# Patient Record
Sex: Female | Born: 1955 | Race: White | Hispanic: No | Smoking: Never smoker
Health system: Southern US, Community
[De-identification: ages and names within clinical notes are randomized; demographics above are authoritative.]

## PROBLEM LIST (undated history)

## (undated) DIAGNOSIS — E785 Hyperlipidemia, unspecified: Secondary | ICD-10-CM

## (undated) DIAGNOSIS — M545 Low back pain, unspecified: Secondary | ICD-10-CM

## (undated) DIAGNOSIS — E119 Type 2 diabetes mellitus without complications: Secondary | ICD-10-CM

## (undated) DIAGNOSIS — M79604 Pain in right leg: Secondary | ICD-10-CM

## (undated) DIAGNOSIS — M1990 Unspecified osteoarthritis, unspecified site: Secondary | ICD-10-CM

## (undated) DIAGNOSIS — R002 Palpitations: Secondary | ICD-10-CM

## (undated) DIAGNOSIS — I10 Essential (primary) hypertension: Secondary | ICD-10-CM

## (undated) DIAGNOSIS — K449 Diaphragmatic hernia without obstruction or gangrene: Secondary | ICD-10-CM

## (undated) DIAGNOSIS — Z9889 Other specified postprocedural states: Secondary | ICD-10-CM

## (undated) DIAGNOSIS — M549 Dorsalgia, unspecified: Secondary | ICD-10-CM

## (undated) DIAGNOSIS — T7840XA Allergy, unspecified, initial encounter: Secondary | ICD-10-CM

## (undated) DIAGNOSIS — K219 Gastro-esophageal reflux disease without esophagitis: Secondary | ICD-10-CM

## (undated) DIAGNOSIS — R112 Nausea with vomiting, unspecified: Secondary | ICD-10-CM

## (undated) DIAGNOSIS — M2550 Pain in unspecified joint: Secondary | ICD-10-CM

## (undated) HISTORY — DX: Essential (primary) hypertension: I10

## (undated) HISTORY — DX: Gastro-esophageal reflux disease without esophagitis: K21.9

## (undated) HISTORY — PX: COLONOSCOPY: SHX174

## (undated) HISTORY — DX: Dorsalgia, unspecified: M54.9

## (undated) HISTORY — DX: Low back pain: M54.5

## (undated) HISTORY — DX: Hyperlipidemia, unspecified: E78.5

## (undated) HISTORY — DX: Pain in unspecified joint: M25.50

## (undated) HISTORY — DX: Type 2 diabetes mellitus without complications: E11.9

## (undated) HISTORY — DX: Allergy, unspecified, initial encounter: T78.40XA

## (undated) HISTORY — DX: Palpitations: R00.2

---
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
## 1987-11-21 HISTORY — PX: TUBAL LIGATION: SHX77

## 1990-11-20 HISTORY — PX: BACK SURGERY: SHX140

## 1998-09-20 ENCOUNTER — Ambulatory Visit (HOSPITAL_COMMUNITY): Admission: RE | Admit: 1998-09-20 | Discharge: 1998-09-20 | Payer: Self-pay | Admitting: Family Medicine

## 1998-09-20 ENCOUNTER — Encounter: Payer: Self-pay | Admitting: Family Medicine

## 1998-11-20 HISTORY — PX: KNEE ARTHROSCOPY: SUR90

## 2001-04-23 ENCOUNTER — Other Ambulatory Visit: Admission: RE | Admit: 2001-04-23 | Discharge: 2001-04-23 | Payer: Self-pay | Admitting: *Deleted

## 2001-08-05 ENCOUNTER — Encounter: Admission: RE | Admit: 2001-08-05 | Discharge: 2001-11-03 | Payer: Self-pay | Admitting: Internal Medicine

## 2001-11-20 HISTORY — PX: CHOLECYSTECTOMY: SHX55

## 2002-04-10 ENCOUNTER — Encounter: Payer: Self-pay | Admitting: Family Medicine

## 2002-04-10 ENCOUNTER — Encounter: Admission: RE | Admit: 2002-04-10 | Discharge: 2002-04-10 | Payer: Self-pay | Admitting: Family Medicine

## 2002-07-08 ENCOUNTER — Encounter: Admission: RE | Admit: 2002-07-08 | Discharge: 2002-07-08 | Payer: Self-pay | Admitting: Internal Medicine

## 2002-07-08 ENCOUNTER — Encounter: Payer: Self-pay | Admitting: Internal Medicine

## 2002-08-07 ENCOUNTER — Encounter: Admission: RE | Admit: 2002-08-07 | Discharge: 2002-08-07 | Payer: Self-pay | Admitting: Family Medicine

## 2002-08-07 ENCOUNTER — Encounter: Payer: Self-pay | Admitting: Family Medicine

## 2004-10-05 ENCOUNTER — Encounter: Admission: RE | Admit: 2004-10-05 | Discharge: 2004-10-05 | Payer: Self-pay | Admitting: Allergy and Immunology

## 2004-10-16 ENCOUNTER — Observation Stay (HOSPITAL_COMMUNITY): Admission: EM | Admit: 2004-10-16 | Discharge: 2004-10-17 | Payer: Self-pay | Admitting: Emergency Medicine

## 2004-10-19 ENCOUNTER — Other Ambulatory Visit: Admission: RE | Admit: 2004-10-19 | Discharge: 2004-10-19 | Payer: Self-pay | Admitting: *Deleted

## 2004-12-29 ENCOUNTER — Observation Stay (HOSPITAL_COMMUNITY): Admission: RE | Admit: 2004-12-29 | Discharge: 2004-12-30 | Payer: Self-pay | Admitting: General Surgery

## 2004-12-29 ENCOUNTER — Encounter (INDEPENDENT_AMBULATORY_CARE_PROVIDER_SITE_OTHER): Payer: Self-pay | Admitting: *Deleted

## 2005-11-01 LAB — HM COLONOSCOPY

## 2005-12-04 ENCOUNTER — Other Ambulatory Visit: Admission: RE | Admit: 2005-12-04 | Discharge: 2005-12-04 | Payer: Self-pay | Admitting: *Deleted

## 2006-12-04 ENCOUNTER — Other Ambulatory Visit: Admission: RE | Admit: 2006-12-04 | Discharge: 2006-12-04 | Payer: Self-pay | Admitting: *Deleted

## 2007-12-18 ENCOUNTER — Other Ambulatory Visit: Admission: RE | Admit: 2007-12-18 | Discharge: 2007-12-18 | Payer: Self-pay | Admitting: *Deleted

## 2010-02-07 ENCOUNTER — Other Ambulatory Visit: Admission: RE | Admit: 2010-02-07 | Discharge: 2010-02-07 | Payer: Self-pay | Admitting: Family Medicine

## 2010-08-30 LAB — HM PAP SMEAR: HM Pap smear: NORMAL

## 2010-11-20 DIAGNOSIS — M79605 Pain in left leg: Secondary | ICD-10-CM

## 2010-11-20 DIAGNOSIS — M79604 Pain in right leg: Secondary | ICD-10-CM

## 2010-11-20 DIAGNOSIS — M545 Low back pain, unspecified: Secondary | ICD-10-CM

## 2010-11-20 HISTORY — DX: Pain in right leg: M79.604

## 2010-11-20 HISTORY — DX: Low back pain, unspecified: M54.50

## 2010-11-20 HISTORY — DX: Pain in left leg: M79.605

## 2011-03-16 LAB — HM MAMMOGRAPHY: HM Mammogram: NORMAL

## 2011-04-07 NOTE — Op Note (Signed)
Brianna Lambert, SILLIMAN NO.:  [id number]   MEDICAL RECORD NO.:  [id number]          PATIENT TYPE:  OBV   LOCATION:  0356                         FACILITY:  [facility]   PHYSICIAN:  Ollen Gross. Vernell Morgans, M.D. DATE OF BIRTH:  10/01/1956   DATE OF PROCEDURE:  12/29/2004  DATE OF DISCHARGE:  12/30/2004                                 OPERATIVE REPORT   PREOPERATIVE DIAGNOSIS:  Biliary dyskinesia.   POSTOPERATIVE DIAGNOSIS:  Cholecystitis.   OPERATION/PROCEDURE:  Laparoscopic cholecystectomy with intraoperative  cholangiogram.   SURGEON:  Ollen Gross. Carolynne Edouard, M.D.   ASSISTANT:  Anselm Pancoast. Zachery Dakins, M.D.   ANESTHESIA:  General endotracheal anesthesia.   DESCRIPTION OF PROCEDURE:  After informed consent was obtained, the patient  was brought to the operating room and placed in the supine position on the  operating room table.  After adequate induction of general anesthesia, the  patient's abdomen was prepped with Betadine and draped in the usual sterile  manner.  The area below the umbilicus was infiltrated with 0.25% Marcaine.   A small incision was made with the 15-blade knife.  This incision was  carried down through the subcutaneous tissue bluntly  with the Kelly clamp  and [employer] retractors until the linea alba was identified.  The linea  alba was incised with the 15-blade knife and each side was grasped with  Kocher clamps and elevated anteriorly.  The preperitoneal space was then  probed bluntly with a hemostat until the peritoneum was opened and access  was gained to the abdominal cavity.  A 0 Vicryl pursestring stitch was  placed in the fascia surrounding the opening. The Hasson cannula was placed  through the opening and anchored in place with the previously placed Vicryl  pursestring stitch.  The abdomen was then insufflated with carbon dioxide  without difficulty.  The patient was placed in the head-up position and  rotated slightly with the right side up.  The  epigastric region was then  infiltrated with 0.25% Marcaine.  A small incision was made with the 15-  blade knife and a 10 mm port was placed bluntly through this incision into  the abdominal cavity under direct vision.  Sites were then chosen laterally  on the right side of the abdomen with placement of a 5 mm ports.  Each of  these areas was infiltrated with 0.25% Marcaine.  Small stab incisions were  made with 15-blade knife and 5 mm ports were placed bluntly through these  incisions into the abdominal cavity under direct vision.  A blunt grasper  was placed through the lateral-most 5 mm port and used to grasp the dome of  the gallbladder and elevate it anteriorly and superiorly.  Some omental  adhesions to the body of the gallbladder was taken down bluntly with  dissector.  Another blunt grasper was placed in the other 5 mm port and used  to retract on the body and neck of the gallbladder.  The dissector was  placed through the epigastric port and using the electrocautery, the  peritoneal reflection at the gallbladder neck area was opened  sharply.  Blunt dissection was then carried out in this area until the gallbladder  neck-cystic duct junction was readily identified and a good window was  created.  A single clip was placed proximally on the gallbladder neck.  A  small ductotomy was made just below the clip.  A 14-gauge angiocath was  placed percutaneously through the anterior abdominal wall under direct  visualization.  A Reddick cholangiogram catheter was placed through the  angiocath and flushed.  The Reddick catheter was then placed within the  cystic duct and anchored in place with a clip.  A cholangiogram was obtained  that showed no filling defects, good emptying into the duodenum and adequate  length on the cystic duct.  The anchoring clip and catheters were then  removed from the patient.  Three clips were placed proximally on the cystic  duct and the duct was divided between  the two sets of clips.  Posterior to  this the cystic artery was identified and again dissected in a  circumferential manner bluntly until a good window was created.  Two clips  were placed proximally and one distally on the artery and the artery was  divided between the two.  Next, the laparoscopic hook cautery device was  used to separate the gallbladder from the liver bed.  Prior to completely  detaching the gallbladder from the liver bed, the liver bed was inspected  and several small bleeding points were coagulated with the electrocautery  until the area was completely hemostatic.  The gallbladder was then detached  the rest of the way from the liver bed without difficulty.  The laparoscopic  was then moved to the epigastric port and a gallbladder grasper was placed  through the Hasson cannula and used to grasp the neck of the gallbladder.  The gallbladder was then removed with the Hasson cannula through the  infraumbilical port without difficulty.  The fascial defect was closed with  the previously placed 0 Vicryl pursestring stitch as well as with another  interrupted 0 Vicryl stitch.  The abdomen was then irrigated with copious  amounts of saline until the effluent was clear.  The rest of the ports were  removed under direct vision and gas was allowed to escape.  The skin  incisions were all closed with interrupted 4-0 Monocryl subcuticular  stitches.  Benzoin and Steri-Strips and sterile dressings were applied.  The  patient tolerated the procedure well.  At the end of the case all needle,  sponge and instrument counts were correct.  The patient was then awakened  and taken to the recovery room in stable condition.      PST/MEDQ  D:  01/01/2005  T:  01/02/2005  Job:  [id number]

## 2011-04-07 NOTE — H&P (Signed)
Brianna Lambert, Brianna Lambert NO.:  [id number]   MEDICAL RECORD NO.:  [id number]          PATIENT TYPE:  INP   LOCATION:  1825                         FACILITY:  [facility]   PHYSICIAN:  Candyce Churn, M.D.DATE OF BIRTH:  1956/10/22   DATE OF ADMISSION:  10/16/2004  DATE OF DISCHARGE:                                HISTORY & PHYSICAL   CHIEF COMPLAINT:  Abdominal pain and right flank pain.   HISTORY OF PRESENT ILLNESS:  The patient is a very pleasant 55 year old  female with a history of greater than 24 hours of lower abdominal right  flank pain, which has been quite severe.  She is admitted now for pain  control and further workup.  She has a history of ruptured ovarian cyst and  also has had a renal calculous in the past.  She had an abdominal  ultrasound, which showed a fatty liver but otherwise was relatively  unrevealing.  She initially had pain in her right upper quadrant, but now it  is more centered in the right lower quadrant.  She denies any obvious chills  but thinks that she may have had some mild fever.  She has not had any gross  hematuria.  She just can't get comfortable.  She has been in the emergency  room now for 6 to 8 hours.   PAST MEDICAL HISTORY:  1.  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 Lacretia Nicks.  2.  Asthma.  3.  GERD.  4.  Obesity.  5.  History of ovarian cyst with rupture.  6.  History of renal calculous times one.  7.  History of recent chest tightness syndrome with palpitations, being      worked up by Dr. Dewayne Hatch __________ .  Has a stress Cardiolite scheduled for      10/28/2004.   PAST SURGICAL HISTORY:  1.  C-section times two.  2.  Knee surgery.  3.  L5-S1 disk surgery per Dr. Renae Fickle.  4.  Tonsillectomy and adenoidectomy.  5.  Uterine prolapse surgery.   ALLERGIES:  No known allergies to medications.   MEDICATIONS:  1.  Singulair 10 mg a day.  2.  Zyrtec 10 mg a day.  3.  Nexium 40 mg a day.   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  Has two  children, ages 21 and 24 years old, who are healthy.  She does not smoke or  drink.   FAMILY HISTORY:  Atrial fibrillation in mother.  Father had aortic valve  replacement and diabetes mellitus.   REVIEW OF SYSTEMS:  Her flank pain is pleuritic on the right.  She has had  some nausea and has vomited a couple of times.  No diarrhea.  No chest pain,  shortness of breath, or chills.  The pain is not particularly positional.   PHYSICAL EXAMINATION:  GENERAL:  An obese female complaining of right flank  pain.  VITAL SIGNS:  Temperature 97.  Blood pressure 115/67.  Pulse 65 and regular.  Respiratory rate 16 and unlabored.  HEENT:  Atraumatic and normocephalic.  Oropharynx moist.  Tragal nerves  intact.  NECK:  Supple without JVD.  CHEST:  Clear to auscultation.  CARDIAC:  Regular rhythm.  No murmurs or gallops.  ABDOMEN:  Soft and tender.  Some mild tenderness in all quadrants diffusely  but particularly in the right lower quadrant.  No obvious rebound.  She is  obese.  Bowel sounds are positive.  The patient has had 2 mg of Dilaudid  times two and Toradol 30 mg IV times one at my examination.  EXTREMITIES:  Without cyanosis or edema.  There are no painful cords.  Negative Homan signs bilaterally.  NEUROLOGIC:  Alert and oriented times three.  Nonfocal.  Moves all  extremities well.  She is able to give an excellent history.   LABORATORY DATA:  White count 11,500, hemoglobin 12.7, platelet count  362,000, MCV 80, and 77% neutrophils on white cell differential.  D-dimer  within normal limits at 0.28.  LFTs normal.  Sodium 137, potassium 4.1,  chloride 107, bicarb 23, BUN 10, creatinine 0.8, glucose 109.  Urinalysis  with 40 ketones, otherwise negative.  Lipase normal at 25.  Abdominal  ultrasound reveals fatty liver; otherwise within normal limits.   ASSESSMENT:  A 55 year old female with abdominal pain that is more localized  now to  the right lower quadrant.  She also has some mild right flank pain  with inspiration.  This seems to be better with Toradol.  Differential  diagnosis includes appendicitis, ovarian cyst rupture, or renal calculous.  Doubt she has diverticulitis.   PLAN:  Treat with IV fluids.  Check abdominal and pelvic CT.  Will use  Toradol 60 mg IV q. 8h. p.r.n. and Dilaudid p.r.n.  Will continue clinical  follow up and consider surgical consult.      Robe   RNG/MEDQ  D:  10/16/2004  T:  10/16/2004  Job:  [id number]   cc:   Gretta Arab. Valentina Lucks, M.D.  [address] [place]  [place] [address]  Fax: [phone]

## 2011-09-13 ENCOUNTER — Ambulatory Visit (INDEPENDENT_AMBULATORY_CARE_PROVIDER_SITE_OTHER): Payer: 59 | Admitting: Internal Medicine

## 2011-09-13 ENCOUNTER — Encounter: Payer: Self-pay | Admitting: Internal Medicine

## 2011-09-13 VITALS — BP 118/74 | HR 75 | Temp 98.2°F | Resp 16 | Wt 278.0 lb

## 2011-09-13 DIAGNOSIS — IMO0002 Reserved for concepts with insufficient information to code with codable children: Secondary | ICD-10-CM

## 2011-09-13 DIAGNOSIS — Z01818 Encounter for other preprocedural examination: Secondary | ICD-10-CM

## 2011-09-13 DIAGNOSIS — M5416 Radiculopathy, lumbar region: Secondary | ICD-10-CM

## 2011-09-13 DIAGNOSIS — Z136 Encounter for screening for cardiovascular disorders: Secondary | ICD-10-CM

## 2011-09-13 DIAGNOSIS — Z23 Encounter for immunization: Secondary | ICD-10-CM

## 2011-09-13 NOTE — Patient Instructions (Signed)

## 2011-09-17 ENCOUNTER — Encounter: Payer: Self-pay | Admitting: Internal Medicine

## 2011-09-17 NOTE — Assessment & Plan Note (Signed)
Her EKG is normal and her exam is normal, I will check routine labs on her and let her surgeon know what I think her surgical risk is

## 2011-09-17 NOTE — Progress Notes (Signed)
  Subjective:    Patient ID: Brianna Lambert, female    DOB: 04-11-1956, 55 y.o.   MRN: [id number]  HPI New to me she needs to have a physical and a pre-op exam prior to undergoing L3L4 discectomy with Dr. Guinevere Scarlet. Other than low back pain she has no complaints today.    Review of Systems  Constitutional: Negative for fever, chills, diaphoresis, activity change, appetite change, fatigue and unexpected weight change.  HENT: Negative.   Eyes: Negative.   Respiratory: Negative for apnea, cough, choking, chest tightness, shortness of breath, wheezing and stridor.   Cardiovascular: Negative for chest pain, palpitations and leg swelling.  Gastrointestinal: Negative for nausea, vomiting, abdominal pain, diarrhea, constipation, blood in stool and anal bleeding.  Genitourinary: Negative.   Musculoskeletal: Positive for back pain. Negative for myalgias, joint swelling, arthralgias and gait problem.  Skin: Negative for color change, pallor, rash and wound.  Neurological: Negative.   Hematological: Negative for adenopathy. Does not bruise/bleed easily.  Psychiatric/Behavioral: Negative.        Objective:   Physical Exam  Vitals reviewed. Constitutional: She is oriented to person, place, and time. She appears well-developed and well-nourished. No distress.  HENT:  Head: Normocephalic and atraumatic.  Mouth/Throat: Oropharynx is clear and moist. No oropharyngeal exudate.  Eyes: Conjunctivae are normal. Right eye exhibits no discharge. Left eye exhibits no discharge. No scleral icterus.  Neck: Normal range of motion. Neck supple. No JVD present. No tracheal deviation present. No thyromegaly present.  Cardiovascular: Normal rate, regular rhythm, normal heart sounds and intact distal pulses.  Exam reveals no gallop and no friction rub.   No murmur heard. Pulmonary/Chest: Effort normal and breath sounds normal. No stridor. No respiratory distress. She has no wheezes. She has no rales. She exhibits  no tenderness.  Abdominal: Soft. Bowel sounds are normal. She exhibits no distension. There is no tenderness. There is no rebound and no guarding.  Musculoskeletal: Normal range of motion. She exhibits no edema and no tenderness.  Lymphadenopathy:    She has no cervical adenopathy.  Neurological: She is oriented to person, place, and time. She displays normal reflexes. She exhibits normal muscle tone. Coordination normal.  Skin: Skin is warm and dry. No rash noted. She is not diaphoretic. No erythema. No pallor.  Psychiatric: She has a normal mood and affect. Her behavior is normal. Judgment and thought content normal.          Assessment & Plan:

## 2011-09-18 ENCOUNTER — Other Ambulatory Visit (INDEPENDENT_AMBULATORY_CARE_PROVIDER_SITE_OTHER): Payer: 59

## 2011-09-18 ENCOUNTER — Encounter: Payer: Self-pay | Admitting: Internal Medicine

## 2011-09-18 DIAGNOSIS — M5416 Radiculopathy, lumbar region: Secondary | ICD-10-CM

## 2011-09-18 DIAGNOSIS — Z01818 Encounter for other preprocedural examination: Secondary | ICD-10-CM

## 2011-09-18 DIAGNOSIS — IMO0002 Reserved for concepts with insufficient information to code with codable children: Secondary | ICD-10-CM

## 2011-09-18 LAB — CBC WITH DIFFERENTIAL/PLATELET
Basophils Absolute: 0 10*3/uL (ref 0.0–0.1)
Eosinophils Absolute: 0.1 10*3/uL (ref 0.0–0.7)
Lymphocytes Relative: 23.8 % (ref 12.0–46.0)
MCHC: 34.3 g/dL (ref 30.0–36.0)
MCV: 89.2 fl (ref 78.0–100.0)
Monocytes Absolute: 0.8 10*3/uL (ref 0.1–1.0)
Neutrophils Relative %: 61.3 % (ref 43.0–77.0)
Platelets: 248 10*3/uL (ref 150.0–400.0)
RBC: 4.76 Mil/uL (ref 3.87–5.11)
RDW: 13.6 % (ref 11.5–14.6)

## 2011-09-18 LAB — COMPREHENSIVE METABOLIC PANEL
ALT: 22 U/L (ref 0–35)
AST: 23 U/L (ref 0–37)
Albumin: 3.7 g/dL (ref 3.5–5.2)
Alkaline Phosphatase: 50 U/L (ref 39–117)
BUN: 17 mg/dL (ref 6–23)
Calcium: 8.9 mg/dL (ref 8.4–10.5)
Chloride: 105 mEq/L (ref 96–112)
Potassium: 4.2 mEq/L (ref 3.5–5.1)
Sodium: 139 mEq/L (ref 135–145)

## 2011-09-18 LAB — URINALYSIS, ROUTINE W REFLEX MICROSCOPIC
Bilirubin Urine: NEGATIVE
Nitrite: NEGATIVE
Total Protein, Urine: NEGATIVE
Urine Glucose: NEGATIVE
pH: 6 (ref 5.0–8.0)

## 2011-10-04 ENCOUNTER — Encounter: Payer: Self-pay | Admitting: Internal Medicine

## 2011-10-23 ENCOUNTER — Other Ambulatory Visit: Payer: Self-pay | Admitting: Orthopedic Surgery

## 2011-10-26 ENCOUNTER — Encounter (HOSPITAL_COMMUNITY): Payer: Self-pay

## 2011-10-27 ENCOUNTER — Encounter (HOSPITAL_COMMUNITY)
Admission: RE | Admit: 2011-10-27 | Discharge: 2011-10-27 | Disposition: A | Discharge status: Home / Self Care | Payer: 59 | Source: Ambulatory Visit | Attending: Orthopedic Surgery | Admitting: Orthopedic Surgery

## 2011-10-27 ENCOUNTER — Encounter (HOSPITAL_COMMUNITY): Payer: Self-pay

## 2011-10-27 ENCOUNTER — Other Ambulatory Visit: Payer: Self-pay

## 2011-10-27 HISTORY — DX: Nausea with vomiting, unspecified: R11.2

## 2011-10-27 HISTORY — DX: Low back pain, unspecified: M54.50

## 2011-10-27 HISTORY — DX: Low back pain: M54.5

## 2011-10-27 HISTORY — DX: Pain in right leg: M79.604

## 2011-10-27 HISTORY — DX: Nausea with vomiting, unspecified: Z98.890

## 2011-10-27 HISTORY — DX: Unspecified osteoarthritis, unspecified site: M19.90

## 2011-10-27 LAB — CBC
HCT: 43.2 % (ref 36.0–46.0)
Hemoglobin: 15 g/dL (ref 12.0–15.0)
MCV: 87.6 fL (ref 78.0–100.0)
RBC: 4.93 MIL/uL (ref 3.87–5.11)
RDW: 13.1 % (ref 11.5–15.5)
WBC: 10.5 10*3/uL (ref 4.0–10.5)

## 2011-10-27 LAB — COMPREHENSIVE METABOLIC PANEL
Alkaline Phosphatase: 61 U/L (ref 39–117)
BUN: 16 mg/dL (ref 6–23)
CO2: 28 mEq/L (ref 19–32)
Chloride: 99 mEq/L (ref 96–112)
Creatinine, Ser: 0.71 mg/dL (ref 0.50–1.10)
GFR calc Af Amer: >90 mL/min (ref >90)
GFR calc non Af Amer: >90 mL/min (ref >90)
Glucose, Bld: 91 mg/dL (ref 70–99)
Total Bilirubin: 0.5 mg/dL (ref 0.3–1.2)

## 2011-10-27 LAB — URINE MICROSCOPIC-ADD ON

## 2011-10-27 LAB — DIFFERENTIAL
Eosinophils Absolute: 0.2 10*3/uL (ref 0.0–0.7)
Lymphocytes Relative: 29 % (ref 12–46)
Lymphs Abs: 3 10*3/uL (ref 0.7–4.0)
Monocytes Absolute: 0.5 10*3/uL (ref 0.1–1.0)
Neutrophils Relative %: 63 % (ref 43–77)

## 2011-10-27 LAB — URINALYSIS, ROUTINE W REFLEX MICROSCOPIC
Bilirubin Urine: NEGATIVE
Glucose, UA: NEGATIVE mg/dL
Ketones, ur: NEGATIVE mg/dL
Nitrite: NEGATIVE
Specific Gravity, Urine: 1.012 (ref 1.005–1.030)
pH: 6.5 (ref 5.0–8.0)

## 2011-10-27 LAB — PROTIME-INR
INR: 0.97 (ref 0.00–1.49)
Prothrombin Time: 13.1 seconds (ref 11.6–15.2)

## 2011-10-27 LAB — ABO/RH: ABO/RH(D): A NEG

## 2011-10-27 NOTE — Pre-Procedure Instructions (Signed)
20 Brianna Lambert  10/27/2011   Your procedure is scheduled on:  Thurs, Dec 13  Report to [facility] at 0530 AM.  Call this number if you have problems the morning of surgery: [phone]   Remember:   Do not eat food:After Midnight.  May have clear liquids: up to 4 Hours before arrival.  Clear liquids include soda, tea, black coffee, apple or grape juice, broth.  Take these medicines the morning of surgery with A SIP OF WATER: *Zyrtec,Hydrcodone,Zegerid, inhaler if needed**   Do not wear jewelry, make-up or nail polish.  Do not wear lotions, powders, or perfumes. You may wear deodorant.  Do not shave 48 hours prior to surgery.  Do not bring valuables to the hospital.  Contacts, dentures or bridgework may not be worn into surgery.  Leave suitcase in the car. After surgery it may be brought to your room.  For patients admitted to the hospital, checkout time is 11:00 AM the day of discharge.   Patients discharged the day of surgery will not be allowed to drive home.  Name and phone number of your driver: n/a  Special Instructions: Incentive Spirometry - Practice and bring it with you on the day of surgery.Hibiclens shower the night before surgery and the morning of surgery.   Please read over the following fact sheets that you were given: Pain Booklet, Coughing and Deep Breathing, Blood Transfusion Information, MRSA Information and Surgical Site Infection Prevention

## 2011-11-01 MED ORDER — POVIDONE-IODINE 7.5 % EX SOLN
Freq: Once | CUTANEOUS | Status: DC
  Filled 2011-11-01: qty 118

## 2011-11-02 ENCOUNTER — Encounter (HOSPITAL_COMMUNITY): Payer: Self-pay

## 2011-11-02 ENCOUNTER — Inpatient Hospital Stay (HOSPITAL_COMMUNITY): Payer: 59

## 2011-11-02 ENCOUNTER — Encounter (HOSPITAL_COMMUNITY): Payer: Self-pay | Admitting: Certified Registered Nurse Anesthetist

## 2011-11-02 ENCOUNTER — Encounter (HOSPITAL_COMMUNITY)
Admission: RE | Disposition: A | Discharge status: Home Health | Payer: Self-pay | Source: Ambulatory Visit | Attending: Orthopedic Surgery

## 2011-11-02 ENCOUNTER — Inpatient Hospital Stay (HOSPITAL_COMMUNITY)
Admission: RE | Admit: 2011-11-02 | Discharge: 2011-11-05 | DRG: 460 | Disposition: A | Discharge status: Home Health | Payer: 59 | Source: Ambulatory Visit | Attending: Orthopedic Surgery | Admitting: Orthopedic Surgery

## 2011-11-02 ENCOUNTER — Inpatient Hospital Stay (HOSPITAL_COMMUNITY): Payer: 59 | Admitting: Certified Registered Nurse Anesthetist

## 2011-11-02 DIAGNOSIS — M129 Arthropathy, unspecified: Secondary | ICD-10-CM | POA: Diagnosis present

## 2011-11-02 DIAGNOSIS — J45909 Unspecified asthma, uncomplicated: Secondary | ICD-10-CM | POA: Diagnosis present

## 2011-11-02 DIAGNOSIS — K219 Gastro-esophageal reflux disease without esophagitis: Secondary | ICD-10-CM | POA: Diagnosis present

## 2011-11-02 DIAGNOSIS — Z01818 Encounter for other preprocedural examination: Secondary | ICD-10-CM

## 2011-11-02 DIAGNOSIS — K449 Diaphragmatic hernia without obstruction or gangrene: Secondary | ICD-10-CM | POA: Diagnosis present

## 2011-11-02 DIAGNOSIS — M5126 Other intervertebral disc displacement, lumbar region: Principal | ICD-10-CM | POA: Diagnosis present

## 2011-11-02 DIAGNOSIS — M5416 Radiculopathy, lumbar region: Secondary | ICD-10-CM

## 2011-11-02 DIAGNOSIS — M48061 Spinal stenosis, lumbar region without neurogenic claudication: Secondary | ICD-10-CM | POA: Diagnosis present

## 2011-11-02 HISTORY — DX: Diaphragmatic hernia without obstruction or gangrene: K44.9

## 2011-11-02 HISTORY — PX: POSTERIOR FUSION LUMBAR SPINE: SUR632

## 2011-11-02 SURGERY — POSTERIOR LUMBAR FUSION 3 LEVEL
Anesthesia: General | Site: Spine Lumbar | Laterality: Right | Wound class: Clean

## 2011-11-02 MED ORDER — ALUM & MAG HYDROXIDE-SIMETH 200-200-20 MG/5ML PO SUSP: 30.0000 mL | ORAL | Status: DC | PRN

## 2011-11-02 MED ORDER — MORPHINE SULFATE (PF) 1 MG/ML IV SOLN
INTRAVENOUS | Status: AC
  Filled 2011-11-02: qty 25

## 2011-11-02 MED ORDER — BUPIVACAINE-EPINEPHRINE 0.25% -1:200000 IJ SOLN
INTRAMUSCULAR | Status: DC | PRN
  Administered 2011-11-02: 8 mL

## 2011-11-02 MED ORDER — PROPOFOL 10 MG/ML IV EMUL
INTRAVENOUS | Status: DC | PRN
  Administered 2011-11-02: 160 mg via INTRAVENOUS
  Administered 2011-11-02: 40 mg via INTRAVENOUS
  Administered 2011-11-02: 50 mg via INTRAVENOUS

## 2011-11-02 MED ORDER — SODIUM CHLORIDE 0.9 % IV SOLN
10.0000 mg | INTRAVENOUS | Status: DC | PRN
  Administered 2011-11-02: 40 ug/min via INTRAVENOUS

## 2011-11-02 MED ORDER — PANTOPRAZOLE SODIUM 40 MG PO PACK
40.0000 mg | PACK | Freq: Every day | ORAL | Status: DC
  Filled 2011-11-02 (×2): qty 20

## 2011-11-02 MED ORDER — POTASSIUM CHLORIDE IN NACL 20-0.9 MEQ/L-% IV SOLN
INTRAVENOUS | Status: DC
  Administered 2011-11-02 – 2011-11-04 (×3): via INTRAVENOUS
  Filled 2011-11-02 (×10): qty 1000

## 2011-11-02 MED ORDER — ROCURONIUM BROMIDE 100 MG/10ML IV SOLN
INTRAVENOUS | Status: DC | PRN
  Administered 2011-11-02: 20 mg via INTRAVENOUS
  Administered 2011-11-02: 50 mg via INTRAVENOUS

## 2011-11-02 MED ORDER — CEFAZOLIN SODIUM 1-5 GM-% IV SOLN
INTRAVENOUS | Status: AC
  Filled 2011-11-02: qty 100

## 2011-11-02 MED ORDER — CEFAZOLIN SODIUM 1-5 GM-% IV SOLN
INTRAVENOUS | Status: AC
  Filled 2011-11-02: qty 50

## 2011-11-02 MED ORDER — SODIUM CHLORIDE 0.9 % IJ SOLN
3.0000 mL | Freq: Two times a day (BID) | INTRAMUSCULAR | Status: DC
  Administered 2011-11-02: 3 mL via INTRAVENOUS

## 2011-11-02 MED ORDER — ALUM & MAG HYDROXIDE-SIMETH 400-400-40 MG/5ML PO SUSP: 30.0000 mL | Freq: Four times a day (QID) | ORAL | Status: DC | PRN

## 2011-11-02 MED ORDER — DIPHENHYDRAMINE HCL 50 MG/ML IJ SOLN: 12.5000 mg | Freq: Four times a day (QID) | INTRAMUSCULAR | Status: DC | PRN

## 2011-11-02 MED ORDER — DIAZEPAM 5 MG PO TABS: 5.0000 mg | ORAL_TABLET | Freq: Four times a day (QID) | ORAL | Status: DC | PRN

## 2011-11-02 MED ORDER — LACTATED RINGERS IV SOLN
INTRAVENOUS | Status: DC | PRN
  Administered 2011-11-02 (×4): via INTRAVENOUS

## 2011-11-02 MED ORDER — THROMBIN 20000 UNITS EX KIT
PACK | CUTANEOUS | Status: DC | PRN
  Administered 2011-11-02: 20000 [IU] via TOPICAL

## 2011-11-02 MED ORDER — EPHEDRINE SULFATE 50 MG/ML IJ SOLN
INTRAMUSCULAR | Status: DC | PRN
  Administered 2011-11-02: 10 mg via INTRAVENOUS

## 2011-11-02 MED ORDER — SODIUM CHLORIDE 0.9 % IJ SOLN: 9.0000 mL | INTRAMUSCULAR | Status: DC | PRN

## 2011-11-02 MED ORDER — MORPHINE SULFATE 4 MG/ML IJ SOLN
2.0000 mg | INTRAMUSCULAR | Status: DC | PRN
  Administered 2011-11-03: 2 mg via INTRAVENOUS
  Filled 2011-11-02: qty 1

## 2011-11-02 MED ORDER — HYDROXYZINE HCL 50 MG PO TABS
50.0000 mg | ORAL_TABLET | ORAL | Status: DC | PRN
  Filled 2011-11-02: qty 1

## 2011-11-02 MED ORDER — SODIUM CHLORIDE 0.9 % IJ SOLN: 3.0000 mL | INTRAMUSCULAR | Status: DC | PRN

## 2011-11-02 MED ORDER — LORATADINE 10 MG PO TABS
10.0000 mg | ORAL_TABLET | Freq: Every day | ORAL | Status: DC
  Administered 2011-11-03 – 2011-11-05 (×3): 10 mg via ORAL
  Filled 2011-11-02 (×4): qty 1

## 2011-11-02 MED ORDER — SODIUM CHLORIDE 0.9 % IV SOLN: 250.0000 mL | INTRAVENOUS | Status: DC

## 2011-11-02 MED ORDER — ONDANSETRON HCL 4 MG/2ML IJ SOLN: 4.0000 mg | Freq: Four times a day (QID) | INTRAMUSCULAR | Status: DC | PRN

## 2011-11-02 MED ORDER — SODIUM CHLORIDE 0.9 % IV SOLN
INTRAVENOUS | Status: DC | PRN
  Administered 2011-11-02: 13:00:00 via INTRAVENOUS

## 2011-11-02 MED ORDER — ONDANSETRON HCL 4 MG/2ML IJ SOLN
INTRAMUSCULAR | Status: DC | PRN
  Administered 2011-11-02 (×2): 4 mg via INTRAVENOUS

## 2011-11-02 MED ORDER — MORPHINE SULFATE (PF) 1 MG/ML IV SOLN
INTRAVENOUS | Status: DC
  Administered 2011-11-02: 15:00:00 via INTRAVENOUS
  Administered 2011-11-02: 15.99 mg via INTRAVENOUS
  Administered 2011-11-02: 21:00:00 via INTRAVENOUS
  Administered 2011-11-03: 7 mg via INTRAVENOUS
  Administered 2011-11-03: 4 mg via INTRAVENOUS
  Administered 2011-11-03: 18 mg via INTRAVENOUS
  Filled 2011-11-02: qty 25

## 2011-11-02 MED ORDER — OMEPRAZOLE-SODIUM BICARBONATE 20-1100 MG PO CAPS: 1.0000 | ORAL_CAPSULE | Freq: Every day | ORAL | Status: DC

## 2011-11-02 MED ORDER — OXYCODONE-ACETAMINOPHEN 5-325 MG PO TABS
1.0000 | ORAL_TABLET | ORAL | Status: DC | PRN
  Administered 2011-11-03 – 2011-11-05 (×7): 1 via ORAL
  Administered 2011-11-05: 2 via ORAL
  Administered 2011-11-05: 1 via ORAL
  Filled 2011-11-02: qty 2
  Filled 2011-11-02 (×3): qty 1
  Filled 2011-11-02: qty 2
  Filled 2011-11-02 (×4): qty 1

## 2011-11-02 MED ORDER — NEOSTIGMINE METHYLSULFATE 1 MG/ML IJ SOLN
INTRAMUSCULAR | Status: DC | PRN
  Administered 2011-11-02: 3 mg via INTRAVENOUS

## 2011-11-02 MED ORDER — SODIUM CHLORIDE 0.9 % IR SOLN
Status: DC | PRN
  Administered 2011-11-02: 1000 mL

## 2011-11-02 MED ORDER — MIDAZOLAM HCL 5 MG/5ML IJ SOLN
INTRAMUSCULAR | Status: DC | PRN
  Administered 2011-11-02: 2 mg via INTRAVENOUS

## 2011-11-02 MED ORDER — THROMBIN 20000 UNITS EX KIT
PACK | OROMUCOSAL | Status: DC | PRN
  Administered 2011-11-02: 09:00:00 via TOPICAL

## 2011-11-02 MED ORDER — HYDROXYZINE HCL 50 MG/ML IM SOLN
50.0000 mg | INTRAMUSCULAR | Status: DC | PRN
  Filled 2011-11-02: qty 1

## 2011-11-02 MED ORDER — PHENOL 1.4 % MT LIQD
1.0000 | OROMUCOSAL | Status: DC | PRN
  Filled 2011-11-02: qty 177

## 2011-11-02 MED ORDER — DROPERIDOL 2.5 MG/ML IJ SOLN
INTRAMUSCULAR | Status: DC | PRN
  Administered 2011-11-02: 0.625 mg via INTRAVENOUS

## 2011-11-02 MED ORDER — CEFAZOLIN SODIUM 1-5 GM-% IV SOLN
INTRAVENOUS | Status: DC | PRN
  Administered 2011-11-02: 1 g via INTRAVENOUS
  Administered 2011-11-02: 2 g via INTRAVENOUS

## 2011-11-02 MED ORDER — LEVALBUTEROL TARTRATE 45 MCG/ACT IN AERO
1.0000 | INHALATION_SPRAY | RESPIRATORY_TRACT | Status: DC | PRN
  Filled 2011-11-02: qty 15

## 2011-11-02 MED ORDER — ZOLPIDEM TARTRATE 5 MG PO TABS: 5.0000 mg | ORAL_TABLET | Freq: Every evening | ORAL | Status: DC | PRN

## 2011-11-02 MED ORDER — GLYCOPYRROLATE 0.2 MG/ML IJ SOLN
INTRAMUSCULAR | Status: DC | PRN
  Administered 2011-11-02: .6 mg via INTRAVENOUS

## 2011-11-02 MED ORDER — CEFAZOLIN SODIUM 1-5 GM-% IV SOLN
1.0000 g | Freq: Three times a day (TID) | INTRAVENOUS | Status: AC
  Administered 2011-11-02 – 2011-11-03 (×2): 1 g via INTRAVENOUS
  Filled 2011-11-02 (×2): qty 50

## 2011-11-02 MED ORDER — DIPHENHYDRAMINE HCL 12.5 MG/5ML PO ELIX
12.5000 mg | ORAL_SOLUTION | Freq: Four times a day (QID) | ORAL | Status: DC | PRN
  Filled 2011-11-02: qty 5

## 2011-11-02 MED ORDER — HETASTARCH-ELECTROLYTES 6 % IV SOLN
INTRAVENOUS | Status: DC | PRN
  Administered 2011-11-02: 09:00:00 via INTRAVENOUS

## 2011-11-02 MED ORDER — MENTHOL 3 MG MT LOZG: 1.0000 | LOZENGE | OROMUCOSAL | Status: DC | PRN

## 2011-11-02 MED ORDER — ACETAMINOPHEN 325 MG PO TABS: 650.0000 mg | ORAL_TABLET | ORAL | Status: DC | PRN

## 2011-11-02 MED ORDER — PROPOFOL 10 MG/ML IV EMUL
INTRAVENOUS | Status: DC | PRN
  Administered 2011-11-02: 120 ug/kg/min via INTRAVENOUS

## 2011-11-02 MED ORDER — ACETAMINOPHEN 650 MG RE SUPP: 650.0000 mg | RECTAL | Status: DC | PRN

## 2011-11-02 MED ORDER — HYDROMORPHONE HCL PF 1 MG/ML IJ SOLN
0.2500 mg | INTRAMUSCULAR | Status: DC | PRN
  Administered 2011-11-02 (×2): 0.5 mg via INTRAVENOUS

## 2011-11-02 MED ORDER — FENTANYL CITRATE 0.05 MG/ML IJ SOLN
INTRAMUSCULAR | Status: DC | PRN
  Administered 2011-11-02: 25 ug via INTRAVENOUS
  Administered 2011-11-02: 50 ug via INTRAVENOUS
  Administered 2011-11-02: 25 ug via INTRAVENOUS
  Administered 2011-11-02: 150 ug via INTRAVENOUS
  Administered 2011-11-02 (×2): 25 ug via INTRAVENOUS
  Administered 2011-11-02: 50 ug via INTRAVENOUS

## 2011-11-02 MED ORDER — NALOXONE HCL 0.4 MG/ML IJ SOLN: 0.4000 mg | INTRAMUSCULAR | Status: DC | PRN

## 2011-11-02 SURGICAL SUPPLY — 79 items
APL SKNCLS STERI-STRIP NONHPOA (GAUZE/BANDAGES/DRESSINGS) ×1
BENZOIN TINCTURE PRP APPL 2/3 (GAUZE/BANDAGES/DRESSINGS) ×1 IMPLANT
BLADE SURG ROTATE 9660 (MISCELLANEOUS) ×1 IMPLANT
BUR ROUND PRECISION 4.0 (BURR) ×2 IMPLANT
CAGE CONCORDE BULLET 9X9X27 (Cage) ×1 IMPLANT
CARTRIDGE OIL MAESTRO DRILL (MISCELLANEOUS) ×1 IMPLANT
CLOTH BEACON ORANGE TIMEOUT ST (SAFETY) ×2 IMPLANT
CONT SPEC STER OR (MISCELLANEOUS) ×3 IMPLANT
CORDS BIPOLAR (ELECTRODE) ×2 IMPLANT
COVER SURGICAL LIGHT HANDLE (MISCELLANEOUS) ×2 IMPLANT
DIFFUSER DRILL AIR PNEUMATIC (MISCELLANEOUS) ×2 IMPLANT
DRAIN CHANNEL 15F RND FF W/TCR (WOUND CARE) ×2 IMPLANT
DRAPE C-ARM 42X72 X-RAY (DRAPES) ×2 IMPLANT
DRAPE ORTHO SPLIT 77X108 STRL (DRAPES) ×2
DRAPE POUCH INSTRU U-SHP 10X18 (DRAPES) ×2 IMPLANT
DRAPE SURG 17X23 STRL (DRAPES) ×8 IMPLANT
DRAPE SURG ORHT 6 SPLT 77X108 (DRAPES) ×1 IMPLANT
DRSG MEPILEX BORDER 4X12 (GAUZE/BANDAGES/DRESSINGS) IMPLANT
DRSG MEPILEX BORDER 4X8 (GAUZE/BANDAGES/DRESSINGS) IMPLANT
DURAPREP 26ML APPLICATOR (WOUND CARE) ×2 IMPLANT
ELECT BLADE 4.0 EZ CLEAN MEGAD (MISCELLANEOUS) ×2
ELECT CAUTERY BLADE 6.4 (BLADE) ×3 IMPLANT
ELECT REM PT RETURN 9FT ADLT (ELECTROSURGICAL) ×2
ELECTRODE BLDE 4.0 EZ CLN MEGD (MISCELLANEOUS) ×1 IMPLANT
ELECTRODE REM PT RTRN 9FT ADLT (ELECTROSURGICAL) ×1 IMPLANT
EVACUATOR SILICONE 100CC (DRAIN) ×2 IMPLANT
GAUZE SPONGE 4X4 16PLY XRAY LF (GAUZE/BANDAGES/DRESSINGS) ×6 IMPLANT
GLOVE BIO SURGEON STRL SZ8 (GLOVE) ×2 IMPLANT
GLOVE BIOGEL PI IND STRL 8 (GLOVE) ×1 IMPLANT
GLOVE BIOGEL PI INDICATOR 8 (GLOVE) ×1
GOWN PREVENTION PLUS XLARGE (GOWN DISPOSABLE) ×4 IMPLANT
GOWN STRL NON-REIN LRG LVL3 (GOWN DISPOSABLE) ×2 IMPLANT
IV CATH 14GX2 1/4 (CATHETERS) ×2 IMPLANT
KIT BASIN OR (CUSTOM PROCEDURE TRAY) ×2 IMPLANT
KIT POSITION SURG JACKSON T1 (MISCELLANEOUS) ×2 IMPLANT
KIT ROOM TURNOVER OR (KITS) ×2 IMPLANT
MARKER SKIN DUAL TIP RULER LAB (MISCELLANEOUS) ×4 IMPLANT
MIX DBX 10CC 35% BONE (Bone Implant) ×1 IMPLANT
NDL HYPO 25GX1X1/2 BEV (NEEDLE) ×1 IMPLANT
NDL SPNL 18GX3.5 QUINCKE PK (NEEDLE) ×2 IMPLANT
NEEDLE BONE MARROW 8GX6 FENEST (NEEDLE) IMPLANT
NEEDLE HYPO 25GX1X1/2 BEV (NEEDLE) ×2 IMPLANT
NEEDLE SPNL 18GX3.5 QUINCKE PK (NEEDLE) ×4 IMPLANT
NS IRRIG 1000ML POUR BTL (IV SOLUTION) ×4 IMPLANT
OIL CARTRIDGE MAESTRO DRILL (MISCELLANEOUS) ×2
PACK LAMINECTOMY ORTHO (CUSTOM PROCEDURE TRAY) ×2 IMPLANT
PACK UNIVERSAL I (CUSTOM PROCEDURE TRAY) ×2 IMPLANT
PAD ARMBOARD 7.5X6 YLW CONV (MISCELLANEOUS) ×4 IMPLANT
PATTIES SURGICAL .5 X1 (DISPOSABLE) ×2 IMPLANT
PATTIES SURGICAL .5 X3 (DISPOSABLE) ×1 IMPLANT
PATTIES SURGICAL .5X1.5 (GAUZE/BANDAGES/DRESSINGS) ×2 IMPLANT
PATTIES SURGICAL .75X.75 (GAUZE/BANDAGES/DRESSINGS) ×1 IMPLANT
ROD EXEDIUM PREBENT 5.5 40MM (Rod) ×2 IMPLANT
ROD EXEDIUM PREBENT 5.5X40 (Rod) IMPLANT
SCREW POLYAXIAL 7X45MM (Screw) ×4 IMPLANT
SCREW SET SINGLE INNER (Screw) ×4 IMPLANT
SPONGE GAUZE 4X4 12PLY (GAUZE/BANDAGES/DRESSINGS) ×1 IMPLANT
SPONGE INTESTINAL PEANUT (DISPOSABLE) ×2 IMPLANT
SPONGE SURGIFOAM ABS GEL 100 (HEMOSTASIS) ×1 IMPLANT
STRIP CLOSURE SKIN 1/2X4 (GAUZE/BANDAGES/DRESSINGS) IMPLANT
SURGIFLO TRUKIT (HEMOSTASIS) IMPLANT
SUT BONE WAX W31G (SUTURE) ×1 IMPLANT
SUT MNCRL AB 3-0 PS2 18 (SUTURE) ×2 IMPLANT
SUT MNCRL AB 4-0 PS2 18 (SUTURE) ×1 IMPLANT
SUT PROLENE 6 0 C 1 24 (SUTURE) IMPLANT
SUT VIC AB 0 CT1 18XCR BRD 8 (SUTURE) ×2 IMPLANT
SUT VIC AB 0 CT1 8-18 (SUTURE)
SUT VIC AB 1 CT1 18XCR BRD 8 (SUTURE) ×2 IMPLANT
SUT VIC AB 1 CT1 8-18 (SUTURE) ×2
SUT VIC AB 2-0 CT2 18 VCP726D (SUTURE) ×3 IMPLANT
SYR 20CC LL (SYRINGE) ×2 IMPLANT
SYR BULB IRRIGATION 50ML (SYRINGE) ×2 IMPLANT
SYR CONTROL 10ML LL (SYRINGE) ×2 IMPLANT
TAPE CLOTH SURG 4X10 WHT LF (GAUZE/BANDAGES/DRESSINGS) ×1 IMPLANT
TOWEL OR 17X24 6PK STRL BLUE (TOWEL DISPOSABLE) ×2 IMPLANT
TOWEL OR 17X26 10 PK STRL BLUE (TOWEL DISPOSABLE) ×2 IMPLANT
TRAY FOLEY CATH 14FR (SET/KITS/TRAYS/PACK) ×2 IMPLANT
WATER STERILE IRR 1000ML POUR (IV SOLUTION) ×2 IMPLANT
YANKAUER SUCT BULB TIP NO VENT (SUCTIONS) ×3 IMPLANT

## 2011-11-02 NOTE — Progress Notes (Signed)
CARE OF PT ASSUMED BY MASHAVER  RN 

## 2011-11-02 NOTE — Preoperative (Signed)
Beta Blockers   Reason not to administer Beta Blockers:Not Applicable 

## 2011-11-02 NOTE — Anesthesia Procedure Notes (Signed)
Procedure Name: Intubation Date/Time: 11/02/2011 7:31 AM Performed by: Shirlyn Goltz, TOM Pre-anesthesia Checklist: Patient identified, Emergency Drugs available, Suction available, Patient being monitored and Timeout performed Patient Re-evaluated:Patient Re-evaluated prior to inductionOxygen Delivery Method: Circle System Utilized Preoxygenation: Pre-oxygenation with 100% oxygen Intubation Type: IV induction Laryngoscope Size: Mac and 4 Grade View: Grade II Tube type: Oral Tube size: 7.5 mm Number of attempts: 1 Airway Equipment and Method: stylet Placement Confirmation: ETT inserted through vocal cords under direct vision,  positive ETCO2 and breath sounds checked- equal and bilateral Secured at: 21 cm Tube secured with: Tape Dental Injury: Teeth and Oropharynx as per pre-operative assessment

## 2011-11-02 NOTE — Anesthesia Postprocedure Evaluation (Signed)
Anesthesia Post Note  Patient: Brianna Lambert  Procedure(s) Performed:  POSTERIOR LUMBAR FUSION 3 LEVEL - RIGHT SIDE TRANSFORAMINAL INTERBODY FUSION L2-L5 WITH INSTRUMENTATION, VITOSS, AUTO GRAFT  Anesthesia type: General  Patient location: PACU  Post pain: Pain level controlled and Adequate analgesia  Post assessment: Post-op Vital signs reviewed, Patient's Cardiovascular Status Stable, Respiratory Function Stable, Patent Airway and Pain level controlled  Last Vitals:  Filed Vitals:   11/02/11 1540  BP:   Pulse: 62  Temp:   Resp: 16    Post vital signs: Reviewed and stable  Level of consciousness: awake, alert  and oriented  Complications: No apparent anesthesia complications

## 2011-11-02 NOTE — Anesthesia Preprocedure Evaluation (Addendum)
Anesthesia Evaluation  Patient identified by MRN, date of birth, ID band Patient awake    Reviewed: Allergy & Precautions, H&P , NPO status , Patient's Chart, lab work & pertinent test results  History of Anesthesia Complications (+) PONV  Airway Mallampati: II TM Distance: >3 FB Neck ROM: full    Dental  (+) Dental Advisory Given   Pulmonary asthma ,          Cardiovascular     Neuro/Psych    GI/Hepatic GERD-  Controlled,  Endo/Other    Renal/GU      Musculoskeletal   Abdominal   Peds  Hematology   Anesthesia Other Findings   Reproductive/Obstetrics                         Anesthesia Physical Anesthesia Plan  ASA: II  Anesthesia Plan: General   Post-op Pain Management:    Induction: Intravenous  Airway Management Planned: Oral ETT  Additional Equipment:   Intra-op Plan:   Post-operative Plan: Extubation in OR  Informed Consent: I have reviewed the patients History and Physical, chart, labs and discussed the procedure including the risks, benefits and alternatives for the proposed anesthesia with the patient or authorized representative who has indicated his/her understanding and acceptance.     Plan Discussed with: CRNA, Surgeon and Anesthesiologist  Anesthesia Plan Comments:        Anesthesia Quick Evaluation

## 2011-11-02 NOTE — Transfer of Care (Signed)
Immediate Anesthesia Transfer of Care Note  Patient: Brianna Lambert  Procedure(s) Performed:  POSTERIOR LUMBAR FUSION 3 LEVEL - RIGHT SIDE TRANSFORAMINAL INTERBODY FUSION L2-L5 WITH INSTRUMENTATION, VITOSS, AUTO GRAFT  Patient Location: PACU  Anesthesia Type: General  Level of Consciousness: awake, alert  and patient cooperative  Airway & Oxygen Therapy: Patient Spontanous Breathing and Patient connected to nasal cannula oxygen  Post-op Assessment: Report given to PACU RN  Post vital signs: Reviewed and stable  Complications: No apparent anesthesia complications

## 2011-11-02 NOTE — Progress Notes (Signed)
Report given to maryann shaver rn as caregiver 

## 2011-11-02 NOTE — H&P (Signed)
PREOPERATIVE H&P  Chief Complaint: right leg pain  HPI: Brianna Lambert is a 55 y.o. female who presents with right leg pain  Past Medical History  Diagnosis Date  . Allergy   . Low back pain radiating to both legs 2012    Dr. Electa Sniff  . GERD (gastroesophageal reflux disease)   . PONV (postoperative nausea and vomiting)   . Asthma     environmental allergies  . Arthritis     knees and spine  . Lumbar pain with radiation down right leg    Past Surgical History  Procedure Date  . Tonsillectomy   . Cesarean section   . Cholecystectomy 2003  . Back surgery 1992    lumbar lam  . Knee arthroscopy 2000    rt knee repair   History   Social History  . Marital Status: Widowed    Spouse Name: N/A    Number of Children: N/A  . Years of Education: N/A   Social History Main Topics  . Smoking status: Never Smoker   . Smokeless tobacco: Never Used  . Alcohol Use: No  . Drug Use: No  . Sexually Active: Not Currently   Other Topics Concern  . None   Social History Narrative  . None   Family History  Problem Relation Age of Onset  . Heart disease Father   . Diabetes Father   . Cancer Neg Hx   . Anesthesia problems Neg Hx   . Hypotension Neg Hx   . Malignant hyperthermia Neg Hx   . Pseudochol deficiency Neg Hx    No Known Allergies Prior to Admission medications   Medication Sig Start Date End Date Taking? Authorizing Provider  cetirizine (ZYRTEC) 10 MG tablet Take 10 mg by mouth daily.     Yes Historical Provider, MD  cyclobenzaprine (FLEXERIL) 5 MG tablet Take 5 mg by mouth 3 (three) times daily as needed. For muscle spasm.    Yes Historical Provider, MD  HYDROcodone-acetaminophen (NORCO) 5-325 MG per tablet Take 1 tablet by mouth every 6 (six) hours as needed.     Yes Historical Provider, MD  levalbuterol [facility] HFA) 45 MCG/ACT inhaler Inhale 1-2 puffs into the lungs every 4 (four) hours as needed. For shortness of breath    Yes Historical Provider, MD    Omeprazole-Sodium Bicarbonate (ZEGERID OTC PO) Take 1 tablet by mouth daily.     Yes Historical Provider, MD     All other systems have been reviewed and were otherwise negative with the exception of those mentioned in the HPI and as above.  Physical Exam: Filed Vitals:   11/02/11 0637  BP: 121/69  Pulse: 73  Temp: 98.1 F (36.7 C)  Resp: 20    General: Alert, no acute distress Cardiovascular: No pedal edema Respiratory: No cyanosis, no use of accessory musculature GI: No organomegaly, abdomen is soft and non-tender Skin: No lesions in the area of chief complaint Neurologic: Sensation intact distally Psychiatric: Patient is competent for consent with normal mood and affect Lymphatic: No axillary or cervical lymphadenopathy  MUSCULOSKELETAL: + TTP low back  Assessment/Plan: LUMBAR STENOSIS Plan for Procedure(s): POSTERIOR LUMBAR decompression L2-5, TLIF, L3-4 on right   Cheick Suhr LEONARD 11/02/2011 7:12 AM

## 2011-11-03 MED ORDER — OXYCODONE HCL 10 MG PO TB12
10.0000 mg | ORAL_TABLET | Freq: Two times a day (BID) | ORAL | Status: DC
  Administered 2011-11-03 – 2011-11-05 (×4): 10 mg via ORAL
  Filled 2011-11-03 (×4): qty 1

## 2011-11-03 MED ORDER — PANTOPRAZOLE SODIUM 40 MG PO TBEC
40.0000 mg | DELAYED_RELEASE_TABLET | Freq: Every day | ORAL | Status: DC
  Administered 2011-11-03 – 2011-11-05 (×3): 40 mg via ORAL
  Filled 2011-11-03 (×3): qty 1

## 2011-11-03 MED ORDER — ONDANSETRON HCL 4 MG/2ML IJ SOLN
4.0000 mg | Freq: Four times a day (QID) | INTRAMUSCULAR | Status: DC | PRN
  Administered 2011-11-03: 4 mg via INTRAVENOUS

## 2011-11-03 MED ORDER — ONDANSETRON HCL 4 MG/2ML IJ SOLN
INTRAMUSCULAR | Status: AC
  Filled 2011-11-03: qty 2

## 2011-11-03 NOTE — Progress Notes (Signed)
Physical Therapy Evaluation Patient Details Name: Brianna Lambert MRN: [id number] DOB: 1956/06/13 Today's Date: 11/03/2011  Problem List:  Patient Active Problem List  Diagnoses  . Pre-op examination  . Lumbar radiculitis    Past Medical History:  Past Medical History  Diagnosis Date  . Allergy   . Low back pain radiating to both legs 2012    Dr. Electa Sniff  . GERD (gastroesophageal reflux disease)   . PONV (postoperative nausea and vomiting)   . Asthma     environmental allergies  . Arthritis     knees and spine  . Lumbar pain with radiation down right leg   . Hiatal hernia    Past Surgical History:  Past Surgical History  Procedure Date  . Cesarean section 1986; 1989  . Cholecystectomy 2003  . Back surgery 1992    lumbar lam  . Knee arthroscopy 2000    rt knee repair  . Posterior fusion lumbar spine 11/02/11    right PLIF; L2-L5  . Tonsillectomy 1970's  . Tubal ligation 1989    PT Assessment/Plan/Recommendation PT Assessment Clinical Impression Statement: Pt presents with a medical diagnosis of L2-5 fusion along with the following impairments/deficits and therapy diagnosis listed below. Pt will benefit from skilled PT in the acute care setting in order to maximize functional mobility for a safe d/c home. PT Recommendation/Assessment: Patient will need skilled PT in the acute care venue PT Problem List: Decreased activity tolerance;Decreased mobility;Decreased safety awareness;Pain;Decreased knowledge of precautions PT Therapy Diagnosis : Acute pain PT Plan PT Frequency: Min 5X/week PT Treatment/Interventions: DME instruction;Gait training;Stair training;Functional mobility training;Therapeutic activities;Therapeutic exercise;Patient/family education PT Recommendation Follow Up Recommendations: Home health PT;24 hour supervision/assistance Equipment Recommended: 3 in 1 bedside comode;Rolling walker with 5" wheels;Other (comment) PT Goals  Acute Rehab PT  Goals PT Goal Formulation: With patient Time For Goal Achievement: 7 days Pt will go Supine/Side to Sit: with min assist PT Goal: Supine/Side to Sit - Progress: Progressing toward goal Pt will go Sit to Supine/Side: with min assist PT Goal: Sit to Supine/Side - Progress: Progressing toward goal Pt will go Sit to Stand: with modified independence PT Goal: Sit to Stand - Progress: Progressing toward goal Pt will go Stand to Sit: with modified independence PT Goal: Stand to Sit - Progress: Progressing toward goal Pt will Transfer Bed to Chair/Chair to Bed: with modified independence PT Transfer Goal: Bed to Chair/Chair to Bed - Progress: Progressing toward goal Pt will Ambulate: >150 feet;with modified independence;with least restrictive assistive device PT Goal: Ambulate - Progress: Progressing toward goal Pt will Go Up / Down Stairs: 1-2 stairs;with rolling walker;with min assist PT Goal: Up/Down Stairs - Progress: Other (comment) (unassessed today)  PT Evaluation Precautions/Restrictions  Precautions Precautions: Back Restrictions Weight Bearing Restrictions: No Prior Functioning  Home Living Lives With: Sheran Spine Help From: Family;Friend(s) Type of Home: House Home Layout: One level Home Access: Stairs to enter Entrance Stairs-Rails: None Entrance Stairs-Number of Steps: 2 Bathroom Shower/Tub: Psychologist, counselling;Door [employer] Toilet: Standard Bathroom Accessibility: Yes How Accessible: Accessible via walker Home Adaptive Equipment: Shower chair with back Prior Function Level of Independence: Independent with basic ADLs;Independent with homemaking with ambulation;Independent with gait;Independent with transfers Able to Take Stairs?: Yes Driving: Yes Vocation: Full time employment Cognition Cognition Arousal/Alertness: Awake/alert Overall Cognitive Status: Appears within functional limits for tasks assessed Orientation Level: Oriented  X4 Sensation/Coordination Sensation Light Touch: Appears Intact Coordination Gross Motor Movements are Fluid and Coordinated: Yes Fine Motor Movements are Fluid and Coordinated: Yes Extremity  Assessment RUE Assessment RUE Assessment: Within Functional Limits LUE Assessment LUE Assessment: Within Functional Limits RLE Assessment RLE Assessment: Within Functional Limits LLE Assessment LLE Assessment: Within Functional Limits Mobility (including Balance) Bed Mobility Bed Mobility: Yes Rolling Left: 3: Mod assist;With rail Rolling Left Details (indicate cue type and reason): VC for sequencing and hand placement in order to log roll onto side; VCs in order to maintain back precautions Left Sidelying to Sit: 2: Max assist;With rails Left Sidelying to Sit Details (indicate cue type and reason): VCs for sequencing as well as assist with bilateral LEs and trunk control into sitting.  Sitting - Scoot to Edge of Bed: 3: Mod assist;With rail Sitting - Scoot to [place] of Bed Details (indicate cue type and reason): VCs for hand placement. Assist with drawsheet secondary to pt with difficulty scooting forward. Transfers Transfers: Yes Sit to Stand: 3: Mod assist;With upper extremity assist;From bed Sit to Stand Details (indicate cue type and reason): VCs for hand placement and safety to RW Stand to Sit: 4: Min assist;With upper extremity assist;To chair/3-in-1 Stand to Sit Details: VCs for hand placement. Pt with slow controlled descent into sitting. Stand Pivot Transfers: 4: Min assist Stand Pivot Transfer Details (indicate cue type and reason): VCs for transfer from bed to chair with RW. Pt with small steps but was able to complete with min assist. VCs to maintain back precaution with turning. Ambulation/Gait Ambulation/Gait: No (brace not in room yet)    Exercise    End of Session PT - End of Session Equipment Utilized During Treatment: Gait belt Activity Tolerance: Patient tolerated  treatment well Patient left: in chair;with call bell in reach;with family/visitor present Nurse Communication: Mobility status for transfers;Mobility status for ambulation General Behavior During Session: [facility] for tasks performed Cognition: [facility] for tasks performed  Milana Kidney 11/03/2011, 1:24 PM  11/03/2011 Milana Kidney DPT PAGER: [phone] OFFICE: [phone]

## 2011-11-03 NOTE — Progress Notes (Signed)
Occupational Therapy Evaluation Patient Details Name: Brianna Lambert MRN: [id number] DOB: Jan 26, 1956 Today's Date: 11/03/2011  Problem List:  Patient Active Problem List  Diagnoses  . Pre-op examination  . Lumbar radiculitis    Past Medical History:  Past Medical History  Diagnosis Date  . Allergy   . Low back pain radiating to both legs 2012    Dr. Electa Sniff  . GERD (gastroesophageal reflux disease)   . PONV (postoperative nausea and vomiting)   . Asthma     environmental allergies  . Arthritis     knees and spine  . Lumbar pain with radiation down right leg   . Hiatal hernia    Past Surgical History:  Past Surgical History  Procedure Date  . Cesarean section 1986; 1989  . Cholecystectomy 2003  . Back surgery 1992    lumbar lam  . Knee arthroscopy 2000    rt knee repair  . Posterior fusion lumbar spine 11/02/11    right PLIF; L2-L5  . Tonsillectomy 1970's  . Tubal ligation 1989    OT Assessment/Plan/Recommendation OT Assessment Clinical Impression Statement: 55 yo female s/p TLIF L3-4 decr mobility decr independence with ADLs that could benefit from OT to maxmizie independence for d/c hom OT Recommendation/Assessment: Patient will need skilled OT in the acute care venue OT Problem List: Decreased activity tolerance;Impaired balance (sitting and/or standing);Decreased knowledge of use of DME or AE;Decreased knowledge of precautions;Pain OT Therapy Diagnosis : Generalized weakness;Acute pain OT Plan OT Frequency: Min 2X/week OT Treatment/Interventions: Self-care/ADL training;DME and/or AE instruction;Therapeutic activities;Balance training;Patient/family education OT Recommendation Follow Up Recommendations: None Equipment Recommended: 3 in 1 bedside comode;Rolling walker with 5" wheels;Other (comment) (baratric RW due to width of back brace) Individuals Consulted Consulted and Agree with Results and Recommendations: Patient OT Goals Acute Rehab OT Goals OT  Goal Formulation: With patient Time For Goal Achievement: 7 days ADL Goals Pt Will Perform Lower Body Dressing: with modified independence;with adaptive equipment;Supported;Sitting, chair Pt Will Transfer to Toilet: with modified independence;3-in-1 Pt Will Perform Toileting - Clothing Manipulation: with modified independence;Sitting on 3-in-1 or toilet Pt Will Perform Toileting - Hygiene: with modified independence;Sit to stand from 3-in-1/toilet Miscellaneous OT Goals Miscellaneous OT Goal #1: Pt will perform bed mobility without rail and hob <20 degrees S level as precursor to adls  OT Evaluation Precautions/Restrictions  Precautions Precautions: Back Restrictions Weight Bearing Restrictions: No Prior Functioning Home Living Lives With: Sheran Spine Help From: Family;Friend(s) Type of Home: House Home Layout: One level Home Access: Stairs to enter Entrance Stairs-Rails: None Entrance Stairs-Number of Steps: 2 Bathroom Shower/Tub: Psychologist, counselling;Door [employer] Toilet: Standard Bathroom Accessibility: Yes How Accessible: Accessible via walker Home Adaptive Equipment: Shower chair with back Prior Function Level of Independence: Independent with basic ADLs;Independent with homemaking with ambulation;Independent with gait;Independent with transfers Able to Take Stairs?: Yes Driving: Yes Vocation: Full time employment ADL ADL Eating/Feeding: Performed;Modified independent Where Assessed - Eating/Feeding: Chair Grooming: Performed;Teeth care;Brushing hair;Wash/dry face;Supervision/safety Where Assessed - Grooming: Standing at sink;Unsupported Toilet Transfer: Mining engineer Method: Stand pivot;Ambulating Toilet Transfer Equipment: Other (comment) (BIL UE used chair<>stand) Equipment Used: Rolling walker;Other (comment) (pt provided wider RW) ADL Comments: Pt provided wider RW due to inability to position self inside RW with brace. Pt nauseated but  willing to participate. Pt pleasant and verbalized 2 out 3 precautions. pt's friend that will be caregiver Aing with 3rd precaution. Pt educated on ADLs with precautions. Vision/Perception    Cognition Cognition Arousal/Alertness: Awake/alert Overall Cognitive Status: Appears within functional  limits for tasks assessed Orientation Level: Oriented X4 Sensation/Coordination Sensation Light Touch: Appears Intact Coordination Gross Motor Movements are Fluid and Coordinated: Yes Fine Motor Movements are Fluid and Coordinated: Yes Extremity Assessment RUE Assessment RUE Assessment: Within Functional Limits LUE Assessment LUE Assessment: Within Functional Limits Mobility  Bed Mobility Bed Mobility: No Transfers Transfers: Yes Sit to Stand: 4: Min assist;With upper extremity assist;From chair/3-in-1 Stand to Sit: 4: Min assist;With upper extremity assist;To chair/3-in-1 Exercises   End of Session OT - End of Session Equipment Utilized During Treatment: Back brace Activity Tolerance: Patient tolerated treatment well Patient left: in chair;with call bell in reach;with family/visitor present Nurse Communication: Mobility status for ambulation;Mobility status for transfers General Behavior During Session: [facility] for tasks performed Cognition: [facility] for tasks performed  Next session: bed mobility, LB Ae education and toilet transfer with 3N1   Lucile Shutters 11/03/2011, 12:05 PM  Pager: [phone]

## 2011-11-03 NOTE — Progress Notes (Signed)
Pt comfortable  AVSS NVI Dressing CDI JP output: 60  Pt POD #1 after L2-5 decompression, L3-4 TLIF  - d/c pca - up with PT once brace arrives - percocet, valium - d/c drain tomorrow - likely d/c home Saturday vs. sunday

## 2011-11-03 NOTE — Progress Notes (Signed)
Patient referred to SW; met patient. She denies any SW needs at this time. May need home health/DME at d/c. Case management would assist with this. Social worker signing off.  Darylene Price, BSW, 11/03/2011 4:57 PM
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
## 2011-11-04 MED ORDER — SENNOSIDES-DOCUSATE SODIUM 8.6-50 MG PO TABS
1.0000 | ORAL_TABLET | Freq: Every evening | ORAL | Status: DC | PRN
  Administered 2011-11-04: 1 via ORAL
  Filled 2011-11-04: qty 1

## 2011-11-04 MED ORDER — DOCUSATE SODIUM 100 MG PO CAPS
100.0000 mg | ORAL_CAPSULE | Freq: Two times a day (BID) | ORAL | Status: DC | PRN
  Administered 2011-11-04 – 2011-11-05 (×2): 100 mg via ORAL
  Filled 2011-11-04 (×2): qty 1

## 2011-11-04 NOTE — Progress Notes (Signed)
PATIENT ID: SEMAYA VIDA  MRN: [id number]  DOB/AGE:  03-11-1956 / 55 y.o.  2 Days Post-Op Procedure(s) (LRB): POSTERIOR LUMBAR FUSION 3 LEVEL (Right)    PROGRESS NOTE Subjective: Patient is alert, oriented, no Nausea, no Vomiting, yes passing gas, no Bowel Movement. Taking PO well. Denies SOB, Chest or Calf Pain. Using [employer]. Ambulating slowly. Patient reports pain as moderate  .    Objective: Vital signs in last 24 hours: Filed Vitals:   11/03/11 0807 11/03/11 1400 11/03/11 2118 11/04/11 0537  BP:  111/70 104/58 108/53  Pulse:  69 64 83  Temp:  98.9 F (37.2 C) 99.5 F (37.5 C) 99 F (37.2 C)  TempSrc:      Resp: 18 16 18 20   Height:      Weight:      SpO2: 99% 96% 95% 94%      Intake/Output from previous day: I/O last 3 completed shifts: In: 1214 [I.V.:1212; IV Piggyback:2] Out: 2610 [Urine:2400; Drains:210]   Intake/Output this shift:     LABORATORY DATA: No results found for this basename: WBC:2,HGB:2,HCT:2,PLT:2,NA:2,K:2,CL:2,CO2:2,BUN:2,CREATININE:2,GLUCOSE:2,GLUCAP:3,PT:2,INR:2,CALCIUM,2 in the last 72 hours  Examination: Neurologically intact ABD soft Neurovascular intact Sensation intact distally Dorsiflexion/Plantar flexion intact Incision: dressing C/D/I} - drain pulled, output was 50  Assessment:   2 Days Post-Op Procedure(s) (LRB): POSTERIOR LUMBAR FUSION 3 LEVEL (Right) ADDITIONAL DIAGNOSIS:  none  Plan: PT/OT WBAT, CPM 5/hrs day until ROM 0-90 degrees, then D/C CPM DVT Prophylaxis:  Lovenox\Coumadin bridge target INR 1.5-2.0 DISCHARGE PLAN: Home Sunday DISCHARGE NEEDS: HHPT and Epifanio Lesches M. 11/04/2011, 8:34 AM

## 2011-11-04 NOTE — Progress Notes (Signed)
Physical Therapy Treatment Patient Details Name: GENEVEIVE FURNESS MRN: [id number] DOB: [birth year] Today's Date: 11/04/2011  PT Assessment/Plan  PT - Assessment/Plan Comments on Treatment Session: Patient s/p PLIF progressing well today.  Able to incr. distance with ambulation and up and down stairs.  Patient and daughter report that she will have 24 hour care as she will need it to apply brace and for other mobility activities.  Home with HHPT appropriate.  Will need wide RW and wide 3N1.   PT Plan: Discharge plan remains appropriate;Frequency remains appropriate Follow Up Recommendations: Home health PT;24 hour supervision/assistance Equipment Recommended: Rolling walker with 5" wheels;3 in 1 bedside comode (*Needs wide equipment) PT Goals  Acute Rehab PT Goals PT Goal Formulation: With patient PT Goal: Supine/Side to Sit - Progress: Progressing toward goal PT Goal: Sit to Supine/Side - Progress: Progressing toward goal PT Goal: Sit to Stand - Progress: Progressing toward goal PT Goal: Stand to Sit - Progress: Progressing toward goal PT Transfer Goal: Bed to Chair/Chair to Bed - Progress: Progressing toward goal PT Goal: Ambulate - Progress: Progressing toward goal PT Goal: Up/Down Stairs - Progress: Met  PT Treatment Precautions/Restrictions  Precautions Precautions: Back Precaution Booklet Issued: Yes (comment) Required Braces or Orthoses: Yes Spinal Brace: Thoracolumbosacral orthotic;Applied in standing position (with right leg extension) Restrictions Weight Bearing Restrictions: No Mobility (including Balance) Bed Mobility Rolling Left: 4: Min assist;With rail Rolling Left Details (indicate cue type and reason): cues for technique of log roll. Left Sidelying to Sit: 4: Min assist;With rails;HOB elevated (comment degrees) (HOB at 10 degrees) Supine to Sit: Not tested (comment) Sitting - Scoot to Edge of Bed: 5: Supervision Sitting - Scoot to Edge of Bed Details (indicate cue  type and reason): Pt. able to do independently, cues for back precautions and reciprocal scooting.  Total assist donning brace in standing. Transfers Sit to Stand: 1: +2 Total assist;Patient percentage (comment);With upper extremity assist;From bed (Pt = 70%) Sit to Stand Details (indicate cue type and reason): Verbal cues for hand placement, at times needed assist locking and unlocking knee piece on right.  Pt. wanting to pull up on RW as well instead of using proper technique. Stand to Sit: 4: Min assist;With upper extremity assist;With armrests;To chair/3-in-1;To toilet Stand to Sit Details: verbal cues for hand placement.  Able to control descent into sitting without problems.   Stand Pivot Transfers: Not tested (comment) Ambulation/Gait Ambulation/Gait: Yes Ambulation/Gait Assistance: 4: Min assist (guard assist ) Ambulation/Gait Assistance Details (indicate cue type and reason): occasional verbal cues for staying close to RW but overall is not very dependent on RW.  Good safety with RW. Ambulation Distance (Feet): 400 Feet Assistive device: Rolling walker Gait Pattern: Step-through pattern;Decreased stride length;Decreased weight shift to right;Right hip hike (right knee locked in extension with ambulation) Gait velocity: Slow cadence Stairs: Yes Stairs Assistance: 4: Min assist Stairs Assistance Details (indicate cue type and reason): verbal cues for technique and pt. needed assistance to move the RW as well.  Daughter present and observed steps and how to help pt.  Gave handout as well.   Stair Management Technique: No rails;Step to pattern;Backwards;With walker Number of Stairs: 2  Height of Stairs: 4  Wheelchair Mobility Wheelchair Mobility: No  Posture/Postural Control Posture/Postural Control: No significant limitations Balance Balance Assessed: No Exercise    End of Session PT - End of Session Equipment Utilized During Treatment: Gait belt;Back brace Activity Tolerance:  Patient tolerated treatment well Patient left: in chair;with call bell in reach;with  family/visitor present Nurse Communication: Mobility status for transfers;Mobility status for ambulation General Behavior During Session: [facility] for tasks performed Cognition: [facility] for tasks performed  INGOLD,Brunilda Eble 11/04/2011, 3:59 PM [facility] [phone] (pager)

## 2011-11-05 DIAGNOSIS — M48061 Spinal stenosis, lumbar region without neurogenic claudication: Secondary | ICD-10-CM | POA: Diagnosis present

## 2011-11-05 MED ORDER — OXYCODONE HCL 10 MG PO TB12: 10.0000 mg | ORAL_TABLET | Freq: Two times a day (BID) | ORAL | Status: AC

## 2011-11-05 MED ORDER — OXYCODONE-ACETAMINOPHEN 5-325 MG PO TABS: 1.0000 | ORAL_TABLET | ORAL | Status: AC | PRN

## 2011-11-05 MED ORDER — DIAZEPAM 5 MG PO TABS: 5.0000 mg | ORAL_TABLET | Freq: Four times a day (QID) | ORAL | Status: AC | PRN

## 2011-11-05 NOTE — Progress Notes (Signed)
Physical Therapy Treatment Patient Details Name: Brianna Lambert MRN: [id number] DOB: 1956/10/21 Today's Date: 11/05/2011  Patient politely declining therapy today due to she has been discharge and feels comfortable with mobility education. She has no questions at this time. Nursing to page PT if she does develop questions.    Sallyanne Kuster 11/05/2011, 2:17 PM  Sallyanne Kuster, PTA Office- [phone]

## 2011-11-05 NOTE — Progress Notes (Signed)
PATIENT ID: Brianna Lambert  MRN: [id number]  DOB/AGE:  1956-07-09 / 55 y.o.  3 Days Post-Op Procedure(s) (LRB): POSTERIOR LUMBAR FUSION 3 LEVEL (Right)    PROGRESS NOTE Subjective: Patient is alert, oriented,noNausea, no Vomiting, yes passing gas, no Bowel Movement. Taking PO well. Denies SOB, Chest or Calf Pain. Using Incentive Spirometer, PAS in place. Ambulating well with PT. Patient reports pain as mild  .    Objective: Vital signs in last 24 hours: Filed Vitals:   11/04/11 0537 11/04/11 1400 11/04/11 2124 11/05/11 0526  BP: 108/53 110/60 126/54 110/42  Pulse: 83 74 68 61  Temp: 99 F (37.2 C) 98.7 F (37.1 C) 98.1 F (36.7 C) 98.3 F (36.8 C)  TempSrc:  Oral Oral Oral  Resp: 20 20 18 18   Height:      Weight:      SpO2: 94% 95% 94% 96%      Intake/Output from previous day: I/O last 3 completed shifts: In: 1067 [P.O.:275; I.V.:792] Out: -    Intake/Output this shift: Total I/O In: 125 [P.O.:125] Out: -    LABORATORY DATA: No results found for this basename: WBC:2,HGB:2,HCT:2,PLT:2,NA:2,K:2,CL:2,CO2:2,BUN:2,CREATININE:2,GLUCOSE:2,GLUCAP:3,PT:2,INR:2,CALCIUM,2: in the last 72 hours  Examination: Neurologically intact ABD soft Neurovascular intact Sensation intact distally Incision: dressing C/D/I}  Assessment:   3 Days Post-Op Procedure(s) (LRB): POSTERIOR LUMBAR FUSION 3 LEVEL (Right) ADDITIONAL DIAGNOSIS:  none  Plan:  DISCHARGE PLAN: Home today  DISCHARGE NEEDS: Walker and 3-in-1 comode seat

## 2011-11-05 NOTE — Discharge Summary (Signed)
Patient ID: Brianna Lambert MRN: [id number] DOB/AGE: 1956-08-11 55 y.o.  Admit date: 11/02/2011 Discharge date: 11/05/2011  Admission Diagnoses:  Principal Problem:  *Lumbar foraminal stenosis   Discharge Diagnoses:  Same  Past Medical History  Diagnosis Date  . Allergy   . Low back pain radiating to both legs 2012    Dr. Electa Sniff  . GERD (gastroesophageal reflux disease)   . PONV (postoperative nausea and vomiting)   . Asthma     environmental allergies  . Arthritis     knees and spine  . Lumbar pain with radiation down right leg   . Hiatal hernia     Surgeries: Procedure(s): POSTERIOR LUMBAR FUSION 3 LEVEL on 11/02/2011   Consultants:    Discharged Condition: Improved  Hospital Course: Brianna Lambert is an 55 y.o. female who was admitted 11/02/2011 for operative treatment ofLumbar foraminal stenosis. Patient has severe unremitting pain that affects sleep, daily activities, and work/hobbies. After pre-op clearance the patient was taken to the operating room on 11/02/2011 and underwent  Procedure(s): POSTERIOR LUMBAR FUSION 3 LEVEL.    Patient was given perioperative antibiotics: Anti-infectives     Start     Dose/Rate Route Frequency Ordered Stop   11/02/11 2000   ceFAZolin (ANCEF) IVPB 1 g/50 mL premix        1 g 100 mL/hr over 30 Minutes Intravenous 3 times per day 11/02/11 1421 11/03/11 0300           Patient was given sequential compression devices, early ambulation, and chemoprophylaxis to prevent DVT.  Patient benefited maximally from hospital stay and there were no complications.    Recent vital signs:  Patient Vitals for the past 24 hrs:  BP Temp Temp src Pulse Resp SpO2  11/05/11 1400 140/57 mmHg 98.5 F (36.9 C) Oral 74  20  100 %  11/05/11 0526 110/42 mmHg 98.3 F (36.8 C) Oral 61  18  96 %  11/04/11 2124 126/54 mmHg 98.1 F (36.7 C) Oral 68  18  94 %     Recent laboratory studies: No results found for this basename:  WBC:2,HGB:2,HCT:2,PLT:2,NA:2,K:2,CL:2,CO2:2,BUN:2,CREATININE:2,GLUCOSE:2,PT:2,INR:2,CALCIUM,2: in the last 72 hours   Discharge Medications:  Discharge Medication List as of 11/05/2011 12:24 PM    START taking these medications   Details  diazepam (VALIUM) 5 MG tablet Take 1 tablet (5 mg total) by mouth every 6 (six) hours as needed (spasm)., Starting 11/05/2011, Until Wed 11/15/11, Print    oxyCODONE (OXYCONTIN) 10 MG 12 hr tablet Take 1 tablet (10 mg total) by mouth every 12 (twelve) hours., Starting 11/05/2011, Until Sun 11/19/11, Print    oxyCODONE-acetaminophen (PERCOCET) 5-325 MG per tablet Take 1 tablet by mouth every 4 (four) hours as needed for pain., Starting 11/05/2011, Until Wed 11/15/11, Print      CONTINUE these medications which have NOT CHANGED   Details  cetirizine (ZYRTEC) 10 MG tablet Take 10 mg by mouth daily.  , Until Discontinued, Historical Med    levalbuterol (XOPENEX HFA) 45 MCG/ACT inhaler Inhale 1-2 puffs into the lungs every 4 (four) hours as needed. For shortness of breath , Until Discontinued, Historical Med    Omeprazole-Sodium Bicarbonate (ZEGERID OTC PO) Take 1 tablet by mouth daily.  , Until Discontinued, Historical Med      STOP taking these medications     cyclobenzaprine (FLEXERIL) 5 MG tablet      HYDROcodone-acetaminophen (NORCO) 5-325 MG per tablet         Diagnostic Studies: Dg Chest 2  View  10/27/2011  *RADIOLOGY REPORT*  Clinical Data: Preoperative evaluation for lumbar surgery.  History of asthma  CHEST - 2 VIEW  Comparison: 10/05/2004  Findings: Heart and mediastinal contours are within normal limits. The lung fields appear clear with no signs of focal infiltrate or congestive failure.  No pleural fluid or significant peribronchial cuffing is seen.  Bony structures demonstrate mild degenerative osteophytosis of the mid thoracic spine and are otherwise intact.  IMPRESSION: Stable cardiopulmonary appearance with no new worrisome focal or  acute abnormality suggested.  Original Report Authenticated By: Brianna Stakes, M.D.   Dg Lumbar Spine 2-3 Views  11/02/2011  *RADIOLOGY REPORT*  Clinical Data: L3-4 laminectomy and fusion.  LUMBAR SPINE - 2-3 VIEW  Comparison: Intraoperative views of the lumbar spine 11/20/2011.  Findings: We are provided two fluoroscopic intraoperative spot views lumbar spine.  Images demonstrate pedicle screws and stabilization bars with interbody spacer at L3-4.  Vertebral body height appears maintained.  IMPRESSION: L3-4 laminectomy and fusion.  Original Report Authenticated By: Brianna Bell. D'ALESSIO, M.D.   Dg Lumbar Spine 1 View  11/02/2011  *RADIOLOGY REPORT*  Clinical Data: L2 - L3 and L4 - L5 decompression; L3 - L for T L I F  LUMBAR SPINE - 1 VIEW  Comparison: CT abdomen pelvis - 10/16/2004  Findings:  A single intraoperative lateral radiograph of the lumbar spine is provided for review.  Lumbar spine labeling is based on the presumption of five non-rib bearing lumbar type vertebral bodies.  Marking needle tips project posterior to the L2 - L3 intervertebral disc space and posterior to the L4 vertebral body.  IMPRESSION: Intraoperative spinal localization as above.  Original Report Authenticated By: Brianna Lambert, M.D.   Dg C-arm Gt 120 Min  11/02/2011  CLINICAL DATA: perioperative use   C-ARM GT 120 MIN  Fluoroscopy was utilized by the requesting physician.  No radiographic  interpretation.      Disposition: Home or Self Care  Discharge Orders    Future Orders Please Complete By Expires   Diet - low sodium heart healthy      Increase activity slowly      Walker       Call MD for:  temperature >100.4      Call MD for:  severe uncontrolled pain      Call MD for:  redness, tenderness, or signs of infection (pain, swelling, redness, odor or green/yellow discharge around incision site)      Discharge instructions      Comments:   F/U with Dr. Yevette Lambert as scheduled.  Follow discharge instructions  given by office.         SignedNestor Lambert 11/05/2011, 4:05 PM

## 2011-11-05 NOTE — Progress Notes (Signed)
   CARE MANAGEMENT NOTE 11/05/2011  Patient:  Brianna Lambert, Brianna Lambert   Account Number:  [id number]  Date Initiated:  11/05/2011  Documentation initiated by:  Tera Mater  Subjective/Objective Assessment:   DX:  Right leg pain. S/P back surgery     Action/Plan:   discharge planning   Anticipated DC Date:  11/05/2011   Anticipated DC Plan:  HOME/SELF CARE      DC Planning Services  CM consult      Choice offered to / List presented to:     DME arranged  3-N-1  Levan Hurst      DME agency  [facility]        Status of service:  Completed, signed off Medicare Important Message given?  NO (If response is "NO", the following Medicare IM given date fields will be blank) Date Medicare IM given:   Date Additional Medicare IM given:    Discharge Disposition:  HOME/SELF CARE  Per UR Regulation:    Comments:

## 2011-11-06 MED FILL — Sodium Chloride Irrigation Soln 0.9%: Qty: 3000 | Status: AC

## 2011-11-06 MED FILL — Heparin Sodium (Porcine) Inj 1000 Unit/ML: INTRAMUSCULAR | Qty: 30 | Status: AC

## 2011-11-06 MED FILL — Sodium Chloride IV Soln 0.9%: INTRAVENOUS | Qty: 1000 | Status: AC

## 2012-07-11 ENCOUNTER — Other Ambulatory Visit (INDEPENDENT_AMBULATORY_CARE_PROVIDER_SITE_OTHER): Payer: 59

## 2012-07-11 ENCOUNTER — Encounter: Payer: Self-pay | Admitting: Internal Medicine

## 2012-07-11 ENCOUNTER — Telehealth: Payer: Self-pay | Admitting: *Deleted

## 2012-07-11 ENCOUNTER — Ambulatory Visit (INDEPENDENT_AMBULATORY_CARE_PROVIDER_SITE_OTHER): Payer: 59 | Admitting: Internal Medicine

## 2012-07-11 VITALS — BP 144/94 | HR 70 | Temp 98.1°F | Resp 16 | Ht 67.0 in | Wt 289.8 lb

## 2012-07-11 DIAGNOSIS — J45998 Other asthma: Secondary | ICD-10-CM

## 2012-07-11 DIAGNOSIS — J45909 Unspecified asthma, uncomplicated: Secondary | ICD-10-CM | POA: Insufficient documentation

## 2012-07-11 DIAGNOSIS — I10 Essential (primary) hypertension: Secondary | ICD-10-CM

## 2012-07-11 DIAGNOSIS — R079 Chest pain, unspecified: Secondary | ICD-10-CM

## 2012-07-11 DIAGNOSIS — Z23 Encounter for immunization: Secondary | ICD-10-CM

## 2012-07-11 LAB — URINALYSIS, ROUTINE W REFLEX MICROSCOPIC
Specific Gravity, Urine: 1.025 (ref 1.000–1.030)
Total Protein, Urine: NEGATIVE
Urine Glucose: NEGATIVE
pH: 6 (ref 5.0–8.0)

## 2012-07-11 LAB — LIPID PANEL
Cholesterol: 223 mg/dL — ABNORMAL HIGH (ref 0–200)
HDL: 51.2 mg/dL (ref >39.00)
Triglycerides: 104 mg/dL (ref 0.0–149.0)

## 2012-07-11 LAB — COMPREHENSIVE METABOLIC PANEL
AST: 20 U/L (ref 0–37)
Alkaline Phosphatase: 64 U/L (ref 39–117)
BUN: 20 mg/dL (ref 6–23)
Calcium: 8.8 mg/dL (ref 8.4–10.5)
Chloride: 101 mEq/L (ref 96–112)
Creatinine, Ser: 0.8 mg/dL (ref 0.4–1.2)

## 2012-07-11 LAB — CBC WITH DIFFERENTIAL/PLATELET
Eosinophils Absolute: 0.1 10*3/uL (ref 0.0–0.7)
MCHC: 33.1 g/dL (ref 30.0–36.0)
MCV: 86.8 fl (ref 78.0–100.0)
Monocytes Absolute: 0.6 10*3/uL (ref 0.1–1.0)
Neutrophils Relative %: 59.3 % (ref 43.0–77.0)
Platelets: 271 10*3/uL (ref 150.0–400.0)

## 2012-07-11 LAB — CARDIAC PANEL
CK-MB: 1.7 ng/mL (ref 0.3–4.0)
Total CK: 88 U/L (ref 7–177)

## 2012-07-11 LAB — TROPONIN I: Troponin I: <0.01 ng/mL (ref <0.06)

## 2012-07-11 MED ORDER — OLMESARTAN MEDOXOMIL 40 MG PO TABS: 40.0000 mg | ORAL_TABLET | Freq: Every day | ORAL | Status: DC

## 2012-07-11 NOTE — Assessment & Plan Note (Signed)
She will start Benicar, I will check her labs today to look for secondary causes and end organ damage

## 2012-07-11 NOTE — Assessment & Plan Note (Signed)
Her EKG is unchanged from her prior EKG's, I will check labs today to look for ischemia, chf, anemia

## 2012-07-11 NOTE — Assessment & Plan Note (Signed)
Pneumovax today 

## 2012-07-11 NOTE — Telephone Encounter (Signed)
Lab called and stated pt's troponin level is <0.01.

## 2012-07-11 NOTE — Patient Instructions (Signed)
Hypertension As your heart beats, it forces blood through your arteries. This force is your blood pressure. If the pressure is too high, it is called hypertension (HTN) or high blood pressure. HTN is dangerous because you may have it and not know it. High blood pressure may mean that your heart has to work harder to pump blood. Your arteries may be narrow or stiff. The extra work puts you at risk for heart disease, stroke, and other problems.  Blood pressure consists of two numbers, a higher number over a lower, 110/72, for example. It is stated as "110 over 72." The ideal is below 120 for the top number (systolic) and under 80 for the bottom (diastolic). Write down your blood pressure today. You should pay close attention to your blood pressure if you have certain conditions such as:  Heart failure.   Prior heart attack.   Diabetes   Chronic kidney disease.   Prior stroke.   Multiple risk factors for heart disease.  To see if you have HTN, your blood pressure should be measured while you are seated with your arm held at the level of the heart. It should be measured at least twice. A one-time elevated blood pressure reading (especially in the Emergency Department) does not mean that you need treatment. There may be conditions in which the blood pressure is different between your right and left arms. It is important to see your caregiver soon for a recheck. Most people have essential hypertension which means that there is not a specific cause. This type of high blood pressure may be lowered by changing lifestyle factors such as:  Stress.   Smoking.   Lack of exercise.   Excessive weight.   Drug/tobacco/alcohol use.   Eating less salt.  Most people do not have symptoms from high blood pressure until it has caused damage to the body. Effective treatment can often prevent, delay or reduce that damage. TREATMENT  When a cause has been identified, treatment for high blood pressure is  directed at the cause. There are a large number of medications to treat HTN. These fall into several categories, and your caregiver will help you select the medicines that are best for you. Medications may have side effects. You should review side effects with your caregiver. If your blood pressure stays high after you have made lifestyle changes or started on medicines,   Your medication(s) may need to be changed.   Other problems may need to be addressed.   Be certain you understand your prescriptions, and know how and when to take your medicine.   Be sure to follow up with your caregiver within the time frame advised (usually within two weeks) to have your blood pressure rechecked and to review your medications.   If you are taking more than one medicine to lower your blood pressure, make sure you know how and at what times they should be taken. Taking two medicines at the same time can result in blood pressure that is too low.  SEEK IMMEDIATE MEDICAL CARE IF:  You develop a severe headache, blurred or changing vision, or confusion.   You have unusual weakness or numbness, or a faint feeling.   You have severe chest or abdominal pain, vomiting, or breathing problems.  MAKE SURE YOU:   Understand these instructions.   Will watch your condition.   Will get help right away if you are not doing well or get worse.  Document Released: 11/06/2005 Document Revised: 10/26/2011 Document Reviewed:   06/26/2008 [employer] Patient Information 2012 [place], [place].Chest Pain (Nonspecific) It is often hard to give a specific diagnosis for the cause of chest pain. There is always a chance that your pain could be related to something serious, such as a heart attack or a blood clot in the lungs. You need to follow up with your caregiver for further evaluation. CAUSES   Heartburn.   Pneumonia or bronchitis.   Anxiety or stress.   Inflammation around your heart (pericarditis) or lung (pleuritis or  pleurisy).   A blood clot in the lung.   A collapsed lung (pneumothorax). It can develop suddenly on its own (spontaneous pneumothorax) or from injury (trauma) to the chest.   Shingles infection (herpes zoster virus).  The chest wall is composed of bones, muscles, and cartilage. Any of these can be the source of the pain.  The bones can be bruised by injury.   The muscles or cartilage can be strained by coughing or overwork.   The cartilage can be affected by inflammation and become sore (costochondritis).  DIAGNOSIS  Lab tests or other studies, such as X-rays, electrocardiography, stress testing, or cardiac imaging, may be needed to find the cause of your pain.  TREATMENT   Treatment depends on what may be causing your chest pain. Treatment may include:   Acid blockers for heartburn.   Anti-inflammatory medicine.   Pain medicine for inflammatory conditions.   Antibiotics if an infection is present.   You may be advised to change lifestyle habits. This includes stopping smoking and avoiding alcohol, caffeine, and chocolate.   You may be advised to keep your head raised (elevated) when sleeping. This reduces the chance of acid going backward from your stomach into your esophagus.   Most of the time, nonspecific chest pain will improve within 2 to 3 days with rest and mild pain medicine.  HOME CARE INSTRUCTIONS   If antibiotics were prescribed, take your antibiotics as directed. Finish them even if you start to feel better.   For the next few days, avoid physical activities that bring on chest pain. Continue physical activities as directed.   Do not smoke.   Avoid drinking alcohol.   Only take over-the-counter or prescription medicine for pain, discomfort, or fever as directed by your caregiver.   Follow your caregiver's suggestions for further testing if your chest pain does not go away.   Keep any follow-up appointments you made. If you do not go to an appointment, you  could develop lasting (chronic) problems with pain. If there is any problem keeping an appointment, you must call to reschedule.  SEEK MEDICAL CARE IF:   You think you are having problems from the medicine you are taking. Read your medicine instructions carefully.   Your chest pain does not go away, even after treatment.   You develop a rash with blisters on your chest.  SEEK IMMEDIATE MEDICAL CARE IF:   You have increased chest pain or pain that spreads to your arm, neck, jaw, back, or abdomen.   You develop shortness of breath, an increasing cough, or you are coughing up blood.   You have severe back or abdominal pain, feel nauseous, or vomit.   You develop severe weakness, fainting, or chills.   You have a fever.  THIS IS AN EMERGENCY. Do not wait to see if the pain will go away. Get medical help at once. Call your local emergency services (911 in [place] Do not drive yourself to the hospital. MAKE SURE  YOU:   Understand these instructions.   Will watch your condition.   Will get help right away if you are not doing well or get worse.  Document Released: 08/16/2005 Document Revised: 10/26/2011 Document Reviewed: 06/11/2008 [facility] Patient Information 2012 [place], [place].

## 2012-07-11 NOTE — Progress Notes (Signed)
  Subjective:    Patient ID: Brianna Lambert, female    DOB: 1956/07/23, 56 y.o.   MRN: [id number]  Hypertension This is a new problem. The current episode started today. The problem has been gradually worsening since onset. The problem is uncontrolled. Associated symptoms include chest pain (tightness across her upper chest) and malaise/fatigue. Pertinent negatives include no headaches, neck pain, orthopnea, palpitations, peripheral edema, PND, shortness of breath or sweats. Risk factors for coronary artery disease include obesity and post-menopausal state. Past treatments include nothing. Compliance problems include exercise and diet.       Review of Systems  Constitutional: Positive for malaise/fatigue and unexpected weight change (weight gain). Negative for fever, chills, diaphoresis, activity change, appetite change and fatigue.  HENT: Negative.  Negative for neck pain.   Eyes: Negative.   Respiratory: Positive for chest tightness. Negative for apnea, cough, choking, shortness of breath, wheezing and stridor.   Cardiovascular: Positive for chest pain (tightness across her upper chest). Negative for palpitations, orthopnea, leg swelling and PND.  Gastrointestinal: Negative.   Genitourinary: Negative.   Musculoskeletal: Negative.   Skin: Negative.   Neurological: Negative for dizziness, tremors, seizures, syncope, facial asymmetry, speech difficulty, weakness, light-headedness, numbness and headaches.  Hematological: Negative for adenopathy. Does not bruise/bleed easily.  Psychiatric/Behavioral: Positive for disturbed wake/sleep cycle (FA's, EMA's). Negative for suicidal ideas, hallucinations, behavioral problems, confusion, self-injury, dysphoric mood, decreased concentration and agitation. The patient is not nervous/anxious and is not hyperactive.        Objective:   Physical Exam  Vitals reviewed. Constitutional: She is oriented to person, place, and time. She appears well-developed  and well-nourished.  Non-toxic appearance. She does not have a sickly appearance. She does not appear ill. No distress.  HENT:  Head: Normocephalic and atraumatic.  Mouth/Throat: Oropharynx is clear and moist. No oropharyngeal exudate.  Eyes: Conjunctivae are normal. Right eye exhibits no discharge. Left eye exhibits no discharge. No scleral icterus.  Neck: Normal range of motion. Neck supple. No JVD present. No tracheal deviation present. No thyromegaly present.  Cardiovascular: Normal rate, regular rhythm, normal heart sounds and intact distal pulses.  Exam reveals no gallop and no friction rub.   No murmur heard. Pulmonary/Chest: Effort normal and breath sounds normal. No stridor. No respiratory distress. She has no wheezes. She has no rales. She exhibits no tenderness.  Abdominal: Soft. Bowel sounds are normal. She exhibits no distension and no mass. There is no tenderness. There is no rebound and no guarding.  Musculoskeletal: Normal range of motion. She exhibits no edema and no tenderness.  Lymphadenopathy:    She has no cervical adenopathy.  Neurological: She is oriented to person, place, and time.  Skin: Skin is warm and dry. No rash noted. She is not diaphoretic. No erythema. No pallor.  Psychiatric: She has a normal mood and affect. Her behavior is normal. Judgment and thought content normal.      Lab Results  Component Value Date   WBC 10.5 10/27/2011   HGB 15.0 10/27/2011   HCT 43.2 10/27/2011   PLT 360 10/27/2011   GLUCOSE 91 10/27/2011   ALT 17 10/27/2011   AST 18 10/27/2011   NA 138 10/27/2011   K 3.8 10/27/2011   CL 99 10/27/2011   CREATININE 0.71 10/27/2011   BUN 16 10/27/2011   CO2 28 10/27/2011   TSH 0.78 09/18/2011   INR 0.97 10/27/2011     Assessment & Plan:

## 2012-08-01 ENCOUNTER — Encounter: Payer: Self-pay | Admitting: Internal Medicine

## 2012-08-01 ENCOUNTER — Other Ambulatory Visit (INDEPENDENT_AMBULATORY_CARE_PROVIDER_SITE_OTHER): Payer: 59

## 2012-08-01 ENCOUNTER — Ambulatory Visit (INDEPENDENT_AMBULATORY_CARE_PROVIDER_SITE_OTHER): Payer: 59 | Admitting: Internal Medicine

## 2012-08-01 VITALS — BP 132/84 | HR 63 | Temp 97.2°F | Resp 16 | Wt 291.5 lb

## 2012-08-01 DIAGNOSIS — I10 Essential (primary) hypertension: Secondary | ICD-10-CM

## 2012-08-01 DIAGNOSIS — R7309 Other abnormal glucose: Secondary | ICD-10-CM

## 2012-08-01 DIAGNOSIS — E118 Type 2 diabetes mellitus with unspecified complications: Secondary | ICD-10-CM | POA: Insufficient documentation

## 2012-08-01 DIAGNOSIS — E785 Hyperlipidemia, unspecified: Secondary | ICD-10-CM

## 2012-08-01 LAB — BASIC METABOLIC PANEL
BUN: 14 mg/dL (ref 6–23)
Chloride: 103 mEq/L (ref 96–112)
Creatinine, Ser: 0.8 mg/dL (ref 0.4–1.2)
GFR: 75.29 mL/min (ref >60.00)
Glucose, Bld: 135 mg/dL — ABNORMAL HIGH (ref 70–99)
Potassium: 4.4 mEq/L (ref 3.5–5.1)

## 2012-08-01 MED ORDER — ROSUVASTATIN CALCIUM 5 MG PO TABS: 5.0000 mg | ORAL_TABLET | Freq: Every day | ORAL | Status: DC

## 2012-08-01 MED ORDER — OLMESARTAN MEDOXOMIL 40 MG PO TABS: 40.0000 mg | ORAL_TABLET | Freq: Every day | ORAL | Status: DC

## 2012-08-01 NOTE — Assessment & Plan Note (Signed)
Her BP is well controlled, I will recheck her lytes today 

## 2012-08-01 NOTE — Assessment & Plan Note (Signed)
I will check her a1c to see if she has developed DM II 

## 2012-08-01 NOTE — Assessment & Plan Note (Signed)
She has several risk factors for CAD and CVA so I have asked her to start crestor to lower her LDL and reduce her risks

## 2012-08-01 NOTE — Patient Instructions (Signed)

## 2012-08-01 NOTE — Progress Notes (Signed)
  Subjective:    Patient ID: Brianna Lambert, female    DOB: 1956/05/07, 56 y.o.   MRN: [id number]  Hypertension This is a chronic problem. The current episode started more than 1 month ago. The problem has been gradually improving since onset. The problem is controlled. Pertinent negatives include no anxiety, blurred vision, chest pain, headaches, malaise/fatigue, neck pain, orthopnea, palpitations, peripheral edema, PND, shortness of breath or sweats. There are no associated agents to hypertension. Past treatments include angiotensin blockers. The current treatment provides moderate improvement. Compliance problems include exercise and diet.       Review of Systems  Constitutional: Negative for fever, chills, malaise/fatigue, diaphoresis, activity change, appetite change, fatigue and unexpected weight change.  HENT: Negative.  Negative for neck pain.   Eyes: Negative.  Negative for blurred vision.  Respiratory: Negative for cough, chest tightness, shortness of breath, wheezing and stridor.   Cardiovascular: Negative for chest pain, palpitations, orthopnea, leg swelling and PND.  Gastrointestinal: Negative for nausea, vomiting, abdominal pain, diarrhea, constipation and blood in stool.  Genitourinary: Negative.   Musculoskeletal: Negative.   Skin: Negative.   Neurological: Negative for dizziness, tremors, syncope, facial asymmetry, speech difficulty, weakness and headaches.  Hematological: Negative for adenopathy. Does not bruise/bleed easily.  Psychiatric/Behavioral: Negative.        Objective:   Physical Exam  Vitals reviewed. Constitutional: She is oriented to person, place, and time. She appears well-developed and well-nourished. No distress.  HENT:  Head: Normocephalic and atraumatic.  Mouth/Throat: Oropharynx is clear and moist. No oropharyngeal exudate.  Eyes: Conjunctivae normal are normal. Right eye exhibits no discharge. Left eye exhibits no discharge. No scleral icterus.    Neck: Normal range of motion. Neck supple. No JVD present. No tracheal deviation present. No thyromegaly present.  Cardiovascular: Normal rate, regular rhythm, normal heart sounds and intact distal pulses.  Exam reveals no gallop and no friction rub.   No murmur heard. Pulmonary/Chest: Effort normal and breath sounds normal. No stridor. No respiratory distress. She has no wheezes. She has no rales. She exhibits no tenderness.  Abdominal: Soft. Bowel sounds are normal. She exhibits no distension and no mass. There is no tenderness. There is no rebound and no guarding.  Musculoskeletal: Normal range of motion. She exhibits no edema and no tenderness.  Lymphadenopathy:    She has no cervical adenopathy.  Neurological: She is oriented to person, place, and time.  Skin: Skin is warm and dry. No rash noted. She is not diaphoretic. No erythema. No pallor.  Psychiatric: She has a normal mood and affect. Her behavior is normal. Judgment and thought content normal.     Lab Results  Component Value Date   WBC 7.4 07/11/2012   HGB 14.0 07/11/2012   HCT 42.3 07/11/2012   PLT 271.0 07/11/2012   GLUCOSE 147* 07/11/2012   CHOL 223* 07/11/2012   TRIG 104.0 07/11/2012   HDL 51.20 07/11/2012   LDLDIRECT 155.6 07/11/2012   ALT 23 07/11/2012   AST 20 07/11/2012   NA 136 07/11/2012   K 4.2 07/11/2012   CL 101 07/11/2012   CREATININE 0.8 07/11/2012   BUN 20 07/11/2012   CO2 27 07/11/2012   TSH 0.85 07/11/2012   INR 0.97 10/27/2011       Assessment & Plan:

## 2012-08-02 ENCOUNTER — Other Ambulatory Visit: Payer: Self-pay | Admitting: Internal Medicine

## 2012-08-02 ENCOUNTER — Encounter: Payer: Self-pay | Admitting: Internal Medicine

## 2012-08-02 DIAGNOSIS — IMO0001 Reserved for inherently not codable concepts without codable children: Secondary | ICD-10-CM

## 2012-08-02 MED ORDER — METFORMIN HCL ER (MOD) 1000 MG PO TB24: 1000.0000 mg | ORAL_TABLET | Freq: Every day | ORAL | Status: DC

## 2012-09-03 ENCOUNTER — Telehealth: Payer: Self-pay | Admitting: Internal Medicine

## 2012-09-03 MED ORDER — CEFUROXIME AXETIL 500 MG PO TABS: 500.0000 mg | ORAL_TABLET | Freq: Two times a day (BID) | ORAL | Status: DC

## 2012-09-03 NOTE — Telephone Encounter (Signed)
Caller: [name]/Patient; Patient Name: Brianna Lambert; PCP: Sanda Linger (Adults only); Best Callback Phone Number: [phone] Patient is calling about she feels as if she is getting a sinus infection, has headache and stopped up ear on right side.   Afebrile.  Onset 08/30/12 but symptoms are worsening.  All emergent signs/sx r/o with exception to productive cough with color sputum per Upper Respiratory Infection protocol.  Offered patient appointment for 09/04/12 @ 1115 per see provider within 24 hours disposition.   Patient had already spoke with someone earlier that told her there were no appointments available 09/03/12.   Patient was hoping that something for sinus infection would just be called in for her.  Refused appointment.  Reports if nothing is called in she will either call allergist or call office 09/04/12 for appointment.

## 2012-09-03 NOTE — Telephone Encounter (Signed)
Left message that prescription was left at pharmacy.

## 2012-09-03 NOTE — Telephone Encounter (Signed)
done

## 2012-09-11 ENCOUNTER — Encounter: Payer: Self-pay | Admitting: Internal Medicine

## 2012-09-11 ENCOUNTER — Encounter: Payer: Self-pay | Admitting: Endocrinology

## 2012-09-11 ENCOUNTER — Ambulatory Visit (INDEPENDENT_AMBULATORY_CARE_PROVIDER_SITE_OTHER): Payer: 59 | Admitting: Endocrinology

## 2012-09-11 VITALS — BP 122/78 | HR 66 | Temp 98.0°F | Resp 16 | Ht 67.0 in | Wt 283.1 lb

## 2012-09-11 DIAGNOSIS — IMO0001 Reserved for inherently not codable concepts without codable children: Secondary | ICD-10-CM

## 2012-09-11 LAB — HM MAMMOGRAPHY: HM Mammogram: NORMAL

## 2012-09-11 NOTE — Patient Instructions (Addendum)
good diet and exercise habits significanly improve the control of your diabetes.  please let me know if you wish to be referred to a dietician, or for weight-loss surgery.  high blood sugar is very risky to your health.  you should see an eye doctor every year.  You are at higher than average risk for pneumonia and hepatitis-B.  You should be vaccinated against both.   controlling your blood pressure and cholesterol drastically reduces the damage diabetes does to your body.  this also applies to quitting smoking.  please discuss these with your doctor.  you should take an aspirin every day, unless you have been advised by a doctor not to. check your blood sugar once a day.  vary the time of day when you check, between before the 3 meals, and at bedtime.  also check if you have symptoms of your blood sugar being too high or too low.  please keep a record of the readings and bring it to your next appointment here.  please call us sooner if your blood sugar goes below 70, or if you have a lot of readings over 200. Please continue the same metformin. Please come back for a follow-up appointment in 2-3 months.

## 2012-09-11 NOTE — Progress Notes (Signed)
Subjective:    Patient ID: Brianna Lambert, female    DOB: Jun 30, 1956, 56 y.o.   MRN: [id number]  HPI pt was dx'ed with dm a few mos ago.  she is unaware of any chronic complications.  she has never been on insulin.  pt says her and exercise are much better, since her recovery from back surgery.  She was started on metformin 1 month ago.  Pt states 1 year of moderate pain at the lower back, but no assoc numbness.  sxs are improved now. Past Medical History  Diagnosis Date  . Allergy   . Low back pain radiating to both legs 2012    Dr. Electa Sniff  . GERD (gastroesophageal reflux disease)   . PONV (postoperative nausea and vomiting)   . Asthma     environmental allergies  . Arthritis     knees and spine  . Lumbar pain with radiation down right leg   . Hiatal hernia     Past Surgical History  Procedure Date  . Cesarean section 1986; 1989  . Cholecystectomy 2003  . Back surgery 1992    lumbar lam  . Knee arthroscopy 2000    rt knee repair  . Posterior fusion lumbar spine 11/02/11    right PLIF; L2-L5  . Tonsillectomy 1970's  . Tubal ligation 1989    History   Social History  . Marital Status: Widowed    Spouse Name: N/A    Number of Children: N/A  . Years of Education: N/A   Occupational History  . Not on file.   Social History Main Topics  . Smoking status: Never Smoker   . Smokeless tobacco: Never Used  . Alcohol Use: No  . Drug Use: No  . Sexually Active: No   Other Topics Concern  . Not on file   Social History Narrative  . No narrative on file    Current Outpatient Prescriptions on File Prior to Visit  Medication Sig Dispense Refill  . cefUROXime (CEFTIN) 500 MG tablet Take 1 tablet (500 mg total) by mouth 2 (two) times daily.  20 tablet  0  . cetirizine (ZYRTEC) 10 MG tablet Take 10 mg by mouth daily.        Marland Kitchen levalbuterol (XOPENEX HFA) 45 MCG/ACT inhaler Inhale 1-2 puffs into the lungs every 4 (four) hours as needed. For shortness of breath       .  metFORMIN (GLUMETZA) 1000 MG (MOD) 24 hr tablet Take 1 tablet (1,000 mg total) by mouth daily with breakfast.  90 tablet  3  . montelukast (SINGULAIR) 10 MG tablet Take 10 mg by mouth at bedtime.      Marland Kitchen olmesartan (BENICAR) 40 MG tablet Take 1 tablet (40 mg total) by mouth daily.  90 tablet  3  . omeprazole (PRILOSEC) 20 MG capsule       . Omeprazole-Sodium Bicarbonate (ZEGERID OTC PO) Take 1 tablet by mouth daily.        . rosuvastatin (CRESTOR) 5 MG tablet Take 1 tablet (5 mg total) by mouth daily.  90 tablet  3    No Known Allergies  Family History  Problem Relation Age of Onset  . Heart disease Father   . Diabetes Father   . Cancer Neg Hx   . Anesthesia problems Neg Hx   . Hypotension Neg Hx   . Malignant hyperthermia Neg Hx   . Pseudochol deficiency Neg Hx     BP 122/78  Pulse 66  Temp 98  F (36.7 C) (Oral)  Resp 16  Ht 5\' 7"  (1.702 m)  Wt 283 lb 2 oz (128.425 kg)  BMI 44.34 kg/m2  SpO2 97%  Review of Systems denies blurry vision, headache, chest pain, n/v, urinary frequency, cramps, excessive diaphoresis, memory loss, depression, hypoglycemia, rhinorrhea, and easy bruising.  She has lost a few lbs, due to her efforts.  She attributes doe to asthma.      Objective:   Physical Exam VS: see vs page GEN: no distress.  Obese. HEAD: head: no deformity eyes: no periorbital swelling, no proptosis external nose and ears are normal mouth: no lesion seen NECK: supple, thyroid is not enlarged CHEST WALL: no deformity LUNGS:  Clear to auscultation CV: reg rate and rhythm, no murmur ABD: abdomen is soft, nontender.  no hepatosplenomegaly.  not distended.  no hernia MUSCULOSKELETAL: muscle bulk and strength are grossly normal.  no obvious joint swelling.  gait is normal and steady EXTEMITIES: no deformity.  no ulcer on the feet.  feet are of normal color and temp.  no edema PULSES: dorsalis pedis intact bilat.  no carotid bruit NEURO:  cn 2-12 grossly intact.   readily moves  all 4's.  sensation is intact to touch on the feet SKIN:  Normal texture and temperature.  No rash or suspicious lesion is visible.   NODES:  None palpable at the neck PSYCH: alert, oriented x3.  Does not appear anxious nor depressed.  Lab Results  Component Value Date   HGBA1C 7.8* 08/01/2012      Assessment & Plan:  DM; she needs a1c well below 7, if it can be done with a regimen that avoids or minimizes hypoglycemia. Low-back pain.  This limits exercise rx of dm, but sxs are better now Asthma.  This also limits exercise of DM

## 2012-11-04 ENCOUNTER — Other Ambulatory Visit: Payer: Self-pay | Admitting: Endocrinology

## 2012-11-04 ENCOUNTER — Telehealth: Payer: Self-pay | Admitting: *Deleted

## 2012-11-04 DIAGNOSIS — IMO0001 Reserved for inherently not codable concepts without codable children: Secondary | ICD-10-CM

## 2012-11-04 LAB — HEMOGLOBIN A1C
Hgb A1c MFr Bld: 6.6 % — ABNORMAL HIGH (ref <5.7)
Mean Plasma Glucose: 143 mg/dL — ABNORMAL HIGH (ref <117)

## 2012-11-04 NOTE — Telephone Encounter (Signed)
PATIENT CALLED AND IS ON HER WAY TO [facility] NOW THIS MORNING TO GET HER LAB WORK DONE. NO ORDERS IN SYSTEM. PATIENT STATES APPT IS  11/06/2012 WITH DR. Everardo All.

## 2012-11-04 NOTE — Telephone Encounter (Signed)
i ordered

## 2012-11-06 ENCOUNTER — Ambulatory Visit (INDEPENDENT_AMBULATORY_CARE_PROVIDER_SITE_OTHER): Payer: 59 | Admitting: Endocrinology

## 2012-11-06 ENCOUNTER — Encounter: Payer: Self-pay | Admitting: Endocrinology

## 2012-11-06 VITALS — BP 124/76 | HR 80 | Temp 98.8°F | Wt 278.0 lb

## 2012-11-06 DIAGNOSIS — IMO0001 Reserved for inherently not codable concepts without codable children: Secondary | ICD-10-CM

## 2012-11-06 MED ORDER — DOXYCYCLINE HYCLATE 100 MG PO TABS: 100.0000 mg | ORAL_TABLET | Freq: Two times a day (BID) | ORAL | Status: DC

## 2012-11-06 MED ORDER — METFORMIN HCL ER (MOD) 1000 MG PO TB24: 1000.0000 mg | ORAL_TABLET | Freq: Two times a day (BID) | ORAL | Status: DC

## 2012-11-06 NOTE — Progress Notes (Signed)
Subjective:    Patient ID: Brianna Lambert, female    DOB: 23-Feb-1956, 56 y.o.   MRN: [id number]  HPI Pt returns for f/u of type 2 DM (dx'ed 2013).  Pt states few days of moderate pain at the left ear, and assoc nasal congestion. Past Medical History  Diagnosis Date  . Allergy   . Low back pain radiating to both legs 2012    Dr. Electa Sniff  . GERD (gastroesophageal reflux disease)   . PONV (postoperative nausea and vomiting)   . Asthma     environmental allergies  . Arthritis     knees and spine  . Lumbar pain with radiation down right leg   . Hiatal hernia     Past Surgical History  Procedure Date  . Cesarean section 1986; 1989  . Cholecystectomy 2003  . Back surgery 1992    lumbar lam  . Knee arthroscopy 2000    rt knee repair  . Posterior fusion lumbar spine 11/02/11    right PLIF; L2-L5  . Tonsillectomy 1970's  . Tubal ligation 1989    History   Social History  . Marital Status: Widowed    Spouse Name: N/A    Number of Children: N/A  . Years of Education: N/A   Occupational History  . Not on file.   Social History Main Topics  . Smoking status: Never Smoker   . Smokeless tobacco: Never Used  . Alcohol Use: No  . Drug Use: No  . Sexually Active: No   Other Topics Concern  . Not on file   Social History Narrative  . No narrative on file    Current Outpatient Prescriptions on File Prior to Visit  Medication Sig Dispense Refill  . cetirizine (ZYRTEC) 10 MG tablet Take 10 mg by mouth daily.        Marland Kitchen levalbuterol (XOPENEX HFA) 45 MCG/ACT inhaler Inhale 1-2 puffs into the lungs every 4 (four) hours as needed. For shortness of breath       . metFORMIN (GLUMETZA) 1000 MG (MOD) 24 hr tablet Take 1 tablet (1,000 mg total) by mouth 2 (two) times daily with a meal.  180 tablet  3  . montelukast (SINGULAIR) 10 MG tablet Take 10 mg by mouth at bedtime.      Marland Kitchen olmesartan (BENICAR) 40 MG tablet Take 1 tablet (40 mg total) by mouth daily.  90 tablet  3  .  omeprazole (PRILOSEC) 20 MG capsule       . Omeprazole-Sodium Bicarbonate (ZEGERID OTC PO) Take 1 tablet by mouth daily.        . rosuvastatin (CRESTOR) 5 MG tablet Take 1 tablet (5 mg total) by mouth daily.  90 tablet  3    No Known Allergies  Family History  Problem Relation Age of Onset  . Heart disease Father   . Diabetes Father   . Cancer Neg Hx   . Anesthesia problems Neg Hx   . Hypotension Neg Hx   . Malignant hyperthermia Neg Hx   . Pseudochol deficiency Neg Hx     BP 124/76  Pulse 80  Temp 98.8 F (37.1 C) (Oral)  Wt 278 lb (126.1 kg)  SpO2 97%    Review of Systems She has a sore throat, but no fever.    Objective:   Physical Exam VITAL SIGNS:  See vs page GENERAL: no distress head: no deformity eyes: no periorbital swelling, no proptosis external nose and ears are normal mouth: no lesion seen  Left tm is very red.  The right is normal   Lab Results  Component Value Date   HGBA1C 6.6* 11/04/2012      Assessment & Plan:  URI, new. DM, Needs increased rx, if it can be done with a regimen that avoids or minimizes hypoglycemia.

## 2012-11-06 NOTE — Patient Instructions (Addendum)
Please come back for a follow-up appointment in 6 months Please increase the metformin to twice a day. i have sent a prescription to your pharmacy, for an antibiotic pill Loratadine-d (non-prescription) will help your congestion. I hope you feel better soon.  If you don't feel better by next week, please call dr Yetta Barre.

## 2013-03-03 ENCOUNTER — Telehealth: Payer: Self-pay | Admitting: Endocrinology

## 2013-03-03 NOTE — Telephone Encounter (Signed)
recv'd paperwork from [employer] stating pt may benefit from ACE or ARB meds if clinically appropriate. As requested by Lamar Laundry, I have left a message for the pt to call for a follow up w/ Dr. Everardo All, currently awaiting return call from pt / Sherri S.

## 2013-04-21 ENCOUNTER — Telehealth: Payer: Self-pay | Admitting: Endocrinology

## 2013-06-25 ENCOUNTER — Other Ambulatory Visit: Payer: Self-pay

## 2013-06-25 DIAGNOSIS — IMO0001 Reserved for inherently not codable concepts without codable children: Secondary | ICD-10-CM

## 2013-07-04 ENCOUNTER — Other Ambulatory Visit (INDEPENDENT_AMBULATORY_CARE_PROVIDER_SITE_OTHER): Payer: 59

## 2013-07-04 DIAGNOSIS — IMO0001 Reserved for inherently not codable concepts without codable children: Secondary | ICD-10-CM

## 2013-07-04 LAB — HEMOGLOBIN A1C: Hgb A1c MFr Bld: 7 % — ABNORMAL HIGH (ref 4.6–6.5)

## 2013-07-15 ENCOUNTER — Ambulatory Visit (INDEPENDENT_AMBULATORY_CARE_PROVIDER_SITE_OTHER): Payer: 59 | Admitting: Endocrinology

## 2013-07-15 ENCOUNTER — Encounter: Payer: Self-pay | Admitting: Endocrinology

## 2013-07-15 VITALS — BP 132/78 | HR 78 | Ht 65.0 in | Wt 279.0 lb

## 2013-07-15 DIAGNOSIS — IMO0001 Reserved for inherently not codable concepts without codable children: Secondary | ICD-10-CM

## 2013-07-15 MED ORDER — METFORMIN HCL ER (OSM) 500 MG PO TB24: 1000.0000 mg | ORAL_TABLET | Freq: Two times a day (BID) | ORAL | Status: DC

## 2013-07-15 MED ORDER — BROMOCRIPTINE MESYLATE 2.5 MG PO TABS: 2.5000 mg | ORAL_TABLET | Freq: Every day | ORAL | Status: DC

## 2013-07-15 NOTE — Patient Instructions (Addendum)
Let's change the metformin to a different formulation.  i have sent a prescription to your pharmacy.   please add "bromocriptine," to help your blood sugar. It has possible side effects of nausea and dizziness.  These go away with time.  You can avoid these by taking it at bedtime, and by taking just take 1/2 pill for the first week.   Please come back for a follow-up appointment in 3 months.   check your blood sugar once a day.  vary the time of day when you check, between before the 3 meals, and at bedtime.  also check if you have symptoms of your blood sugar being too high or too low.  please keep a record of the readings and bring it to your next appointment here.  please call us sooner if your blood sugar goes below 70, or if you have a lot of readings over 200.

## 2013-07-15 NOTE — Progress Notes (Signed)
Subjective:    Patient ID: Brianna Lambert, female    DOB: 15-Sep-1956, 57 y.o.   MRN: [id number]  HPI Pt returns for f/u of type 2 DM (dx'ed 2013; she has mild if any neuropathy of the lower extremities; no known associated complications).  She has moderate dyspeptic sxs in the epigastric area, in the context of taking metformin. Past Medical History  Diagnosis Date  . Allergy   . Low back pain radiating to both legs 2012    Dr. Electa Sniff  . GERD (gastroesophageal reflux disease)   . PONV (postoperative nausea and vomiting)   . Asthma     environmental allergies  . Arthritis     knees and spine  . Lumbar pain with radiation down right leg   . Hiatal hernia     Past Surgical History  Procedure Laterality Date  . Cesarean section  1986; 1989  . Cholecystectomy  2003  . Back surgery  1992    lumbar lam  . Knee arthroscopy  2000    rt knee repair  . Posterior fusion lumbar spine  11/02/11    right PLIF; L2-L5  . Tonsillectomy  1970's  . Tubal ligation  1989    History   Social History  . Marital Status: Widowed    Spouse Name: N/A    Number of Children: N/A  . Years of Education: N/A   Occupational History  . Not on file.   Social History Main Topics  . Smoking status: Never Smoker   . Smokeless tobacco: Never Used  . Alcohol Use: No  . Drug Use: No  . Sexual Activity: No   Other Topics Concern  . Not on file   Social History Narrative  . No narrative on file    Current Outpatient Prescriptions on File Prior to Visit  Medication Sig Dispense Refill  . cetirizine (ZYRTEC) 10 MG tablet Take 10 mg by mouth daily.        Marland Kitchen doxycycline (VIBRA-TABS) 100 MG tablet Take 1 tablet (100 mg total) by mouth 2 (two) times daily.  14 tablet  0  . levalbuterol (XOPENEX HFA) 45 MCG/ACT inhaler Inhale 1-2 puffs into the lungs every 4 (four) hours as needed. For shortness of breath       . montelukast (SINGULAIR) 10 MG tablet Take 10 mg by mouth at bedtime.      Marland Kitchen  olmesartan (BENICAR) 40 MG tablet Take 1 tablet (40 mg total) by mouth daily.  90 tablet  3  . omeprazole (PRILOSEC) 20 MG capsule       . Omeprazole-Sodium Bicarbonate (ZEGERID OTC PO) Take 1 tablet by mouth daily.        . rosuvastatin (CRESTOR) 5 MG tablet Take 1 tablet (5 mg total) by mouth daily.  90 tablet  3   No current facility-administered medications on file prior to visit.    No Known Allergies  Family History  Problem Relation Age of Onset  . Heart disease Father   . Diabetes Father   . Cancer Neg Hx   . Anesthesia problems Neg Hx   . Hypotension Neg Hx   . Malignant hyperthermia Neg Hx   . Pseudochol deficiency Neg Hx     BP 132/78  Pulse 78  Ht 5\' 5"  (1.651 m)  Wt 279 lb (126.554 kg)  BMI 46.43 kg/m2  SpO2 98%   Review of Systems Denies weight change and numbness.     Objective:   Physical Exam VITAL  SIGNS:  See vs page GENERAL: no distress  Lab Results  Component Value Date   HGBA1C 7.0* 07/04/2013      Assessment & Plan:  DM: Needs increased rx, if it can be done with a regimen that avoids or minimizes hypoglycemia. Dyspeptic sxs, new, due to metformin.

## 2013-09-26 ENCOUNTER — Ambulatory Visit (INDEPENDENT_AMBULATORY_CARE_PROVIDER_SITE_OTHER): Payer: 59 | Admitting: Internal Medicine

## 2013-09-26 ENCOUNTER — Encounter: Payer: Self-pay | Admitting: Internal Medicine

## 2013-09-26 VITALS — BP 122/74 | HR 58 | Temp 98.0°F | Resp 16 | Ht 65.0 in | Wt 282.0 lb

## 2013-09-26 DIAGNOSIS — Z0000 Encounter for general adult medical examination without abnormal findings: Secondary | ICD-10-CM | POA: Insufficient documentation

## 2013-09-26 DIAGNOSIS — IMO0001 Reserved for inherently not codable concepts without codable children: Secondary | ICD-10-CM

## 2013-09-26 DIAGNOSIS — I10 Essential (primary) hypertension: Secondary | ICD-10-CM

## 2013-09-26 MED ORDER — OLMESARTAN MEDOXOMIL 40 MG PO TABS: 40.0000 mg | ORAL_TABLET | Freq: Every day | ORAL | Status: DC

## 2013-09-26 NOTE — Patient Instructions (Signed)
Preventive Care for Adults, Female A healthy lifestyle and preventive care can promote health and wellness. Preventive health guidelines for women include the following key practices.  A routine yearly physical is a good way to check with your caregiver about your health and preventive screening. It is a chance to share any concerns and updates on your health, and to receive a thorough exam.  Visit your dentist for a routine exam and preventive care every 6 months. Brush your teeth twice a day and floss once a day. Good oral hygiene prevents tooth decay and gum disease.  The frequency of eye exams is based on your age, health, family medical history, use of contact lenses, and other factors. Follow your caregiver's recommendations for frequency of eye exams.  Eat a healthy diet. Foods like vegetables, fruits, whole grains, low-fat dairy products, and lean protein foods contain the nutrients you need without too many calories. Decrease your intake of foods high in solid fats, added sugars, and salt. Eat the right amount of calories for you.Get information about a proper diet from your caregiver, if necessary.  Regular physical exercise is one of the most important things you can do for your health. Most adults should get at least 150 minutes of moderate-intensity exercise (any activity that increases your heart rate and causes you to sweat) each week. In addition, most adults need muscle-strengthening exercises on 2 or more days a week.  Maintain a healthy weight. The body mass index (BMI) is a screening tool to identify possible weight problems. It provides an estimate of body fat based on height and weight. Your caregiver can help determine your BMI, and can help you achieve or maintain a healthy weight.For adults 20 years and older:  A BMI below 18.5 is considered underweight.  A BMI of 18.5 to 24.9 is normal.  A BMI of 25 to 29.9 is considered overweight.  A BMI of 30 and above is  considered obese.  Maintain normal blood lipids and cholesterol levels by exercising and minimizing your intake of saturated fat. Eat a balanced diet with plenty of fruit and vegetables. Blood tests for lipids and cholesterol should begin at age 20 and be repeated every 5 years. If your lipid or cholesterol levels are high, you are over 50, or you are at high risk for heart disease, you may need your cholesterol levels checked more frequently.Ongoing high lipid and cholesterol levels should be treated with medicines if diet and exercise are not effective.  If you smoke, find out from your caregiver how to quit. If you do not use tobacco, do not start.  Lung cancer screening is recommended for adults aged 55 80 years who are at high risk for developing lung cancer because of a history of smoking. Yearly low-dose computed tomography (CT) is recommended for people who have at least a 30-pack-year history of smoking and are a current smoker or have quit within the past 15 years. A pack year of smoking is smoking an average of 1 pack of cigarettes a day for 1 year (for example: 1 pack a day for 30 years or 2 packs a day for 15 years). Yearly screening should continue until the smoker has stopped smoking for at least 15 years. Yearly screening should also be stopped for people who develop a health problem that would prevent them from having lung cancer treatment.  If you are pregnant, do not drink alcohol. If you are breastfeeding, be very cautious about drinking alcohol. If you are   not pregnant and choose to drink alcohol, do not exceed 1 drink per day. One drink is considered to be 12 ounces (355 mL) of beer, 5 ounces (148 mL) of wine, or 1.5 ounces (44 mL) of liquor.  Avoid use of street drugs. Do not share needles with anyone. Ask for help if you need support or instructions about stopping the use of drugs.  High blood pressure causes heart disease and increases the risk of stroke. Your blood pressure  should be checked at least every 1 to 2 years. Ongoing high blood pressure should be treated with medicines if weight loss and exercise are not effective.  If you are 55 to 58 years old, ask your caregiver if you should take aspirin to prevent strokes.  Diabetes screening involves taking a blood sample to check your fasting blood sugar level. This should be done once every 3 years, after age 45, if you are within normal weight and without risk factors for diabetes. Testing should be considered at a younger age or be carried out more frequently if you are overweight and have at least 1 risk factor for diabetes.  Breast cancer screening is essential preventive care for women. You should practice "breast self-awareness." This means understanding the normal appearance and feel of your breasts and may include breast self-examination. Any changes detected, no matter how small, should be reported to a caregiver. Women in their 20s and 30s should have a clinical breast exam (CBE) by a caregiver as part of a regular health exam every 1 to 3 years. After age 40, women should have a CBE every year. Starting at age 40, women should consider having a mammography (breast X-ray test) every year. Women who have a family history of breast cancer should talk to their caregiver about genetic screening. Women at a high risk of breast cancer should talk to their caregivers about having magnetic resonance imaging (MRI) and a mammography every year.  Breast cancer gene (BRCA)-related cancer risk assessment is recommended for women who have family members with BRCA-related cancers. BRCA-related cancers include breast, ovarian, tubal, and peritoneal cancers. Having family members with these cancers may be associated with an increased risk for harmful changes (mutations) in the breast cancer genes BRCA1 and BRCA2. Results of the assessment will determine the need for genetic counseling and BRCA1 and BRCA2 testing.  The Pap test is  a screening test for cervical cancer. A Pap test can show cell changes on the cervix that might become cervical cancer if left untreated. A Pap test is a procedure in which cells are obtained and examined from the lower end of the uterus (cervix).  Women should have a Pap test starting at age 21.  Between ages 21 and 29, Pap tests should be repeated every 2 years.  Beginning at age 30, you should have a Pap test every 3 years as long as the past 3 Pap tests have been normal.  Some women have medical problems that increase the chance of getting cervical cancer. Talk to your caregiver about these problems. It is especially important to talk to your caregiver if a new problem develops soon after your last Pap test. In these cases, your caregiver may recommend more frequent screening and Pap tests.  The above recommendations are the same for women who have or have not gotten the vaccine for human papillomavirus (HPV).  If you had a hysterectomy for a problem that was not cancer or a condition that could lead to cancer, then   you no longer need Pap tests. Even if you no longer need a Pap test, a regular exam is a good idea to make sure no other problems are starting.  If you are between ages 65 and 70, and you have had normal Pap tests going back 10 years, you no longer need Pap tests. Even if you no longer need a Pap test, a regular exam is a good idea to make sure no other problems are starting.  If you have had past treatment for cervical cancer or a condition that could lead to cancer, you need Pap tests and screening for cancer for at least 20 years after your treatment.  If Pap tests have been discontinued, risk factors (such as a new sexual partner) need to be reassessed to determine if screening should be resumed.  The HPV test is an additional test that may be used for cervical cancer screening. The HPV test looks for the virus that can cause the cell changes on the cervix. The cells collected  during the Pap test can be tested for HPV. The HPV test could be used to screen women aged 30 years and older, and should be used in women of any age who have unclear Pap test results. After the age of 30, women should have HPV testing at the same frequency as a Pap test.  Colorectal cancer can be detected and often prevented. Most routine colorectal cancer screening begins at the age of 50 and continues through age 75. However, your caregiver may recommend screening at an earlier age if you have risk factors for colon cancer. On a yearly basis, your caregiver may provide home test kits to check for hidden blood in the stool. Use of a small camera at the end of a tube, to directly examine the colon (sigmoidoscopy or colonoscopy), can detect the earliest forms of colorectal cancer. Talk to your caregiver about this at age 50, when routine screening begins. Direct examination of the colon should be repeated every 5 to 10 years through age 75, unless early forms of pre-cancerous polyps or small growths are found.  Hepatitis C blood testing is recommended for all people born from 1945 through 1965 and any individual with known risks for hepatitis C.  Practice safe sex. Use condoms and avoid high-risk sexual practices to reduce the spread of sexually transmitted infections (STIs). STIs include gonorrhea, chlamydia, syphilis, trichomonas, herpes, HPV, and human immunodeficiency virus (HIV). Herpes, HIV, and HPV are viral illnesses that have no cure. They can result in disability, cancer, and death. Sexually active women aged 25 and younger should be checked for chlamydia. Older women with new or multiple partners should also be tested for chlamydia. Testing for other STIs is recommended if you are sexually active and at increased risk.  Osteoporosis is a disease in which the bones lose minerals and strength with aging. This can result in serious bone fractures. The risk of osteoporosis can be identified using a  bone density scan. Women ages 65 and over and women at risk for fractures or osteoporosis should discuss screening with their caregivers. Ask your caregiver whether you should take a calcium supplement or vitamin D to reduce the rate of osteoporosis.  Menopause can be associated with physical symptoms and risks. Hormone replacement therapy is available to decrease symptoms and risks. You should talk to your caregiver about whether hormone replacement therapy is right for you.  Use sunscreen. Apply sunscreen liberally and repeatedly throughout the day. You should seek shade   when your shadow is shorter than you. Protect yourself by wearing long sleeves, pants, a wide-brimmed hat, and sunglasses year round, whenever you are outdoors.  Once a month, do a whole body skin exam, using a mirror to look at the skin on your back. Notify your caregiver of new moles, moles that have irregular borders, moles that are larger than a pencil eraser, or moles that have changed in shape or color.  Stay current with required immunizations.  Influenza vaccine. All adults should be immunized every year.  Tetanus, diphtheria, and acellular pertussis (Td, Tdap) vaccine. Pregnant women should receive 1 dose of Tdap vaccine during each pregnancy. The dose should be obtained regardless of the length of time since the last dose. Immunization is preferred during the 27th to 36th week of gestation. An adult who has not previously received Tdap or who does not know her vaccine status should receive 1 dose of Tdap. This initial dose should be followed by tetanus and diphtheria toxoids (Td) booster doses every 10 years. Adults with an unknown or incomplete history of completing a 3-dose immunization series with Td-containing vaccines should begin or complete a primary immunization series including a Tdap dose. Adults should receive a Td booster every 10 years.  Varicella vaccine. An adult without evidence of immunity to varicella  should receive 2 doses or a second dose if she has previously received 1 dose. Pregnant females who do not have evidence of immunity should receive the first dose after pregnancy. This first dose should be obtained before leaving the health care facility. The second dose should be obtained 4 8 weeks after the first dose.  Human papillomavirus (HPV) vaccine. Females aged 13 26 years who have not received the vaccine previously should obtain the 3-dose series. The vaccine is not recommended for use in pregnant females. However, pregnancy testing is not needed before receiving a dose. If a female is found to be pregnant after receiving a dose, no treatment is needed. In that case, the remaining doses should be delayed until after the pregnancy. Immunization is recommended for any person with an immunocompromised condition through the age of 26 years if she did not get any or all doses earlier. During the 3-dose series, the second dose should be obtained 4 8 weeks after the first dose. The third dose should be obtained 24 weeks after the first dose and 16 weeks after the second dose.  Zoster vaccine. One dose is recommended for adults aged 60 years or older unless certain conditions are present.  Measles, mumps, and rubella (MMR) vaccine. Adults born before 1957 generally are considered immune to measles and mumps. Adults born in 1957 or later should have 1 or more doses of MMR vaccine unless there is a contraindication to the vaccine or there is laboratory evidence of immunity to each of the three diseases. A routine second dose of MMR vaccine should be obtained at least 28 days after the first dose for students attending postsecondary schools, health care workers, or international travelers. People who received inactivated measles vaccine or an unknown type of measles vaccine during 1963 1967 should receive 2 doses of MMR vaccine. People who received inactivated mumps vaccine or an unknown type of mumps vaccine  before 1979 and are at high risk for mumps infection should consider immunization with 2 doses of MMR vaccine. For females of childbearing age, rubella immunity should be determined. If there is no evidence of immunity, females who are not pregnant should be vaccinated. If there   is no evidence of immunity, females who are pregnant should delay immunization until after pregnancy. Unvaccinated health care workers born before 1957 who lack laboratory evidence of measles, mumps, or rubella immunity or laboratory confirmation of disease should consider measles and mumps immunization with 2 doses of MMR vaccine or rubella immunization with 1 dose of MMR vaccine.  Pneumococcal 13-valent conjugate (PCV13) vaccine. When indicated, a person who is uncertain of her immunization history and has no record of immunization should receive the PCV13 vaccine. An adult aged 19 years or older who has certain medical conditions and has not been previously immunized should receive 1 dose of PCV13 vaccine. This PCV13 should be followed with a dose of pneumococcal polysaccharide (PPSV23) vaccine. The PPSV23 vaccine dose should be obtained at least 8 weeks after the dose of PCV13 vaccine. An adult aged 19 years or older who has certain medical conditions and previously received 1 or more doses of PPSV23 vaccine should receive 1 dose of PCV13. The PCV13 vaccine dose should be obtained 1 or more years after the last PPSV23 vaccine dose.  Pneumococcal polysaccharide (PPSV23) vaccine. When PCV13 is also indicated, PCV13 should be obtained first. All adults aged 65 years and older should be immunized. An adult younger than age 65 years who has certain medical conditions should be immunized. Any person who resides in a nursing home or long-term care facility should be immunized. An adult smoker should be immunized. People with an immunocompromised condition and certain other conditions should receive both PCV13 and PPSV23 vaccines. People  with human immunodeficiency virus (HIV) infection should be immunized as soon as possible after diagnosis. Immunization during chemotherapy or radiation therapy should be avoided. Routine use of PPSV23 vaccine is not recommended for American Indians, [place] Natives, or people younger than 65 years unless there are medical conditions that require PPSV23 vaccine. When indicated, people who have unknown immunization and have no record of immunization should receive PPSV23 vaccine. One-time revaccination 5 years after the first dose of PPSV23 is recommended for people aged 19 64 years who have chronic kidney failure, nephrotic syndrome, asplenia, or immunocompromised conditions. People who received 1 2 doses of PPSV23 before age 65 years should receive another dose of PPSV23 vaccine at age 65 years or later if at least 5 years have passed since the previous dose. Doses of PPSV23 are not needed for people immunized with PPSV23 at or after age 65 years.  Meningococcal vaccine. Adults with asplenia or persistent complement component deficiencies should receive 2 doses of quadrivalent meningococcal conjugate (MenACWY-D) vaccine. The doses should be obtained at least 2 months apart. Microbiologists working with certain meningococcal bacteria, military recruits, people at risk during an outbreak, and people who travel to or live in countries with a high rate of meningitis should be immunized. A first-year college student up through age 21 years who is living in a residence hall should receive a dose if she did not receive a dose on or after her 16th birthday. Adults who have certain high-risk conditions should receive one or more doses of vaccine.  Hepatitis A vaccine. Adults who wish to be protected from this disease, have certain high-risk conditions, work with hepatitis A-infected animals, work in hepatitis A research labs, or travel to or work in countries with a high rate of hepatitis A should be immunized. Adults  who were previously unvaccinated and who anticipate close contact with an international adoptee during the first 60 days after arrival in the [place] from a country   with a high rate of hepatitis A should be immunized.  Hepatitis B vaccine. Adults who wish to be protected from this disease, have certain high-risk conditions, may be exposed to blood or other infectious body fluids, are household contacts or sex partners of hepatitis B positive people, are clients or workers in certain care facilities, or travel to or work in countries with a high rate of hepatitis B should be immunized.  Haemophilus influenzae type b (Hib) vaccine. A previously unvaccinated person with asplenia or sickle cell disease or having a scheduled splenectomy should receive 1 dose of Hib vaccine. Regardless of previous immunization, a recipient of a hematopoietic stem cell transplant should receive a 3-dose series 6 12 months after her successful transplant. Hib vaccine is not recommended for adults with HIV infection. Preventive Services / Frequency Ages 19 to 39  Blood pressure check.** / Every 1 to 2 years.  Lipid and cholesterol check.** / Every 5 years beginning at age 20.  Clinical breast exam.** / Every 3 years for women in their 20s and 30s.  BRCA-related cancer risk assessment.** / For women who have family members with a BRCA-related cancer (breast, ovarian, tubal, or peritoneal cancers).  Pap test.** / Every 2 years from ages 21 through 29. Every 3 years starting at age 30 through age 65 or 70 with a history of 3 consecutive normal Pap tests.  HPV screening.** / Every 3 years from ages 30 through ages 65 to 70 with a history of 3 consecutive normal Pap tests.  Hepatitis C blood test.** / For any individual with known risks for hepatitis C.  Skin self-exam. / Monthly.  Influenza vaccine. / Every year.  Tetanus, diphtheria, and acellular pertussis (Tdap, Td) vaccine.** / Consult your caregiver. Pregnant  women should receive 1 dose of Tdap vaccine during each pregnancy. 1 dose of Td every 10 years.  Varicella vaccine.** / Consult your caregiver. Pregnant females who do not have evidence of immunity should receive the first dose after pregnancy.  HPV vaccine. / 3 doses over 6 months, if 26 and younger. The vaccine is not recommended for use in pregnant females. However, pregnancy testing is not needed before receiving a dose.  Measles, mumps, rubella (MMR) vaccine.** / You need at least 1 dose of MMR if you were born in 1957 or later. You may also need a 2nd dose. For females of childbearing age, rubella immunity should be determined. If there is no evidence of immunity, females who are not pregnant should be vaccinated. If there is no evidence of immunity, females who are pregnant should delay immunization until after pregnancy.  Pneumococcal 13-valent conjugate (PCV13) vaccine.** / Consult your caregiver.  Pneumococcal polysaccharide (PPSV23) vaccine.** / 1 to 2 doses if you smoke cigarettes or if you have certain conditions.  Meningococcal vaccine.** / 1 dose if you are age 19 to 21 years and a first-year college student living in a residence hall, or have one of several medical conditions, you need to get vaccinated against meningococcal disease. You may also need additional booster doses.  Hepatitis A vaccine.** / Consult your caregiver.  Hepatitis B vaccine.** / Consult your caregiver.  Haemophilus influenzae type b (Hib) vaccine.** / Consult your caregiver. Ages 40 to 64  Blood pressure check.** / Every 1 to 2 years.  Lipid and cholesterol check.** / Every 5 years beginning at age 20.  Lung cancer screening. / Every year if you are aged 55 80 years and have a 30-pack-year history of smoking and   currently smoke or have quit within the past 15 years. Yearly screening is stopped once you have quit smoking for at least 15 years or develop a health problem that would prevent you from having  lung cancer treatment.  Clinical breast exam.** / Every year after age 40.  BRCA-related cancer risk assessment.** / For women who have family members with a BRCA-related cancer (breast, ovarian, tubal, or peritoneal cancers).  Mammogram.** / Every year beginning at age 40 and continuing for as long as you are in good health. Consult with your caregiver.  Pap test.** / Every 3 years starting at age 30 through age 65 or 70 with a history of 3 consecutive normal Pap tests.  HPV screening.** / Every 3 years from ages 30 through ages 65 to 70 with a history of 3 consecutive normal Pap tests.  Fecal occult blood test (FOBT) of stool. / Every year beginning at age 50 and continuing until age 75. You may not need to do this test if you get a colonoscopy every 10 years.  Flexible sigmoidoscopy or colonoscopy.** / Every 5 years for a flexible sigmoidoscopy or every 10 years for a colonoscopy beginning at age 50 and continuing until age 75.  Hepatitis C blood test.** / For all people born from 1945 through 1965 and any individual with known risks for hepatitis C.  Skin self-exam. / Monthly.  Influenza vaccine. / Every year.  Tetanus, diphtheria, and acellular pertussis (Tdap/Td) vaccine.** / Consult your caregiver. Pregnant women should receive 1 dose of Tdap vaccine during each pregnancy. 1 dose of Td every 10 years.  Varicella vaccine.** / Consult your caregiver. Pregnant females who do not have evidence of immunity should receive the first dose after pregnancy.  Zoster vaccine.** / 1 dose for adults aged 60 years or older.  Measles, mumps, rubella (MMR) vaccine.** / You need at least 1 dose of MMR if you were born in 1957 or later. You may also need a 2nd dose. For females of childbearing age, rubella immunity should be determined. If there is no evidence of immunity, females who are not pregnant should be vaccinated. If there is no evidence of immunity, females who are pregnant should delay  immunization until after pregnancy.  Pneumococcal 13-valent conjugate (PCV13) vaccine.** / Consult your caregiver.  Pneumococcal polysaccharide (PPSV23) vaccine.** / 1 to 2 doses if you smoke cigarettes or if you have certain conditions.  Meningococcal vaccine.** / Consult your caregiver.  Hepatitis A vaccine.** / Consult your caregiver.  Hepatitis B vaccine.** / Consult your caregiver.  Haemophilus influenzae type b (Hib) vaccine.** / Consult your caregiver. Ages 65 and over  Blood pressure check.** / Every 1 to 2 years.  Lipid and cholesterol check.** / Every 5 years beginning at age 20.  Lung cancer screening. / Every year if you are aged 55 80 years and have a 30-pack-year history of smoking and currently smoke or have quit within the past 15 years. Yearly screening is stopped once you have quit smoking for at least 15 years or develop a health problem that would prevent you from having lung cancer treatment.  Clinical breast exam.** / Every year after age 40.  BRCA-related cancer risk assessment.** / For women who have family members with a BRCA-related cancer (breast, ovarian, tubal, or peritoneal cancers).  Mammogram.** / Every year beginning at age 40 and continuing for as long as you are in good health. Consult with your caregiver.  Pap test.** / Every 3 years starting at age   30 through age 65 or 70 with a 3 consecutive normal Pap tests. Testing can be stopped between 65 and 70 with 3 consecutive normal Pap tests and no abnormal Pap or HPV tests in the past 10 years.  HPV screening.** / Every 3 years from ages 30 through ages 65 or 70 with a history of 3 consecutive normal Pap tests. Testing can be stopped between 65 and 70 with 3 consecutive normal Pap tests and no abnormal Pap or HPV tests in the past 10 years.  Fecal occult blood test (FOBT) of stool. / Every year beginning at age 50 and continuing until age 75. You may not need to do this test if you get a colonoscopy  every 10 years.  Flexible sigmoidoscopy or colonoscopy.** / Every 5 years for a flexible sigmoidoscopy or every 10 years for a colonoscopy beginning at age 50 and continuing until age 75.  Hepatitis C blood test.** / For all people born from 1945 through 1965 and any individual with known risks for hepatitis C.  Osteoporosis screening.** / A one-time screening for women ages 65 and over and women at risk for fractures or osteoporosis.  Skin self-exam. / Monthly.  Influenza vaccine. / Every year.  Tetanus, diphtheria, and acellular pertussis (Tdap/Td) vaccine.** / 1 dose of Td every 10 years.  Varicella vaccine.** / Consult your caregiver.  Zoster vaccine.** / 1 dose for adults aged 60 years or older.  Pneumococcal 13-valent conjugate (PCV13) vaccine.** / Consult your caregiver.  Pneumococcal polysaccharide (PPSV23) vaccine.** / 1 dose for all adults aged 65 years and older.  Meningococcal vaccine.** / Consult your caregiver.  Hepatitis A vaccine.** / Consult your caregiver.  Hepatitis B vaccine.** / Consult your caregiver.  Haemophilus influenzae type b (Hib) vaccine.** / Consult your caregiver. ** Family history and personal history of risk and conditions may change your caregiver's recommendations. Document Released: 01/02/2002 Document Revised: 03/03/2013 Document Reviewed: 04/03/2011 Exit[employer] Patient Information 2014 [employer].  

## 2013-09-26 NOTE — Progress Notes (Signed)
  Subjective:    Patient ID: Brianna Lambert, female    DOB: 1956-09-18, 57 y.o.   MRN: [id number]  Hypertension This is a chronic problem. The problem is unchanged. The problem is controlled. Pertinent negatives include no anxiety, blurred vision, chest pain, headaches, malaise/fatigue, neck pain, orthopnea, palpitations, peripheral edema, PND, shortness of breath or sweats. Past treatments include angiotensin blockers. The current treatment provides significant improvement. Compliance problems include exercise and diet.       Review of Systems  Constitutional: Positive for unexpected weight change (weight gain). Negative for fever, chills, malaise/fatigue, diaphoresis, activity change, appetite change and fatigue.  HENT: Negative.   Eyes: Negative.  Negative for blurred vision and discharge.  Respiratory: Negative.  Negative for apnea, cough, choking, chest tightness, shortness of breath, wheezing and stridor.   Cardiovascular: Negative.  Negative for chest pain, palpitations, orthopnea and PND.  Gastrointestinal: Negative.  Negative for nausea, vomiting, abdominal pain, constipation and blood in stool.  Endocrine: Negative.   Genitourinary: Negative.   Musculoskeletal: Negative.  Negative for arthralgias, back pain, gait problem, joint swelling, myalgias, neck pain and neck stiffness.  Skin: Negative.   Allergic/Immunologic: Negative.   Neurological: Negative.  Negative for dizziness, tremors, weakness, light-headedness and headaches.  Hematological: Negative.  Negative for adenopathy. Does not bruise/bleed easily.  Psychiatric/Behavioral: Negative.        Objective:   Physical Exam  Vitals reviewed. Constitutional: She is oriented to person, place, and time. She appears well-developed and well-nourished. No distress.  HENT:  Head: Normocephalic and atraumatic.  Mouth/Throat: Oropharynx is clear and moist. No oropharyngeal exudate.  Eyes: Conjunctivae are normal. Right eye  exhibits no discharge. Left eye exhibits no discharge. No scleral icterus.  Neck: Normal range of motion. Neck supple. No JVD present. No tracheal deviation present. No thyromegaly present.  Cardiovascular: Normal rate, regular rhythm, normal heart sounds and intact distal pulses.  Exam reveals no gallop and no friction rub.   No murmur heard. Pulmonary/Chest: Effort normal and breath sounds normal. No stridor. No respiratory distress. She has no wheezes. She has no rales. She exhibits no tenderness.  Abdominal: Soft. Bowel sounds are normal. She exhibits no distension and no mass. There is no tenderness. There is no rebound and no guarding.  Musculoskeletal: Normal range of motion. She exhibits no edema and no tenderness.  Lymphadenopathy:    She has no cervical adenopathy.  Neurological: She is oriented to person, place, and time.  Skin: Skin is warm and dry. No rash noted. She is not diaphoretic. No erythema. No pallor.  Psychiatric: She has a normal mood and affect. Her behavior is normal. Judgment and thought content normal.     Lab Results  Component Value Date   WBC 7.4 07/11/2012   HGB 14.0 07/11/2012   HCT 42.3 07/11/2012   PLT 271.0 07/11/2012   GLUCOSE 135* 08/01/2012   CHOL 223* 07/11/2012   TRIG 104.0 07/11/2012   HDL 51.20 07/11/2012   LDLDIRECT 155.6 07/11/2012   ALT 23 07/11/2012   AST 20 07/11/2012   NA 138 08/01/2012   K 4.4 08/01/2012   CL 103 08/01/2012   CREATININE 0.8 08/01/2012   BUN 14 08/01/2012   CO2 27 08/01/2012   TSH 0.85 07/11/2012   INR 0.97 10/27/2011   HGBA1C 7.0* 07/04/2013       Assessment & Plan:

## 2013-09-26 NOTE — Progress Notes (Signed)
Pre visit review using our clinic review tool, if applicable. No additional management support is needed unless otherwise documented below in the visit note. 

## 2013-09-28 ENCOUNTER — Encounter: Payer: Self-pay | Admitting: Internal Medicine

## 2013-09-28 NOTE — Assessment & Plan Note (Signed)
Her BP is well controlled 

## 2013-09-28 NOTE — Assessment & Plan Note (Signed)
I will check her A1C and will monitor her renal function 

## 2013-09-28 NOTE — Assessment & Plan Note (Signed)
Exam done Vaccines were reviewed Labs ordered Pt ed material was given 

## 2013-11-25 ENCOUNTER — Encounter: Payer: Self-pay | Admitting: Internal Medicine

## 2013-12-04 ENCOUNTER — Telehealth: Payer: Self-pay | Admitting: *Deleted

## 2013-12-04 DIAGNOSIS — E1165 Type 2 diabetes mellitus with hyperglycemia: Secondary | ICD-10-CM

## 2013-12-04 DIAGNOSIS — IMO0001 Reserved for inherently not codable concepts without codable children: Secondary | ICD-10-CM

## 2013-12-04 DIAGNOSIS — I10 Essential (primary) hypertension: Secondary | ICD-10-CM

## 2013-12-04 MED ORDER — MONTELUKAST SODIUM 10 MG PO TABS: 10.0000 mg | ORAL_TABLET | Freq: Every day | ORAL | Status: DC

## 2013-12-04 MED ORDER — OLMESARTAN MEDOXOMIL 40 MG PO TABS: 40.0000 mg | ORAL_TABLET | Freq: Every day | ORAL | Status: DC

## 2013-12-04 MED ORDER — OMEPRAZOLE-SODIUM BICARBONATE 20-1100 MG PO CAPS: 1.0000 | ORAL_CAPSULE | Freq: Two times a day (BID) | ORAL | Status: DC

## 2013-12-04 NOTE — Telephone Encounter (Signed)
Please disregard info in previous documentation regarding fibromyalgia....entered in error.

## 2013-12-04 NOTE — Telephone Encounter (Signed)
Patient phoned requesting that her fibromyalgia medication be "adjusted" r/t ineffectiveness.  She also is requesting refills for her Benicar, Zegerid, and her Singulair.  Her last OV with PCP 09/26/13.  Meds refilled per protocol. Pt notified & clarified 90 day supply via mail order pharmacy.  Samples for 2 weeks worth of Benicar 40mg  left for her to p/u while awaiting scripts from mail order pharmacy.

## 2013-12-09 ENCOUNTER — Other Ambulatory Visit: Payer: Self-pay

## 2013-12-09 MED ORDER — METFORMIN HCL ER (OSM) 500 MG PO TB24: 1000.0000 mg | ORAL_TABLET | Freq: Two times a day (BID) | ORAL | Status: DC

## 2013-12-12 ENCOUNTER — Telehealth: Payer: Self-pay

## 2013-12-12 MED ORDER — METFORMIN HCL ER (MOD) 500 MG PO TB24: ORAL_TABLET | ORAL | Status: DC

## 2013-12-12 NOTE — Telephone Encounter (Signed)
[facility] requesting a refill for [employer]. Current medication list states that she is currently taking Metformin Fortamet.  Please advise,  Thanks!

## 2013-12-12 NOTE — Telephone Encounter (Signed)
Done

## 2013-12-12 NOTE — Telephone Encounter (Signed)
Either, or generic is ok

## 2014-02-13 ENCOUNTER — Other Ambulatory Visit: Payer: Self-pay | Admitting: Endocrinology

## 2014-05-19 ENCOUNTER — Other Ambulatory Visit: Payer: Self-pay | Admitting: Internal Medicine

## 2014-05-19 ENCOUNTER — Telehealth: Payer: Self-pay | Admitting: *Deleted

## 2014-05-19 DIAGNOSIS — E1165 Type 2 diabetes mellitus with hyperglycemia: Principal | ICD-10-CM

## 2014-05-19 DIAGNOSIS — IMO0001 Reserved for inherently not codable concepts without codable children: Secondary | ICD-10-CM

## 2014-05-19 DIAGNOSIS — E785 Hyperlipidemia, unspecified: Secondary | ICD-10-CM

## 2014-05-19 NOTE — Telephone Encounter (Signed)
A1c also ordered

## 2014-05-19 NOTE — Telephone Encounter (Signed)
Patient is going to her son's wedding but will come in next week for her lipid panel - FYI

## 2014-06-29 LAB — HM DIABETES EYE EXAM

## 2014-07-08 ENCOUNTER — Other Ambulatory Visit: Payer: Self-pay | Admitting: Orthopedic Surgery

## 2014-07-08 DIAGNOSIS — M25532 Pain in left wrist: Secondary | ICD-10-CM

## 2014-07-09 LAB — HM DIABETES EYE EXAM

## 2014-07-10 ENCOUNTER — Ambulatory Visit
Admission: RE | Admit: 2014-07-10 | Discharge: 2014-07-10 | Disposition: A | Discharge status: Home / Self Care | Payer: 59 | Source: Ambulatory Visit | Attending: Orthopedic Surgery | Admitting: Orthopedic Surgery

## 2014-07-10 DIAGNOSIS — M25532 Pain in left wrist: Secondary | ICD-10-CM

## 2014-07-13 ENCOUNTER — Other Ambulatory Visit: Payer: 59

## 2014-07-29 ENCOUNTER — Telehealth: Payer: Self-pay

## 2014-07-29 NOTE — Telephone Encounter (Signed)
Received a refill request for Metformin. Medication is not on current medciaiton list.  Please advise,  Thanks!

## 2014-07-29 NOTE — Telephone Encounter (Signed)
On med list, metformin is called "glumetza." Refill x 1 Ov is due

## 2014-07-30 MED ORDER — METFORMIN HCL ER (MOD) 500 MG PO TB24: ORAL_TABLET | ORAL | Status: DC

## 2014-07-30 NOTE — Telephone Encounter (Signed)
Rx sent to pt's pharmacy

## 2014-08-18 ENCOUNTER — Ambulatory Visit (INDEPENDENT_AMBULATORY_CARE_PROVIDER_SITE_OTHER): Payer: 59 | Admitting: Internal Medicine

## 2014-08-18 ENCOUNTER — Other Ambulatory Visit (INDEPENDENT_AMBULATORY_CARE_PROVIDER_SITE_OTHER): Payer: 59

## 2014-08-18 ENCOUNTER — Ambulatory Visit (INDEPENDENT_AMBULATORY_CARE_PROVIDER_SITE_OTHER)
Admission: RE | Admit: 2014-08-18 | Discharge: 2014-08-18 | Disposition: A | Discharge status: Home / Self Care | Payer: 59 | Source: Ambulatory Visit | Attending: Internal Medicine | Admitting: Internal Medicine

## 2014-08-18 ENCOUNTER — Encounter: Payer: Self-pay | Admitting: Internal Medicine

## 2014-08-18 ENCOUNTER — Other Ambulatory Visit: Payer: Self-pay | Admitting: Internal Medicine

## 2014-08-18 VITALS — BP 120/80 | HR 60 | Temp 98.0°F | Resp 16 | Wt 290.0 lb

## 2014-08-18 DIAGNOSIS — E785 Hyperlipidemia, unspecified: Secondary | ICD-10-CM

## 2014-08-18 DIAGNOSIS — R079 Chest pain, unspecified: Secondary | ICD-10-CM

## 2014-08-18 DIAGNOSIS — E1165 Type 2 diabetes mellitus with hyperglycemia: Secondary | ICD-10-CM

## 2014-08-18 DIAGNOSIS — IMO0001 Reserved for inherently not codable concepts without codable children: Secondary | ICD-10-CM

## 2014-08-18 DIAGNOSIS — I10 Essential (primary) hypertension: Secondary | ICD-10-CM

## 2014-08-18 LAB — COMPREHENSIVE METABOLIC PANEL
ALBUMIN: 3.5 g/dL (ref 3.5–5.2)
ALK PHOS: 51 U/L (ref 39–117)
ALT: 31 U/L (ref 0–35)
AST: 28 U/L (ref 0–37)
BUN: 11 mg/dL (ref 6–23)
CALCIUM: 8.8 mg/dL (ref 8.4–10.5)
CO2: 25 mEq/L (ref 19–32)
Chloride: 105 mEq/L (ref 96–112)
Creatinine, Ser: 0.9 mg/dL (ref 0.4–1.2)
GFR: 72.73 mL/min (ref >60.00)
Glucose, Bld: 138 mg/dL — ABNORMAL HIGH (ref 70–99)
POTASSIUM: 4 meq/L (ref 3.5–5.1)
SODIUM: 135 meq/L (ref 135–145)
TOTAL PROTEIN: 6.8 g/dL (ref 6.0–8.3)
Total Bilirubin: 0.3 mg/dL (ref 0.2–1.2)

## 2014-08-18 LAB — HEMOGLOBIN A1C: HEMOGLOBIN A1C: 7.7 % — AB (ref 4.6–6.5)

## 2014-08-18 LAB — CBC WITH DIFFERENTIAL/PLATELET
Basophils Absolute: 0.1 10*3/uL (ref 0.0–0.1)
Basophils Relative: 0.7 % (ref 0.0–3.0)
EOS ABS: 0.2 10*3/uL (ref 0.0–0.7)
EOS PCT: 1.9 % (ref 0.0–5.0)
HCT: 40.4 % (ref 36.0–46.0)
Hemoglobin: 13.5 g/dL (ref 12.0–15.0)
Lymphocytes Relative: 28.8 % (ref 12.0–46.0)
Lymphs Abs: 2.4 10*3/uL (ref 0.7–4.0)
MCHC: 33.3 g/dL (ref 30.0–36.0)
MCV: 87.2 fl (ref 78.0–100.0)
Monocytes Absolute: 0.5 10*3/uL (ref 0.1–1.0)
Monocytes Relative: 6 % (ref 3.0–12.0)
NEUTROS PCT: 62.6 % (ref 43.0–77.0)
Neutro Abs: 5.2 10*3/uL (ref 1.4–7.7)
PLATELETS: 244 10*3/uL (ref 150.0–400.0)
RBC: 4.64 Mil/uL (ref 3.87–5.11)
RDW: 14.5 % (ref 11.5–15.5)
WBC: 8.3 10*3/uL (ref 4.0–10.5)

## 2014-08-18 LAB — LIPID PANEL
CHOLESTEROL: 231 mg/dL — AB (ref 0–200)
HDL: 40.7 mg/dL (ref >39.00)
LDL Cholesterol: 153 mg/dL — ABNORMAL HIGH (ref 0–99)
NonHDL: 190.3
TRIGLYCERIDES: 188 mg/dL — AB (ref 0.0–149.0)
Total CHOL/HDL Ratio: 6
VLDL: 37.6 mg/dL (ref 0.0–40.0)

## 2014-08-18 LAB — TROPONIN I: Troponin I: <0.01 ng/mL (ref <0.06)

## 2014-08-18 LAB — D-DIMER, QUANTITATIVE (NOT AT ARMC): D DIMER QUANT: 0.29 ug{FEU}/mL (ref 0.00–0.48)

## 2014-08-18 LAB — CARDIAC PANEL
CK MB: 1.2 ng/mL (ref 0.3–4.0)
Relative Index: 1.5 calc (ref 0.0–2.5)
Total CK: 81 U/L (ref 7–177)

## 2014-08-18 LAB — TSH: TSH: 0.69 u[IU]/mL (ref 0.35–4.50)

## 2014-08-18 MED ORDER — IBUPROFEN 600 MG PO TABS: 600.0000 mg | ORAL_TABLET | Freq: Three times a day (TID) | ORAL | Status: DC | PRN

## 2014-08-18 NOTE — Progress Notes (Signed)
Pre visit review using our clinic review tool, if applicable. No additional management support is needed unless otherwise documented below in the visit note. 

## 2014-08-18 NOTE — Patient Instructions (Signed)

## 2014-08-18 NOTE — Assessment & Plan Note (Signed)
FLP today 

## 2014-08-18 NOTE — Assessment & Plan Note (Signed)
Her BP is well controlled Will check her lytes and renal function 

## 2014-08-18 NOTE — Assessment & Plan Note (Signed)
She is due for an A1C, will address if needed Will also monitor her renal function

## 2014-08-18 NOTE — Assessment & Plan Note (Signed)
Her EKG shows NSR with no Q waves and no ST/T wave changes Will check her CXR to look for a structural cause of pain and will check cardiac enzymes and d-dimer I think this is MS pain, will try nsaids for pain relief

## 2014-08-18 NOTE — Progress Notes (Signed)
Subjective:    Patient ID: Brianna Lambert, female    DOB: [birth year], 58 y.o.   MRN: [id number]  Chest Pain  This is a new problem. The current episode started in the past 7 days. The onset quality is gradual. The problem occurs intermittently. The problem has been unchanged. The pain is present in the lateral region. The pain is at a severity of 2/10. The pain is mild. The quality of the pain is described as dull. The pain does not radiate. Pertinent negatives include no abdominal pain, back pain, claudication, cough, diaphoresis, dizziness, exertional chest pressure, fever, headaches, hemoptysis, irregular heartbeat, leg pain, lower extremity edema, malaise/fatigue, nausea, near-syncope, numbness, orthopnea, palpitations, PND, shortness of breath, sputum production, syncope, vomiting or weakness. The pain is aggravated by movement (palpation). She has tried acetaminophen for the symptoms. The treatment provided no relief.      Review of Systems  Constitutional: Negative.  Negative for fever, chills, malaise/fatigue, diaphoresis, appetite change and fatigue.  HENT: Negative.  Negative for trouble swallowing.   Eyes: Negative.   Respiratory: Negative.  Negative for apnea, cough, hemoptysis, sputum production, choking, chest tightness, shortness of breath, wheezing and stridor.   Cardiovascular: Positive for chest pain. Negative for palpitations, orthopnea, claudication, leg swelling, syncope, PND and near-syncope.  Gastrointestinal: Negative.  Negative for nausea, vomiting, abdominal pain, diarrhea, constipation, abdominal distention, anal bleeding and rectal pain.  Endocrine: Negative.   Genitourinary: Negative.   Musculoskeletal: Negative.  Negative for arthralgias, back pain, joint swelling and myalgias.  Skin: Negative.  Negative for rash.  Allergic/Immunologic: Negative.   Neurological: Negative.  Negative for dizziness, weakness, numbness and headaches.  Hematological: Negative.   Negative for adenopathy. Does not bruise/bleed easily.  Psychiatric/Behavioral: Negative.        Objective:   Physical Exam  Vitals reviewed. Constitutional: She is oriented to person, place, and time. She appears well-developed and well-nourished.  Non-toxic appearance. She does not have a sickly appearance. She does not appear ill. No distress.    HENT:  Head: Normocephalic and atraumatic.  Mouth/Throat: Oropharynx is clear and moist. No oropharyngeal exudate.  Eyes: Conjunctivae are normal. Right eye exhibits no discharge. Left eye exhibits no discharge. No scleral icterus.  Neck: Normal range of motion. Neck supple. No JVD present. No tracheal deviation present. No thyromegaly present.  Cardiovascular: Normal rate, regular rhythm, normal heart sounds and intact distal pulses.  Exam reveals no gallop and no friction rub.   No murmur heard. Pulmonary/Chest: Effort normal and breath sounds normal. No stridor. No respiratory distress. She has no wheezes. She has no rales. She exhibits no tenderness.  Abdominal: Soft. Bowel sounds are normal. She exhibits no distension and no mass. There is no tenderness. There is no rebound and no guarding.  Musculoskeletal: Normal range of motion. She exhibits no edema and no tenderness.  Lymphadenopathy:    She has no cervical adenopathy.  Neurological: She is oriented to person, place, and time.  Skin: Skin is warm and dry. No rash noted. She is not diaphoretic. No erythema. No pallor.      Lab Results  Component Value Date   WBC 7.4 07/11/2012   HGB 14.0 07/11/2012   HCT 42.3 07/11/2012   PLT 271.0 07/11/2012   GLUCOSE 135* 08/01/2012   CHOL 223* 07/11/2012   TRIG 104.0 07/11/2012   HDL 51.20 07/11/2012   LDLDIRECT 155.6 07/11/2012   ALT 23 07/11/2012   AST 20 07/11/2012   NA 138 08/01/2012   K  4.4 08/01/2012   CL 103 08/01/2012   CREATININE 0.8 08/01/2012   BUN 14 08/01/2012   CO2 27 08/01/2012   TSH 0.85 07/11/2012   INR 0.97 10/27/2011   HGBA1C  7.0* 07/04/2013      Assessment & Plan:

## 2014-09-02 ENCOUNTER — Other Ambulatory Visit: Payer: Self-pay | Admitting: Endocrinology

## 2014-09-11 ENCOUNTER — Other Ambulatory Visit: Payer: Self-pay | Admitting: Internal Medicine

## 2014-09-16 ENCOUNTER — Telehealth: Payer: Self-pay | Admitting: Internal Medicine

## 2014-09-16 ENCOUNTER — Other Ambulatory Visit (INDEPENDENT_AMBULATORY_CARE_PROVIDER_SITE_OTHER): Payer: 59

## 2014-09-16 DIAGNOSIS — R300 Dysuria: Secondary | ICD-10-CM

## 2014-09-16 LAB — URINALYSIS, ROUTINE W REFLEX MICROSCOPIC
BILIRUBIN URINE: NEGATIVE
HGB URINE DIPSTICK: NEGATIVE
Ketones, ur: NEGATIVE
Nitrite: NEGATIVE
Specific Gravity, Urine: 1.01 (ref 1.000–1.030)
Total Protein, Urine: NEGATIVE
Urine Glucose: NEGATIVE
Urobilinogen, UA: 0.2 (ref 0.0–1.0)
pH: 6 (ref 5.0–8.0)

## 2014-09-16 NOTE — Telephone Encounter (Signed)
Notified pt md ok UA order has been place.../lmb 

## 2014-09-16 NOTE — Telephone Encounter (Signed)
yes

## 2014-09-16 NOTE — Telephone Encounter (Signed)
Pt has right lower back pain, urine seems more concentrated than usual and stronger odor. Pt thinks she may have urinary tract infection. Can she go to lab for urine specimen today? Does she need to be seen?  [phone]

## 2014-09-17 ENCOUNTER — Encounter: Payer: Self-pay | Admitting: Internal Medicine

## 2014-09-17 ENCOUNTER — Telehealth: Payer: Self-pay | Admitting: Internal Medicine

## 2014-09-17 MED ORDER — CIPROFLOXACIN HCL 250 MG PO TABS: 250.0000 mg | ORAL_TABLET | Freq: Two times a day (BID) | ORAL | Status: DC

## 2014-09-17 NOTE — Telephone Encounter (Signed)
UA was very slightly positive Urine culture is pending Cipro Rx sent in

## 2014-09-17 NOTE — Telephone Encounter (Signed)
Pt request result from the work that was done yesterday for a uti. Please call pt

## 2014-09-18 NOTE — Telephone Encounter (Signed)
Pt notified//lmovm 

## 2014-09-20 LAB — CULTURE, URINE COMPREHENSIVE: Colony Count: >=100000

## 2014-09-28 ENCOUNTER — Other Ambulatory Visit: Payer: Self-pay | Admitting: Endocrinology

## 2014-09-29 NOTE — Telephone Encounter (Signed)
Please advise if ok to refill. Pt has not been seen since 07/03/2013. Thanks!

## 2014-09-29 NOTE — Telephone Encounter (Signed)
Rx sent to pharmacy   

## 2014-09-29 NOTE — Telephone Encounter (Signed)
Please refill x 1 Ov is due  

## 2014-10-02 ENCOUNTER — Other Ambulatory Visit: Payer: Self-pay | Admitting: Endocrinology

## 2014-10-02 NOTE — Telephone Encounter (Signed)
Please refill x 1 Ov is due  

## 2014-10-02 NOTE — Telephone Encounter (Signed)
Please advise if ok to refill medication pt was last seen 07/03/2013.  Thanks!

## 2014-10-02 NOTE — Telephone Encounter (Signed)
Rx sent to pharmacy   

## 2014-10-26 ENCOUNTER — Other Ambulatory Visit: Payer: Self-pay | Admitting: Endocrinology

## 2014-11-26 ENCOUNTER — Other Ambulatory Visit: Payer: Self-pay | Admitting: Endocrinology

## 2014-11-27 NOTE — Telephone Encounter (Signed)
Please advise if ok to refill. Pt was last seen 06/2013.  Thanks!

## 2014-11-27 NOTE — Telephone Encounter (Signed)
Please refill x 1 Ov is due  

## 2014-12-10 ENCOUNTER — Other Ambulatory Visit: Payer: Self-pay | Admitting: Internal Medicine

## 2014-12-29 ENCOUNTER — Other Ambulatory Visit: Payer: Self-pay | Admitting: Endocrinology

## 2014-12-31 ENCOUNTER — Other Ambulatory Visit: Payer: Self-pay | Admitting: Endocrinology

## 2015-02-23 ENCOUNTER — Ambulatory Visit (INDEPENDENT_AMBULATORY_CARE_PROVIDER_SITE_OTHER): Payer: 59 | Admitting: Internal Medicine

## 2015-02-23 ENCOUNTER — Encounter: Payer: Self-pay | Admitting: Internal Medicine

## 2015-02-23 ENCOUNTER — Other Ambulatory Visit (INDEPENDENT_AMBULATORY_CARE_PROVIDER_SITE_OTHER): Payer: 59

## 2015-02-23 VITALS — BP 128/80 | HR 77 | Temp 98.4°F | Resp 16 | Wt 288.0 lb

## 2015-02-23 DIAGNOSIS — E1165 Type 2 diabetes mellitus with hyperglycemia: Secondary | ICD-10-CM | POA: Diagnosis not present

## 2015-02-23 DIAGNOSIS — E785 Hyperlipidemia, unspecified: Secondary | ICD-10-CM | POA: Diagnosis not present

## 2015-02-23 DIAGNOSIS — E784 Other hyperlipidemia: Secondary | ICD-10-CM

## 2015-02-23 DIAGNOSIS — I10 Essential (primary) hypertension: Secondary | ICD-10-CM

## 2015-02-23 DIAGNOSIS — IMO0002 Reserved for concepts with insufficient information to code with codable children: Secondary | ICD-10-CM

## 2015-02-23 DIAGNOSIS — J45998 Other asthma: Secondary | ICD-10-CM

## 2015-02-23 DIAGNOSIS — E118 Type 2 diabetes mellitus with unspecified complications: Secondary | ICD-10-CM

## 2015-02-23 LAB — URINALYSIS, ROUTINE W REFLEX MICROSCOPIC
BILIRUBIN URINE: NEGATIVE
Hgb urine dipstick: NEGATIVE
KETONES UR: NEGATIVE
LEUKOCYTES UA: NEGATIVE
NITRITE: NEGATIVE
SPECIFIC GRAVITY, URINE: 1.01 (ref 1.000–1.030)
Total Protein, Urine: NEGATIVE
UROBILINOGEN UA: 0.2 (ref 0.0–1.0)
Urine Glucose: NEGATIVE
pH: 5.5 (ref 5.0–8.0)

## 2015-02-23 LAB — MICROALBUMIN / CREATININE URINE RATIO
Creatinine,U: 90.2 mg/dL
MICROALB/CREAT RATIO: 0.8 mg/g (ref 0.0–30.0)

## 2015-02-23 LAB — LIPID PANEL
CHOL/HDL RATIO: 5
Cholesterol: 230 mg/dL — ABNORMAL HIGH (ref 0–200)
HDL: 46.5 mg/dL (ref >39.00)
LDL Cholesterol: 163 mg/dL — ABNORMAL HIGH (ref 0–99)
NONHDL: 183.5
Triglycerides: 104 mg/dL (ref 0.0–149.0)
VLDL: 20.8 mg/dL (ref 0.0–40.0)

## 2015-02-23 LAB — BASIC METABOLIC PANEL
BUN: 14 mg/dL (ref 6–23)
CHLORIDE: 102 meq/L (ref 96–112)
CO2: 25 meq/L (ref 19–32)
Calcium: 9.3 mg/dL (ref 8.4–10.5)
Creatinine, Ser: 0.8 mg/dL (ref 0.40–1.20)
GFR: 77.86 mL/min (ref >60.00)
GLUCOSE: 158 mg/dL — AB (ref 70–99)
POTASSIUM: 4.2 meq/L (ref 3.5–5.1)
Sodium: 135 mEq/L (ref 135–145)

## 2015-02-23 LAB — TSH: TSH: 0.74 u[IU]/mL (ref 0.35–4.50)

## 2015-02-23 LAB — HEMOGLOBIN A1C: HEMOGLOBIN A1C: 8 % — AB (ref 4.6–6.5)

## 2015-02-23 MED ORDER — METFORMIN HCL ER (MOD) 500 MG PO TB24: ORAL_TABLET | ORAL | Status: DC

## 2015-02-23 MED ORDER — OLMESARTAN MEDOXOMIL 40 MG PO TABS: ORAL_TABLET | ORAL | Status: DC

## 2015-02-23 MED ORDER — PITAVASTATIN CALCIUM 2 MG PO TABS: 1.0000 | ORAL_TABLET | Freq: Every day | ORAL | Status: DC

## 2015-02-23 MED ORDER — MONTELUKAST SODIUM 10 MG PO TABS: ORAL_TABLET | ORAL | Status: DC

## 2015-02-23 NOTE — Patient Instructions (Signed)

## 2015-02-23 NOTE — Progress Notes (Signed)
Subjective:    Patient ID: Brianna Lambert, female    DOB: November 09, 1956, 59 y.o.   MRN: [id number]  HPI Comments: crestor caused muscle aches so she stopped taking it and the muscle aches resolved  Diabetes She presents for her follow-up diabetic visit. She has type 2 diabetes mellitus. Her disease course has been stable. There are no hypoglycemic associated symptoms. Pertinent negatives for diabetes include no blurred vision, no chest pain, no fatigue, no foot paresthesias, no foot ulcerations, no polydipsia, no polyphagia, no polyuria, no visual change, no weakness and no weight loss. There are no hypoglycemic complications. There are no diabetic complications. Current diabetic treatment includes oral agent (monotherapy). She is compliant with treatment all of the time. Her weight is increasing steadily. She is following a generally unhealthy diet. When asked about meal planning, she reported none. She never participates in exercise. There is no change in her home blood glucose trend. An ACE inhibitor/angiotensin II receptor blocker is being taken. She does not see a podiatrist.Eye exam is current.      Review of Systems  Constitutional: Negative.  Negative for fever, chills, weight loss, diaphoresis, appetite change and fatigue.  HENT: Negative.   Eyes: Negative.  Negative for blurred vision.  Respiratory: Negative.  Negative for cough, choking, chest tightness, shortness of breath and stridor.   Cardiovascular: Negative.  Negative for chest pain, palpitations and leg swelling.  Gastrointestinal: Negative.  Negative for nausea, vomiting, abdominal pain, diarrhea, constipation and blood in stool.  Endocrine: Negative.  Negative for polydipsia, polyphagia and polyuria.  Genitourinary: Negative.  Negative for difficulty urinating.  Musculoskeletal: Negative.  Negative for myalgias, back pain, joint swelling and arthralgias.  Skin: Negative.  Negative for rash.  Allergic/Immunologic: Negative.    Neurological: Negative.  Negative for weakness.  Hematological: Negative.  Negative for adenopathy. Does not bruise/bleed easily.  Psychiatric/Behavioral: Negative.        Objective:   Physical Exam  Constitutional: She is oriented to person, place, and time. She appears well-developed and well-nourished. No distress.  HENT:  Head: Normocephalic and atraumatic.  Mouth/Throat: Oropharynx is clear and moist. No oropharyngeal exudate.  Eyes: Conjunctivae are normal. Right eye exhibits no discharge. Left eye exhibits no discharge. No scleral icterus.  Neck: Normal range of motion. Neck supple. No JVD present. No tracheal deviation present. No thyromegaly present.  Cardiovascular: Normal rate, regular rhythm, normal heart sounds and intact distal pulses.  Exam reveals no gallop and no friction rub.   No murmur heard. Pulmonary/Chest: Effort normal and breath sounds normal. No stridor. No respiratory distress. She has no wheezes. She has no rales. She exhibits no tenderness.  Abdominal: Soft. Bowel sounds are normal. She exhibits no distension and no mass. There is no tenderness. There is no rebound and no guarding.  Musculoskeletal: Normal range of motion. She exhibits no edema or tenderness.  Lymphadenopathy:    She has no cervical adenopathy.  Neurological: She is oriented to person, place, and time.  Skin: Skin is warm and dry. No rash noted. She is not diaphoretic. No erythema. No pallor.  Vitals reviewed.    Lab Results  Component Value Date   WBC 8.3 08/18/2014   HGB 13.5 08/18/2014   HCT 40.4 08/18/2014   PLT 244.0 08/18/2014   GLUCOSE 138* 08/18/2014   CHOL 231* 08/18/2014   TRIG 188.0* 08/18/2014   HDL 40.70 08/18/2014   LDLDIRECT 155.6 07/11/2012   LDLCALC 153* 08/18/2014   ALT 31 08/18/2014   AST  28 08/18/2014   NA 135 08/18/2014   K 4.0 08/18/2014   CL 105 08/18/2014   CREATININE 0.9 08/18/2014   BUN 11 08/18/2014   CO2 25 08/18/2014   TSH 0.69 08/18/2014    INR 0.97 10/27/2011   HGBA1C 7.7* 08/18/2014       Assessment & Plan:

## 2015-02-23 NOTE — Progress Notes (Signed)
Pre visit review using our clinic review tool, if applicable. No additional management support is needed unless otherwise documented below in the visit note. 

## 2015-02-24 ENCOUNTER — Other Ambulatory Visit: Payer: Self-pay | Admitting: Internal Medicine

## 2015-02-24 ENCOUNTER — Telehealth: Payer: Self-pay | Admitting: Internal Medicine

## 2015-02-24 ENCOUNTER — Encounter: Payer: Self-pay | Admitting: Internal Medicine

## 2015-02-24 ENCOUNTER — Telehealth: Payer: Self-pay

## 2015-02-24 ENCOUNTER — Other Ambulatory Visit: Payer: Self-pay

## 2015-02-24 DIAGNOSIS — E118 Type 2 diabetes mellitus with unspecified complications: Secondary | ICD-10-CM

## 2015-02-24 MED ORDER — METFORMIN HCL ER 750 MG PO TB24: 1500.0000 mg | ORAL_TABLET | Freq: Every day | ORAL | Status: DC

## 2015-02-24 MED ORDER — METFORMIN HCL ER 500 MG PO TB24: 500.0000 mg | ORAL_TABLET | Freq: Two times a day (BID) | ORAL | Status: DC

## 2015-02-24 MED ORDER — METFORMIN HCL ER 750 MG PO TB24: 750.0000 mg | ORAL_TABLET | Freq: Two times a day (BID) | ORAL | Status: DC

## 2015-02-24 NOTE — Assessment & Plan Note (Signed)
Her A1C is up to 8% Her insurance dose not cover her current metformin Will increase the dose and change at their request

## 2015-02-24 NOTE — Assessment & Plan Note (Signed)
Her BP is well controlled Lytes and renal function are stable 

## 2015-02-24 NOTE — Telephone Encounter (Signed)
A new prescription was sent Please inform her that the dose has been increased since her A1C was up to 8%

## 2015-02-24 NOTE — Telephone Encounter (Signed)
Patient states insurance will not cover metformin XR.  Insurance will not cover.  Pharmacy did send a fax yesterday.  Patient does not want to wait until PA can be processed because she has no medicine. She is requesting a script for something else to be sent [facility] to [facility] [address] and also to her mail order at [facility].

## 2015-02-24 NOTE — Telephone Encounter (Signed)
[facility] plan does not [facility] metformin, alternatives include Glucophage or generic Glucophage XR.

## 2015-02-24 NOTE — Addendum Note (Signed)
Addended by: Rock NephewARCHIE, Deneane Stifter T on: 02/24/2015 03:04 PM   Modules accepted: Medications

## 2015-02-24 NOTE — Telephone Encounter (Signed)
They were expecting the XR 500. But received 750. 750 is not on savings plan. Want to be sure 750 is correct. Can she use 3 of 500. Also is it 60 or 90 days. Please call back

## 2015-02-24 NOTE — Telephone Encounter (Signed)
FYI.Marland Kitchen.Marland Kitchen.Phone note already sent for MD to address ( see 02/23/14 9am), Closing duplicate

## 2015-02-24 NOTE — Assessment & Plan Note (Signed)
Her LDL is not well controlled Will start livalo

## 2015-02-24 NOTE — Telephone Encounter (Signed)
Pharmacy has been updated with a personal phone call.

## 2015-02-24 NOTE — Telephone Encounter (Signed)
LMOVM

## 2015-02-24 NOTE — Telephone Encounter (Signed)
[facility] from [facility] [employer] [facility] that [facility]. She also states that she is unsure why [facility] that [facility] will not cover metformin and have pt to pay 300$ for the suggested alternatives.  I advise [facility] that this request was only done as a result of information received [facility]. [facility] is now requesting a Rx for Metformin ER 500

## 2015-07-08 LAB — HM MAMMOGRAPHY: HM Mammogram: NORMAL

## 2015-07-09 ENCOUNTER — Encounter: Payer: Self-pay | Admitting: Family Medicine

## 2015-07-09 ENCOUNTER — Ambulatory Visit (INDEPENDENT_AMBULATORY_CARE_PROVIDER_SITE_OTHER): Payer: 59 | Admitting: Family Medicine

## 2015-07-09 ENCOUNTER — Encounter: Payer: Self-pay | Admitting: Internal Medicine

## 2015-07-09 VITALS — BP 128/80 | HR 68 | Temp 98.1°F | Wt 291.0 lb

## 2015-07-09 DIAGNOSIS — J0110 Acute frontal sinusitis, unspecified: Secondary | ICD-10-CM | POA: Diagnosis not present

## 2015-07-09 MED ORDER — AMOXICILLIN-POT CLAVULANATE 875-125 MG PO TABS: 1.0000 | ORAL_TABLET | Freq: Two times a day (BID) | ORAL | Status: DC

## 2015-07-09 NOTE — Progress Notes (Signed)
Pre visit review using our clinic review tool, if applicable. No additional management support is needed unless otherwise documented below in the visit note. 

## 2015-07-09 NOTE — Patient Instructions (Signed)

## 2015-07-09 NOTE — Progress Notes (Signed)
   Subjective:    Patient ID: Brianna Lambert, female    DOB: 12/14/1955, 59 y.o.   MRN: [id number]  HPI Acute visit.  Patient seen with three-week history of nasal congestion and left frontal sinus pressure. Intermittent headaches. Nasal congestion. Dry cough. Some postnasal drip. Increased fatigue. Denies any fevers or chills. She has tried over-the-counter expect current and nasal saline without improvement. No known drug allergies.  Past Medical History  Diagnosis Date  . Allergy   . Low back pain radiating to both legs 2012    Dr. Electa Sniff  . GERD (gastroesophageal reflux disease)   . PONV (postoperative nausea and vomiting)   . Asthma     environmental allergies  . Arthritis     knees and spine  . Lumbar pain with radiation down right leg   . Hiatal hernia    Past Surgical History  Procedure Laterality Date  . Cesarean section  1986; 1989  . Cholecystectomy  2003  . Back surgery  1992    lumbar lam  . Knee arthroscopy  2000    rt knee repair  . Posterior fusion lumbar spine  11/02/11    right PLIF; L2-L5  . Tonsillectomy  1970's  . Tubal ligation  1989    reports that she has never smoked. She has never used smokeless tobacco. She reports that she does not drink alcohol or use illicit drugs. family history includes Diabetes in her father; Heart disease in her father. There is no history of Cancer, Anesthesia problems, Hypotension, Malignant hyperthermia, or Pseudochol deficiency. Allergies  Allergen Reactions  . Crestor [Rosuvastatin] Other (See Comments)    Muscle aches      Review of Systems  Constitutional: Positive for fatigue. Negative for fever and chills.  HENT: Positive for congestion and sinus pressure. Negative for sore throat.   Respiratory: Positive for cough.   Neurological: Positive for headaches.       Objective:   Physical Exam  Constitutional: She appears well-developed and well-nourished.  HENT:  Right Ear: External ear normal.  Left  Ear: External ear normal.  Nose: Nose normal.  Mouth/Throat: Oropharynx is clear and moist.  Neck: Neck supple.  Cardiovascular: Normal rate and regular rhythm.   Pulmonary/Chest: Effort normal and breath sounds normal. No respiratory distress. She has no wheezes. She has no rales.  Lymphadenopathy:    She has no cervical adenopathy.          Assessment & Plan:  Probable acute left frontal sinusitis. Given duration of symptoms start Augmentin 875 mg twice daily for 10 days. Follow-up with primary physician if symptoms persist

## 2015-07-20 LAB — HM MAMMOGRAPHY: HM MAMMO: NORMAL

## 2015-08-18 ENCOUNTER — Other Ambulatory Visit: Payer: Self-pay | Admitting: Internal Medicine

## 2015-08-18 NOTE — Telephone Encounter (Signed)
Needs to be seen prior to any more refills.

## 2015-09-21 ENCOUNTER — Other Ambulatory Visit: Payer: Self-pay | Admitting: Internal Medicine

## 2015-09-22 ENCOUNTER — Telehealth: Payer: Self-pay | Admitting: *Deleted

## 2015-09-22 MED ORDER — METFORMIN HCL ER 500 MG PO TB24: ORAL_TABLET | ORAL | Status: DC

## 2015-09-22 NOTE — Telephone Encounter (Signed)
Left smg on triage stating md denied refill on her metformin. She has schedule f/u appt for 10/04/15. Wanting to get enough until she come in on 11/14. Took last pill this am. Called pt back no answer LMOM sent 1 refill  Until appt...Raechel Chute/lmb

## 2015-10-04 ENCOUNTER — Other Ambulatory Visit (INDEPENDENT_AMBULATORY_CARE_PROVIDER_SITE_OTHER): Payer: 59

## 2015-10-04 ENCOUNTER — Other Ambulatory Visit (HOSPITAL_COMMUNITY)
Admission: RE | Admit: 2015-10-04 | Discharge: 2015-10-04 | Disposition: A | Discharge status: Home / Self Care | Payer: 59 | Source: Ambulatory Visit | Attending: Internal Medicine | Admitting: Internal Medicine

## 2015-10-04 ENCOUNTER — Ambulatory Visit (INDEPENDENT_AMBULATORY_CARE_PROVIDER_SITE_OTHER): Payer: 59 | Admitting: Internal Medicine

## 2015-10-04 ENCOUNTER — Encounter: Payer: Self-pay | Admitting: Internal Medicine

## 2015-10-04 VITALS — BP 122/80 | HR 70 | Temp 98.0°F | Resp 16 | Ht 68.0 in | Wt 289.0 lb

## 2015-10-04 DIAGNOSIS — Z0000 Encounter for general adult medical examination without abnormal findings: Secondary | ICD-10-CM | POA: Diagnosis not present

## 2015-10-04 DIAGNOSIS — Z01419 Encounter for gynecological examination (general) (routine) without abnormal findings: Secondary | ICD-10-CM | POA: Insufficient documentation

## 2015-10-04 DIAGNOSIS — Z1211 Encounter for screening for malignant neoplasm of colon: Secondary | ICD-10-CM | POA: Insufficient documentation

## 2015-10-04 DIAGNOSIS — Z1159 Encounter for screening for other viral diseases: Secondary | ICD-10-CM

## 2015-10-04 DIAGNOSIS — E118 Type 2 diabetes mellitus with unspecified complications: Secondary | ICD-10-CM | POA: Diagnosis not present

## 2015-10-04 DIAGNOSIS — Z23 Encounter for immunization: Secondary | ICD-10-CM | POA: Diagnosis not present

## 2015-10-04 DIAGNOSIS — Z1231 Encounter for screening mammogram for malignant neoplasm of breast: Secondary | ICD-10-CM | POA: Insufficient documentation

## 2015-10-04 DIAGNOSIS — E785 Hyperlipidemia, unspecified: Secondary | ICD-10-CM | POA: Diagnosis not present

## 2015-10-04 LAB — CBC WITH DIFFERENTIAL/PLATELET
BASOS PCT: 0.6 % (ref 0.0–3.0)
Basophils Absolute: 0.1 10*3/uL (ref 0.0–0.1)
EOS PCT: 2.1 % (ref 0.0–5.0)
Eosinophils Absolute: 0.2 10*3/uL (ref 0.0–0.7)
HCT: 43.8 % (ref 36.0–46.0)
Hemoglobin: 14.5 g/dL (ref 12.0–15.0)
LYMPHS ABS: 3 10*3/uL (ref 0.7–4.0)
Lymphocytes Relative: 35.1 % (ref 12.0–46.0)
MCHC: 33.2 g/dL (ref 30.0–36.0)
MCV: 89.1 fl (ref 78.0–100.0)
MONO ABS: 0.7 10*3/uL (ref 0.1–1.0)
Monocytes Relative: 8.3 % (ref 3.0–12.0)
NEUTROS PCT: 53.9 % (ref 43.0–77.0)
Neutro Abs: 4.5 10*3/uL (ref 1.4–7.7)
Platelets: 293 10*3/uL (ref 150.0–400.0)
RBC: 4.91 Mil/uL (ref 3.87–5.11)
RDW: 13.3 % (ref 11.5–15.5)
WBC: 8.4 10*3/uL (ref 4.0–10.5)

## 2015-10-04 LAB — URINALYSIS, ROUTINE W REFLEX MICROSCOPIC
Bilirubin Urine: NEGATIVE
Ketones, ur: NEGATIVE
Leukocytes, UA: NEGATIVE
NITRITE: NEGATIVE
Specific Gravity, Urine: 1.025 (ref 1.000–1.030)
Total Protein, Urine: NEGATIVE
Urine Glucose: 250 — AB
Urobilinogen, UA: 0.2 (ref 0.0–1.0)
pH: 6 (ref 5.0–8.0)

## 2015-10-04 LAB — COMPREHENSIVE METABOLIC PANEL
ALK PHOS: 52 U/L (ref 39–117)
ALT: 39 U/L — AB (ref 0–35)
AST: 28 U/L (ref 0–37)
Albumin: 3.7 g/dL (ref 3.5–5.2)
BILIRUBIN TOTAL: 0.4 mg/dL (ref 0.2–1.2)
BUN: 16 mg/dL (ref 6–23)
CALCIUM: 9 mg/dL (ref 8.4–10.5)
CO2: 26 meq/L (ref 19–32)
Chloride: 103 mEq/L (ref 96–112)
Creatinine, Ser: 0.77 mg/dL (ref 0.40–1.20)
GFR: 81.2 mL/min (ref >60.00)
Glucose, Bld: 157 mg/dL — ABNORMAL HIGH (ref 70–99)
POTASSIUM: 3.9 meq/L (ref 3.5–5.1)
Sodium: 137 mEq/L (ref 135–145)
TOTAL PROTEIN: 6.7 g/dL (ref 6.0–8.3)

## 2015-10-04 LAB — LIPID PANEL
CHOL/HDL RATIO: 5
Cholesterol: 217 mg/dL — ABNORMAL HIGH (ref 0–200)
HDL: 42 mg/dL (ref >39.00)
LDL Cholesterol: 148 mg/dL — ABNORMAL HIGH (ref 0–99)
NonHDL: 174.85
TRIGLYCERIDES: 136 mg/dL (ref 0.0–149.0)
VLDL: 27.2 mg/dL (ref 0.0–40.0)

## 2015-10-04 LAB — MICROALBUMIN / CREATININE URINE RATIO
Creatinine,U: 147.7 mg/dL
Microalb Creat Ratio: 0.5 mg/g (ref 0.0–30.0)

## 2015-10-04 LAB — HEMOGLOBIN A1C: HEMOGLOBIN A1C: 8 % — AB (ref 4.6–6.5)

## 2015-10-04 LAB — TSH: TSH: 0.76 u[IU]/mL (ref 0.35–4.50)

## 2015-10-04 NOTE — Progress Notes (Signed)
Pre visit review using our clinic review tool, if applicable. No additional management support is needed unless otherwise documented below in the visit note. 

## 2015-10-04 NOTE — Patient Instructions (Signed)
Preventive Care for Adults, Female A healthy lifestyle and preventive care can promote health and wellness. Preventive health guidelines for women include the following key practices.  A routine yearly physical is a good way to check with your health care provider about your health and preventive screening. It is a chance to share any concerns and updates on your health and to receive a thorough exam.  Visit your dentist for a routine exam and preventive care every 6 months. Brush your teeth twice a day and floss once a day. Good oral hygiene prevents tooth decay and gum disease.  The frequency of eye exams is based on your age, health, family medical history, use of contact lenses, and other factors. Follow your health care provider's recommendations for frequency of eye exams.  Eat a healthy diet. Foods like vegetables, fruits, whole grains, low-fat dairy products, and lean protein foods contain the nutrients you need without too many calories. Decrease your intake of foods high in solid fats, added sugars, and salt. Eat the right amount of calories for you.Get information about a proper diet from your health care provider, if necessary.  Regular physical exercise is one of the most important things you can do for your health. Most adults should get at least 150 minutes of moderate-intensity exercise (any activity that increases your heart rate and causes you to sweat) each week. In addition, most adults need muscle-strengthening exercises on 2 or more days a week.  Maintain a healthy weight. The body mass index (BMI) is a screening tool to identify possible weight problems. It provides an estimate of body fat based on height and weight. Your health care provider can find your BMI and can help you achieve or maintain a healthy weight.For adults 20 years and older:  A BMI below 18.5 is considered underweight.  A BMI of 18.5 to 24.9 is normal.  A BMI of 25 to 29.9 is considered overweight.  A  BMI of 30 and above is considered obese.  Maintain normal blood lipids and cholesterol levels by exercising and minimizing your intake of saturated fat. Eat a balanced diet with plenty of fruit and vegetables. Blood tests for lipids and cholesterol should begin at age 45 and be repeated every 5 years. If your lipid or cholesterol levels are high, you are over 50, or you are at high risk for heart disease, you may need your cholesterol levels checked more frequently.Ongoing high lipid and cholesterol levels should be treated with medicines if diet and exercise are not working.  If you smoke, find out from your health care provider how to quit. If you do not use tobacco, do not start.  Lung cancer screening is recommended for adults aged 45-80 years who are at high risk for developing lung cancer because of a history of smoking. A yearly low-dose CT scan of the lungs is recommended for people who have at least a 30-pack-year history of smoking and are a current smoker or have quit within the past 15 years. A pack year of smoking is smoking an average of 1 pack of cigarettes a day for 1 year (for example: 1 pack a day for 30 years or 2 packs a day for 15 years). Yearly screening should continue until the smoker has stopped smoking for at least 15 years. Yearly screening should be stopped for people who develop a health problem that would prevent them from having lung cancer treatment.  If you are pregnant, do not drink alcohol. If you are  breastfeeding, be very cautious about drinking alcohol. If you are not pregnant and choose to drink alcohol, do not have more than 1 drink per day. One drink is considered to be 12 ounces (355 mL) of beer, 5 ounces (148 mL) of wine, or 1.5 ounces (44 mL) of liquor.  Avoid use of street drugs. Do not share needles with anyone. Ask for help if you need support or instructions about stopping the use of drugs.  High blood pressure causes heart disease and increases the risk  of stroke. Your blood pressure should be checked at least every 1 to 2 years. Ongoing high blood pressure should be treated with medicines if weight loss and exercise do not work.  If you are 55-79 years old, ask your health care provider if you should take aspirin to prevent strokes.  Diabetes screening is done by taking a blood sample to check your blood glucose level after you have not eaten for a certain period of time (fasting). If you are not overweight and you do not have risk factors for diabetes, you should be screened once every 3 years starting at age 45. If you are overweight or obese and you are 40-70 years of age, you should be screened for diabetes every year as part of your cardiovascular risk assessment.  Breast cancer screening is essential preventive care for women. You should practice "breast self-awareness." This means understanding the normal appearance and feel of your breasts and may include breast self-examination. Any changes detected, no matter how small, should be reported to a health care provider. Women in their 20s and 30s should have a clinical breast exam (CBE) by a health care provider as part of a regular health exam every 1 to 3 years. After age 40, women should have a CBE every year. Starting at age 40, women should consider having a mammogram (breast X-ray test) every year. Women who have a family history of breast cancer should talk to their health care provider about genetic screening. Women at a high risk of breast cancer should talk to their health care providers about having an MRI and a mammogram every year.  Breast cancer gene (BRCA)-related cancer risk assessment is recommended for women who have family members with BRCA-related cancers. BRCA-related cancers include breast, ovarian, tubal, and peritoneal cancers. Having family members with these cancers may be associated with an increased risk for harmful changes (mutations) in the breast cancer genes BRCA1 and  BRCA2. Results of the assessment will determine the need for genetic counseling and BRCA1 and BRCA2 testing.  Your health care provider may recommend that you be screened regularly for cancer of the pelvic organs (ovaries, uterus, and vagina). This screening involves a pelvic examination, including checking for microscopic changes to the surface of your cervix (Pap test). You may be encouraged to have this screening done every 3 years, beginning at age 21.  For women ages 30-65, health care providers may recommend pelvic exams and Pap testing every 3 years, or they may recommend the Pap and pelvic exam, combined with testing for human papilloma virus (HPV), every 5 years. Some types of HPV increase your risk of cervical cancer. Testing for HPV may also be done on women of any age with unclear Pap test results.  Other health care providers may not recommend any screening for nonpregnant women who are considered low risk for pelvic cancer and who do not have symptoms. Ask your health care provider if a screening pelvic exam is right for   you.  If you have had past treatment for cervical cancer or a condition that could lead to cancer, you need Pap tests and screening for cancer for at least 20 years after your treatment. If Pap tests have been discontinued, your risk factors (such as having a new sexual partner) need to be reassessed to determine if screening should resume. Some women have medical problems that increase the chance of getting cervical cancer. In these cases, your health care provider may recommend more frequent screening and Pap tests.  Colorectal cancer can be detected and often prevented. Most routine colorectal cancer screening begins at the age of 50 years and continues through age 75 years. However, your health care provider may recommend screening at an earlier age if you have risk factors for colon cancer. On a yearly basis, your health care provider may provide home test kits to check  for hidden blood in the stool. Use of a small camera at the end of a tube, to directly examine the colon (sigmoidoscopy or colonoscopy), can detect the earliest forms of colorectal cancer. Talk to your health care provider about this at age 50, when routine screening begins. Direct exam of the colon should be repeated every 5-10 years through age 75 years, unless early forms of precancerous polyps or small growths are found.  People who are at an increased risk for hepatitis B should be screened for this virus. You are considered at high risk for hepatitis B if:  You were born in a country where hepatitis B occurs often. Talk with your health care provider about which countries are considered high risk.  Your parents were born in a high-risk country and you have not received a shot to protect against hepatitis B (hepatitis B vaccine).  You have HIV or AIDS.  You use needles to inject street drugs.  You live with, or have sex with, someone who has hepatitis B.  You get hemodialysis treatment.  You take certain medicines for conditions like cancer, organ transplantation, and autoimmune conditions.  Hepatitis C blood testing is recommended for all people born from 1945 through 1965 and any individual with known risks for hepatitis C.  Practice safe sex. Use condoms and avoid high-risk sexual practices to reduce the spread of sexually transmitted infections (STIs). STIs include gonorrhea, chlamydia, syphilis, trichomonas, herpes, HPV, and human immunodeficiency virus (HIV). Herpes, HIV, and HPV are viral illnesses that have no cure. They can result in disability, cancer, and death.  You should be screened for sexually transmitted illnesses (STIs) including gonorrhea and chlamydia if:  You are sexually active and are younger than 24 years.  You are older than 24 years and your health care provider tells you that you are at risk for this type of infection.  Your sexual activity has changed  since you were last screened and you are at an increased risk for chlamydia or gonorrhea. Ask your health care provider if you are at risk.  If you are at risk of being infected with HIV, it is recommended that you take a prescription medicine daily to prevent HIV infection. This is called preexposure prophylaxis (PrEP). You are considered at risk if:  You are sexually active and do not regularly use condoms or know the HIV status of your partner(s).  You take drugs by injection.  You are sexually active with a partner who has HIV.  Talk with your health care provider about whether you are at high risk of being infected with HIV. If   you choose to begin PrEP, you should first be tested for HIV. You should then be tested every 3 months for as long as you are taking PrEP.  Osteoporosis is a disease in which the bones lose minerals and strength with aging. This can result in serious bone fractures or breaks. The risk of osteoporosis can be identified using a bone density scan. Women ages 67 years and over and women at risk for fractures or osteoporosis should discuss screening with their health care providers. Ask your health care provider whether you should take a calcium supplement or vitamin D to reduce the rate of osteoporosis.  Menopause can be associated with physical symptoms and risks. Hormone replacement therapy is available to decrease symptoms and risks. You should talk to your health care provider about whether hormone replacement therapy is right for you.  Use sunscreen. Apply sunscreen liberally and repeatedly throughout the day. You should seek shade when your shadow is shorter than you. Protect yourself by wearing long sleeves, pants, a wide-brimmed hat, and sunglasses year round, whenever you are outdoors.  Once a month, do a whole body skin exam, using a mirror to look at the skin on your back. Tell your health care provider of new moles, moles that have irregular borders, moles that  are larger than a pencil eraser, or moles that have changed in shape or color.  Stay current with required vaccines (immunizations).  Influenza vaccine. All adults should be immunized every year.  Tetanus, diphtheria, and acellular pertussis (Td, Tdap) vaccine. Pregnant women should receive 1 dose of Tdap vaccine during each pregnancy. The dose should be obtained regardless of the length of time since the last dose. Immunization is preferred during the 27th-36th week of gestation. An adult who has not previously received Tdap or who does not know her vaccine status should receive 1 dose of Tdap. This initial dose should be followed by tetanus and diphtheria toxoids (Td) booster doses every 10 years. Adults with an unknown or incomplete history of completing a 3-dose immunization series with Td-containing vaccines should begin or complete a primary immunization series including a Tdap dose. Adults should receive a Td booster every 10 years.  Varicella vaccine. An adult without evidence of immunity to varicella should receive 2 doses or a second dose if she has previously received 1 dose. Pregnant females who do not have evidence of immunity should receive the first dose after pregnancy. This first dose should be obtained before leaving the health care facility. The second dose should be obtained 4-8 weeks after the first dose.  Human papillomavirus (HPV) vaccine. Females aged 13-26 years who have not received the vaccine previously should obtain the 3-dose series. The vaccine is not recommended for use in pregnant females. However, pregnancy testing is not needed before receiving a dose. If a female is found to be pregnant after receiving a dose, no treatment is needed. In that case, the remaining doses should be delayed until after the pregnancy. Immunization is recommended for any person with an immunocompromised condition through the age of 61 years if she did not get any or all doses earlier. During the  3-dose series, the second dose should be obtained 4-8 weeks after the first dose. The third dose should be obtained 24 weeks after the first dose and 16 weeks after the second dose.  Zoster vaccine. One dose is recommended for adults aged 30 years or older unless certain conditions are present.  Measles, mumps, and rubella (MMR) vaccine. Adults born  before 1957 generally are considered immune to measles and mumps. Adults born in 1957 or later should have 1 or more doses of MMR vaccine unless there is a contraindication to the vaccine or there is laboratory evidence of immunity to each of the three diseases. A routine second dose of MMR vaccine should be obtained at least 28 days after the first dose for students attending postsecondary schools, health care workers, or international travelers. People who received inactivated measles vaccine or an unknown type of measles vaccine during 1963-1967 should receive 2 doses of MMR vaccine. People who received inactivated mumps vaccine or an unknown type of mumps vaccine before 1979 and are at high risk for mumps infection should consider immunization with 2 doses of MMR vaccine. For females of childbearing age, rubella immunity should be determined. If there is no evidence of immunity, females who are not pregnant should be vaccinated. If there is no evidence of immunity, females who are pregnant should delay immunization until after pregnancy. Unvaccinated health care workers born before 1957 who lack laboratory evidence of measles, mumps, or rubella immunity or laboratory confirmation of disease should consider measles and mumps immunization with 2 doses of MMR vaccine or rubella immunization with 1 dose of MMR vaccine.  Pneumococcal 13-valent conjugate (PCV13) vaccine. When indicated, a person who is uncertain of his immunization history and has no record of immunization should receive the PCV13 vaccine. All adults 65 years of age and older should receive this  vaccine. An adult aged 19 years or older who has certain medical conditions and has not been previously immunized should receive 1 dose of PCV13 vaccine. This PCV13 should be followed with a dose of pneumococcal polysaccharide (PPSV23) vaccine. Adults who are at high risk for pneumococcal disease should obtain the PPSV23 vaccine at least 8 weeks after the dose of PCV13 vaccine. Adults older than 60 years of age who have normal immune system function should obtain the PPSV23 vaccine dose at least 1 year after the dose of PCV13 vaccine.  Pneumococcal polysaccharide (PPSV23) vaccine. When PCV13 is also indicated, PCV13 should be obtained first. All adults aged 65 years and older should be immunized. An adult younger than age 65 years who has certain medical conditions should be immunized. Any person who resides in a nursing home or long-term care facility should be immunized. An adult smoker should be immunized. People with an immunocompromised condition and certain other conditions should receive both PCV13 and PPSV23 vaccines. People with human immunodeficiency virus (HIV) infection should be immunized as soon as possible after diagnosis. Immunization during chemotherapy or radiation therapy should be avoided. Routine use of PPSV23 vaccine is not recommended for American Indians, [place] Natives, or people younger than 65 years unless there are medical conditions that require PPSV23 vaccine. When indicated, people who have unknown immunization and have no record of immunization should receive PPSV23 vaccine. One-time revaccination 5 years after the first dose of PPSV23 is recommended for people aged 19-64 years who have chronic kidney failure, nephrotic syndrome, asplenia, or immunocompromised conditions. People who received 1-2 doses of PPSV23 before age 65 years should receive another dose of PPSV23 vaccine at age 65 years or later if at least 5 years have passed since the previous dose. Doses of PPSV23 are not  needed for people immunized with PPSV23 at or after age 65 years.  Meningococcal vaccine. Adults with asplenia or persistent complement component deficiencies should receive 2 doses of quadrivalent meningococcal conjugate (MenACWY-D) vaccine. The doses should be obtained   at least 2 months apart. Microbiologists working with certain meningococcal bacteria, [place] recruits, people at risk during an outbreak, and people who travel to or live in countries with a high rate of meningitis should be immunized. A first-year college student up through age 34 years who is living in a residence hall should receive a dose if she did not receive a dose on or after her 16th birthday. Adults who have certain high-risk conditions should receive one or more doses of vaccine.  Hepatitis A vaccine. Adults who wish to be protected from this disease, have certain high-risk conditions, work with hepatitis A-infected animals, work in hepatitis A research labs, or travel to or work in countries with a high rate of hepatitis A should be immunized. Adults who were previously unvaccinated and who anticipate close contact with an international adoptee during the first 60 days after arrival in the [place] from a country with a high rate of hepatitis A should be immunized.  Hepatitis B vaccine. Adults who wish to be protected from this disease, have certain high-risk conditions, may be exposed to blood or other infectious body fluids, are household contacts or sex partners of hepatitis B positive people, are clients or workers in certain care facilities, or travel to or work in countries with a high rate of hepatitis B should be immunized.  Haemophilus influenzae type b (Hib) vaccine. A previously unvaccinated person with asplenia or sickle cell disease or having a scheduled splenectomy should receive 1 dose of Hib vaccine. Regardless of previous immunization, a recipient of a hematopoietic stem cell transplant should receive a  3-dose series 6-12 months after her successful transplant. Hib vaccine is not recommended for adults with HIV infection. Preventive Services / Frequency Ages 35 to 4 years  Blood pressure check.** / Every 3-5 years.  Lipid and cholesterol check.** / Every 5 years beginning at age 60.  Clinical breast exam.** / Every 3 years for women in their 71s and 10s.  BRCA-related cancer risk assessment.** / For women who have family members with a BRCA-related cancer (breast, ovarian, tubal, or peritoneal cancers).  Pap test.** / Every 2 years from ages 76 through 26. Every 3 years starting at age 61 through age 76 or 93 with a history of 3 consecutive normal Pap tests.  HPV screening.** / Every 3 years from ages 37 through ages 60 to 51 with a history of 3 consecutive normal Pap tests.  Hepatitis C blood test.** / For any individual with known risks for hepatitis C.  Skin self-exam. / Monthly.  Influenza vaccine. / Every year.  Tetanus, diphtheria, and acellular pertussis (Tdap, Td) vaccine.** / Consult your health care provider. Pregnant women should receive 1 dose of Tdap vaccine during each pregnancy. 1 dose of Td every 10 years.  Varicella vaccine.** / Consult your health care provider. Pregnant females who do not have evidence of immunity should receive the first dose after pregnancy.  HPV vaccine. / 3 doses over 6 months, if 93 and younger. The vaccine is not recommended for use in pregnant females. However, pregnancy testing is not needed before receiving a dose.  Measles, mumps, rubella (MMR) vaccine.** / You need at least 1 dose of MMR if you were born in 1957 or later. You may also need a 2nd dose. For females of childbearing age, rubella immunity should be determined. If there is no evidence of immunity, females who are not pregnant should be vaccinated. If there is no evidence of immunity, females who are  pregnant should delay immunization until after pregnancy.  Pneumococcal  13-valent conjugate (PCV13) vaccine.** / Consult your health care provider.  Pneumococcal polysaccharide (PPSV23) vaccine.** / 1 to 2 doses if you smoke cigarettes or if you have certain conditions.  Meningococcal vaccine.** / 1 dose if you are age 68 to 8 years and a Market researcher living in a residence hall, or have one of several medical conditions, you need to get vaccinated against meningococcal disease. You may also need additional booster doses.  Hepatitis A vaccine.** / Consult your health care provider.  Hepatitis B vaccine.** / Consult your health care provider.  Haemophilus influenzae type b (Hib) vaccine.** / Consult your health care provider. Ages 7 to 53 years  Blood pressure check.** / Every year.  Lipid and cholesterol check.** / Every 5 years beginning at age 25 years.  Lung cancer screening. / Every year if you are aged 11-80 years and have a 30-pack-year history of smoking and currently smoke or have quit within the past 15 years. Yearly screening is stopped once you have quit smoking for at least 15 years or develop a health problem that would prevent you from having lung cancer treatment.  Clinical breast exam.** / Every year after age 48 years.  BRCA-related cancer risk assessment.** / For women who have family members with a BRCA-related cancer (breast, ovarian, tubal, or peritoneal cancers).  Mammogram.** / Every year beginning at age 41 years and continuing for as long as you are in good health. Consult with your health care provider.  Pap test.** / Every 3 years starting at age 65 years through age 37 or 70 years with a history of 3 consecutive normal Pap tests.  HPV screening.** / Every 3 years from ages 72 years through ages 60 to 40 years with a history of 3 consecutive normal Pap tests.  Fecal occult blood test (FOBT) of stool. / Every year beginning at age 21 years and continuing until age 5 years. You may not need to do this test if you get  a colonoscopy every 10 years.  Flexible sigmoidoscopy or colonoscopy.** / Every 5 years for a flexible sigmoidoscopy or every 10 years for a colonoscopy beginning at age 35 years and continuing until age 48 years.  Hepatitis C blood test.** / For all people born from [birth year] through 1965 and any individual with known risks for hepatitis C.  Skin self-exam. / Monthly.  Influenza vaccine. / Every year.  Tetanus, diphtheria, and acellular pertussis (Tdap/Td) vaccine.** / Consult your health care provider. Pregnant women should receive 1 dose of Tdap vaccine during each pregnancy. 1 dose of Td every 10 years.  Varicella vaccine.** / Consult your health care provider. Pregnant females who do not have evidence of immunity should receive the first dose after pregnancy.  Zoster vaccine.** / 1 dose for adults aged 30 years or older.  Measles, mumps, rubella (MMR) vaccine.** / You need at least 1 dose of MMR if you were born in 1957 or later. You may also need a second dose. For females of childbearing age, rubella immunity should be determined. If there is no evidence of immunity, females who are not pregnant should be vaccinated. If there is no evidence of immunity, females who are pregnant should delay immunization until after pregnancy.  Pneumococcal 13-valent conjugate (PCV13) vaccine.** / Consult your health care provider.  Pneumococcal polysaccharide (PPSV23) vaccine.** / 1 to 2 doses if you smoke cigarettes or if you have certain conditions.  Meningococcal vaccine.** /  Consult your health care provider.  Hepatitis A vaccine.** / Consult your health care provider.  Hepatitis B vaccine.** / Consult your health care provider.  Haemophilus influenzae type b (Hib) vaccine.** / Consult your health care provider. Ages 64 years and over  Blood pressure check.** / Every year.  Lipid and cholesterol check.** / Every 5 years beginning at age 23 years.  Lung cancer screening. / Every year if you  are aged 16-80 years and have a 30-pack-year history of smoking and currently smoke or have quit within the past 15 years. Yearly screening is stopped once you have quit smoking for at least 15 years or develop a health problem that would prevent you from having lung cancer treatment.  Clinical breast exam.** / Every year after age 74 years.  BRCA-related cancer risk assessment.** / For women who have family members with a BRCA-related cancer (breast, ovarian, tubal, or peritoneal cancers).  Mammogram.** / Every year beginning at age 44 years and continuing for as long as you are in good health. Consult with your health care provider.  Pap test.** / Every 3 years starting at age 58 years through age 22 or 39 years with 3 consecutive normal Pap tests. Testing can be stopped between 65 and 70 years with 3 consecutive normal Pap tests and no abnormal Pap or HPV tests in the past 10 years.  HPV screening.** / Every 3 years from ages 64 years through ages 70 or 61 years with a history of 3 consecutive normal Pap tests. Testing can be stopped between 65 and 70 years with 3 consecutive normal Pap tests and no abnormal Pap or HPV tests in the past 10 years.  Fecal occult blood test (FOBT) of stool. / Every year beginning at age 40 years and continuing until age 27 years. You may not need to do this test if you get a colonoscopy every 10 years.  Flexible sigmoidoscopy or colonoscopy.** / Every 5 years for a flexible sigmoidoscopy or every 10 years for a colonoscopy beginning at age 7 years and continuing until age 32 years.  Hepatitis C blood test.** / For all people born from [birth year] through 1965 and any individual with known risks for hepatitis C.  Osteoporosis screening.** / A one-time screening for women ages 30 years and over and women at risk for fractures or osteoporosis.  Skin self-exam. / Monthly.  Influenza vaccine. / Every year.  Tetanus, diphtheria, and acellular pertussis (Tdap/Td)  vaccine.** / 1 dose of Td every 10 years.  Varicella vaccine.** / Consult your health care provider.  Zoster vaccine.** / 1 dose for adults aged 35 years or older.  Pneumococcal 13-valent conjugate (PCV13) vaccine.** / Consult your health care provider.  Pneumococcal polysaccharide (PPSV23) vaccine.** / 1 dose for all adults aged 46 years and older.  Meningococcal vaccine.** / Consult your health care provider.  Hepatitis A vaccine.** / Consult your health care provider.  Hepatitis B vaccine.** / Consult your health care provider.  Haemophilus influenzae type b (Hib) vaccine.** / Consult your health care provider. ** Family history and personal history of risk and conditions may change your health care provider's recommendations.   This information is not intended to replace advice given to you by your health care provider. Make sure you discuss any questions you have with your health care provider.   Document Released: 01/02/2002 Document Revised: 11/27/2014 Document Reviewed: 04/03/2011 Elsevier Interactive Patient Education [employer].

## 2015-10-04 NOTE — Progress Notes (Signed)
Subjective:  Patient ID: Brianna Lambert, female    DOB: 1956-10-28  Age: 59 y.o. MRN: [id number]  CC: Diabetes; Annual Exam; and Hyperlipidemia   HPI Brianna Lambert presents for a CPX and f/up on DM2 and High LDL - she feels well and offers no complaints.  Outpatient Prescriptions Prior to Visit  Medication Sig Dispense Refill  . bromocriptine (PARLODEL) 2.5 MG tablet TAKE 1 TABLET BY MOUTH EVERY NIGHT AT BEDTIME. NEEDS APPOITNMENT FOR FURTHER FILLS 30 tablet 0  . cetirizine (ZYRTEC) 10 MG tablet Take 10 mg by mouth daily.      Marland Kitchen levalbuterol (XOPENEX HFA) 45 MCG/ACT inhaler Inhale 1-2 puffs into the lungs every 4 (four) hours as needed. For shortness of breath     . metFORMIN (GLUCOPHAGE XR) 500 MG 24 hr tablet Take 1 tablet (500 mg total) by mouth 2 (two) times daily. 180 tablet 1  . montelukast (SINGULAIR) 10 MG tablet Take 1 tablet by mouth at  bedtime 90 tablet 3  . olmesartan (BENICAR) 40 MG tablet Take 1 tablet by mouth  daily 90 tablet 3  . Omeprazole-Sodium Bicarbonate (ZEGERID OTC) 20-1100 MG CAPS capsule Take 1 capsule by mouth 2 (two) times daily. 180 each 3  . Pitavastatin Calcium (LIVALO) 2 MG TABS Take 1 tablet (2 mg total) by mouth daily. 90 tablet 3  . amoxicillin-clavulanate (AUGMENTIN) 875-125 MG per tablet Take 1 tablet by mouth 2 (two) times daily. 20 tablet 0  . metFORMIN (GLUCOPHAGE-XR) 500 MG 24 hr tablet TAKE ONE TABLET ( ) BY MOUTH TWICE A DAY. 60 tablet 0   No facility-administered medications prior to visit.    ROS Review of Systems  Constitutional: Negative.  Negative for fever, chills, diaphoresis, appetite change and fatigue.  HENT: Negative.   Eyes: Negative.   Respiratory: Negative.  Negative for cough, choking, chest tightness, shortness of breath and stridor.   Cardiovascular: Negative.  Negative for chest pain, palpitations and leg swelling.  Gastrointestinal: Negative.  Negative for nausea, vomiting, abdominal pain, diarrhea, constipation  and blood in stool.  Endocrine: Negative.  Negative for polydipsia, polyphagia and polyuria.  Genitourinary: Negative.  Negative for dysuria, urgency, hematuria, flank pain, decreased urine volume and difficulty urinating.  Musculoskeletal: Negative.  Negative for myalgias, back pain, joint swelling and arthralgias.  Skin: Negative.  Negative for color change and rash.  Allergic/Immunologic: Negative.   Neurological: Negative.  Negative for dizziness.  Hematological: Negative.  Negative for adenopathy. Does not bruise/bleed easily.  Psychiatric/Behavioral: Negative.     Objective:  BP 122/80 mmHg  Pulse 70  Temp(Src) 98 F (36.7 C) (Oral)  Resp 16  Ht  (1.727 m)  Wt 289 lb (131.09 kg)  BMI 43.95 kg/m2  SpO2 98%  BP Readings from Last 3 Encounters:  10/04/15 122/80  07/09/15 128/80  02/23/15 128/80    Wt Readings from Last 3 Encounters:  10/04/15 289 lb (131.09 kg)  07/09/15 291 lb (131.997 kg)  02/23/15 288 lb (130.636 kg)    Physical Exam  Constitutional: She is oriented to person, place, and time. She appears well-developed and well-nourished. No distress.  HENT:  Head: Normocephalic and atraumatic.  Mouth/Throat: Oropharynx is clear and moist. No oropharyngeal exudate.  Eyes: Conjunctivae are normal. Right eye exhibits no discharge. Left eye exhibits no discharge. No scleral icterus.  Neck: Normal range of motion. Neck supple. No JVD present. No tracheal deviation present. No thyromegaly present.  Cardiovascular: Normal rate, regular rhythm, normal heart sounds and intact  distal pulses.  Exam reveals no gallop and no friction rub.   No murmur heard. Pulmonary/Chest: Effort normal and breath sounds normal. No stridor. No respiratory distress. She has no wheezes. She has no rales. She exhibits no tenderness.  Abdominal: Soft. Bowel sounds are normal. She exhibits no distension and no mass. There is no tenderness. There is no rebound and no guarding. Hernia confirmed  negative in the right inguinal area and confirmed negative in the left inguinal area.  Genitourinary: Rectum normal and vagina normal. Rectal exam shows no external hemorrhoid, no internal hemorrhoid, no fissure, no mass, no tenderness and anal tone normal. Guaiac negative stool. No breast swelling, tenderness, discharge or bleeding. Pelvic exam was performed with patient supine. No labial fusion. There is no rash, tenderness, lesion or injury on the right labia. There is no rash, tenderness, lesion or injury on the left labia. Uterus is not deviated, not enlarged, not fixed and not tender. Cervix exhibits no motion tenderness, no discharge and no friability. Right adnexum displays no mass, no tenderness and no fullness. Left adnexum displays no mass, no tenderness and no fullness. No erythema, tenderness or bleeding in the vagina. No foreign body around the vagina. No signs of injury around the vagina. No vaginal discharge found.  Musculoskeletal: Normal range of motion. She exhibits no edema or tenderness.  Lymphadenopathy:    She has no cervical adenopathy.       Right: No inguinal adenopathy present.       Left: No inguinal adenopathy present.  Neurological: She is oriented to person, place, and time.  Skin: Skin is warm and dry. No rash noted. She is not diaphoretic. No erythema. No pallor.  Psychiatric: She has a normal mood and affect. Her behavior is normal. Judgment and thought content normal.  Vitals reviewed.   Lab Results  Component Value Date   WBC 8.4 10/04/2015   HGB 14.5 10/04/2015   HCT 43.8 10/04/2015   PLT 293.0 10/04/2015   GLUCOSE 157* 10/04/2015   CHOL 217* 10/04/2015   TRIG 136.0 10/04/2015   HDL 42.00 10/04/2015   LDLDIRECT 155.6 07/11/2012   LDLCALC 148* 10/04/2015   ALT 39* 10/04/2015   AST 28 10/04/2015   NA 137 10/04/2015   K 3.9 10/04/2015   CL 103 10/04/2015   CREATININE 0.77 10/04/2015   BUN 16 10/04/2015   CO2 26 10/04/2015   TSH 0.76 10/04/2015   INR  0.97 10/27/2011   HGBA1C 8.0* 10/04/2015   MICROALBUR >0.7 10/04/2015    Dg Chest 2 View  08/18/2014  CLINICAL DATA:  Chest pain EXAM: CHEST  2 VIEW COMPARISON:  October 27, 2011 FINDINGS: Lungs are clear. The heart size and pulmonary vascularity are normal. No adenopathy. No pneumothorax. There is mild degenerative change in the thoracic spine. IMPRESSION: No edema or consolidation. Electronically Signed   By: Bretta BangWilliam  Woodruff M.D.   On: 08/18/2014 13:39    Assessment & Plan:   Brianna Lambert was seen today for diabetes, annual exam and hyperlipidemia.  Diagnoses and all orders for this visit:  Type 2 diabetes mellitus with complication, without long-term current use of insulin (HCC)- her blood sugars are not well controlled, will continue metformin and add trulicity -     Microalbumin / creatinine urine ratio; Future -     Hemoglobin A1c; Future  Need for hepatitis C screening test -     Hepatitis C Antibody; Future  Routine general medical examination at a health care facility- exam done, labs  ordered and reviewed, vaccines were reviewed and updated, mammo is UTD, PAP collected and sent, pt ed material was given. -     Lipid panel; Future -     Comprehensive metabolic panel; Future -     CBC with Differential/Platelet; Future -     TSH; Future -     Urinalysis, Routine w reflex microscopic (not at [facility]); Future -     Cytology - PAP  Visit for screening mammogram -     Cancel: MM DIGITAL SCREENING BILATERAL; Future  Screen for colon cancer -     Ambulatory referral to Gastroenterology  Need for varicella vaccine -     Varicella-zoster vaccine subcutaneous  Hyperlipidemia with target LDL less than 100- she has not achieved her LDL goal, will add zetia to the statin -     ezetimibe (ZETIA) 10 MG tablet; Take 1 tablet (10 mg total) by mouth daily.   I have discontinued Ms. Arp's amoxicillin-clavulanate. I am also having her start on ezetimibe and Dulaglutide. Additionally, I am  having her maintain her cetirizine, levalbuterol, Omeprazole-Sodium Bicarbonate, bromocriptine, Pitavastatin Calcium, olmesartan, montelukast, and metFORMIN.  Meds ordered this encounter  Medications  . ezetimibe (ZETIA) 10 MG tablet    Sig: Take 1 tablet (10 mg total) by mouth daily.    Dispense:  90 tablet    Refill:  3  . Dulaglutide (TRULICITY) 1.5 MG/0.5ML SOPN    Sig: Inject 1 Act into the skin once a week.    Dispense:  4 pen    Refill:  11     Follow-up: Return in about 4 months (around 02/01/2016).  Sanda Linger, MD

## 2015-10-05 ENCOUNTER — Telehealth: Payer: Self-pay | Admitting: Internal Medicine

## 2015-10-05 ENCOUNTER — Encounter: Payer: Self-pay | Admitting: Internal Medicine

## 2015-10-05 LAB — HEPATITIS C ANTIBODY: HCV Ab: NEGATIVE

## 2015-10-05 MED ORDER — EZETIMIBE 10 MG PO TABS: 10.0000 mg | ORAL_TABLET | Freq: Every day | ORAL | Status: DC

## 2015-10-05 MED ORDER — DULAGLUTIDE 1.5 MG/0.5ML ~~LOC~~ SOAJ: 1.0000 | SUBCUTANEOUS | Status: DC

## 2015-10-05 NOTE — Telephone Encounter (Signed)
LMOVM for pt to call back. If pt does call back and I am not available please inform of below.as well as note in "letter" tab. She will need a 30 min slot for education consultation

## 2015-10-05 NOTE — Telephone Encounter (Signed)
Brianna FasterGabby - let her know that her blood sugars are too high, I want to add a weekly trulicity injection to help lower her blood sugars and lose weight, we have samples if she wants to come in for a trial and education on his to administer the pen

## 2015-10-06 LAB — HM PAP SMEAR

## 2015-10-06 LAB — CYTOLOGY - PAP

## 2015-10-12 ENCOUNTER — Encounter: Payer: Self-pay | Admitting: Internal Medicine

## 2015-10-13 ENCOUNTER — Ambulatory Visit (INDEPENDENT_AMBULATORY_CARE_PROVIDER_SITE_OTHER): Payer: 59 | Admitting: Internal Medicine

## 2015-10-13 ENCOUNTER — Encounter: Payer: Self-pay | Admitting: Internal Medicine

## 2015-10-13 VITALS — BP 118/76 | HR 60 | Temp 97.9°F | Resp 16 | Ht 68.0 in | Wt 288.0 lb

## 2015-10-13 DIAGNOSIS — E118 Type 2 diabetes mellitus with unspecified complications: Secondary | ICD-10-CM

## 2015-10-13 MED ORDER — DULAGLUTIDE 1.5 MG/0.5ML ~~LOC~~ SOAJ: 1.0000 | SUBCUTANEOUS | Status: DC

## 2015-10-13 NOTE — Progress Notes (Signed)
Subjective:  Patient ID: Brianna Lambert, female    DOB: 26-Jun-1956  Age: 59 y.o. MRN: [id number]  CC: Diabetes   HPI NELL GALES presents for follow-up on diabetes. Her A1c is up to 8%. She came in today to start using Trulicity.  Outpatient Prescriptions Prior to Visit  Medication Sig Dispense Refill  . bromocriptine (PARLODEL) 2.5 MG tablet TAKE 1 TABLET BY MOUTH EVERY NIGHT AT BEDTIME. NEEDS APPOITNMENT FOR FURTHER FILLS 30 tablet 0  . cetirizine (ZYRTEC) 10 MG tablet Take 10 mg by mouth daily.      Marland Kitchen ezetimibe (ZETIA) 10 MG tablet Take 1 tablet (10 mg total) by mouth daily. 90 tablet 3  . levalbuterol (XOPENEX HFA) 45 MCG/ACT inhaler Inhale 1-2 puffs into the lungs every 4 (four) hours as needed. For shortness of breath     . metFORMIN (GLUCOPHAGE XR) 500 MG 24 hr tablet Take 1 tablet (500 mg total) by mouth 2 (two) times daily. 180 tablet 1  . montelukast (SINGULAIR) 10 MG tablet Take 1 tablet by mouth at  bedtime 90 tablet 3  . olmesartan (BENICAR) 40 MG tablet Take 1 tablet by mouth  daily 90 tablet 3  . Omeprazole-Sodium Bicarbonate (ZEGERID OTC) 20-1100 MG CAPS capsule Take 1 capsule by mouth 2 (two) times daily. 180 each 3  . Pitavastatin Calcium (LIVALO) 2 MG TABS Take 1 tablet (2 mg total) by mouth daily. 90 tablet 3  . Dulaglutide (TRULICITY) 1.5 MG/0.5ML SOPN Inject 1 Act into the skin once a week. 4 pen 11   No facility-administered medications prior to visit.    ROS Review of Systems  All other systems reviewed and are negative.   Objective:  BP 118/76 mmHg  Pulse 60  Temp(Src) 97.9 F (36.6 C) (Oral)  Resp 16  Ht  (1.727 m)  Wt 288 lb (130.636 kg)  BMI 43.80 kg/m2  SpO2 97%  BP Readings from Last 3 Encounters:  10/13/15 118/76  10/04/15 122/80  07/09/15 128/80    Wt Readings from Last 3 Encounters:  10/13/15 288 lb (130.636 kg)  10/04/15 289 lb (131.09 kg)  07/09/15 291 lb (131.997 kg)    Physical Exam  Constitutional: She is oriented  to person, place, and time.  HENT:  Mouth/Throat: Oropharynx is clear and moist. No oropharyngeal exudate.  Eyes: Right eye exhibits no discharge. Left eye exhibits no discharge. No scleral icterus.  Neck: Normal range of motion. Neck supple. No JVD present. No tracheal deviation present. No thyromegaly present.  Cardiovascular: Normal rate, regular rhythm, normal heart sounds and intact distal pulses.  Exam reveals no gallop and no friction rub.   No murmur heard. Pulmonary/Chest: Effort normal and breath sounds normal. No stridor. No respiratory distress. She has no wheezes. She has no rales. She exhibits no tenderness.  Abdominal: Soft. Bowel sounds are normal. She exhibits no distension and no mass. There is no tenderness. There is no rebound and no guarding.  Musculoskeletal: Normal range of motion. She exhibits no edema or tenderness.  Lymphadenopathy:    She has no cervical adenopathy.  Neurological: She is oriented to person, place, and time.  Skin: Skin is warm and dry. No rash noted. No erythema. No pallor.    Lab Results  Component Value Date   WBC 8.4 10/04/2015   HGB 14.5 10/04/2015   HCT 43.8 10/04/2015   PLT 293.0 10/04/2015   GLUCOSE 157* 10/04/2015   CHOL 217* 10/04/2015   TRIG 136.0 10/04/2015  HDL 42.00 10/04/2015   LDLDIRECT 155.6 07/11/2012   LDLCALC 148* 10/04/2015   ALT 39* 10/04/2015   AST 28 10/04/2015   NA 137 10/04/2015   K 3.9 10/04/2015   CL 103 10/04/2015   CREATININE 0.77 10/04/2015   BUN 16 10/04/2015   CO2 26 10/04/2015   TSH 0.76 10/04/2015   INR 0.97 10/27/2011   HGBA1C 8.0* 10/04/2015   MICROALBUR >0.7 10/04/2015    No results found.  Assessment & Plan:   [name] was seen today for diabetes.  Diagnoses and all orders for this visit:  Type 2 diabetes mellitus with complication, without long-term current use of insulin (HCC)- she was given her first dose here in the office and she was instructed how to use it. She will continue taking  metformin as directed. I will recheck her A1c in about 4 months. She agrees to work on her lifestyle modifications with diet, exercise, weight loss. -     Dulaglutide (TRULICITY) 1.5 MG/0.5ML SOPN; Inject 1 Act into the skin once a week.  I am having Ms. Mao maintain her cetirizine, levalbuterol, Omeprazole-Sodium Bicarbonate, bromocriptine, Pitavastatin Calcium, olmesartan, montelukast, metFORMIN, ezetimibe, and Dulaglutide.  Meds ordered this encounter  Medications  . Dulaglutide (TRULICITY) 1.5 MG/0.5ML SOPN    Sig: Inject 1 Act into the skin once a week.    Dispense:  4 pen    Refill:  11     Follow-up: Return in about 4 months (around 02/10/2016).  Sanda Lingerhomas Ana Liaw, MD

## 2015-10-13 NOTE — Patient Instructions (Signed)

## 2015-10-13 NOTE — Progress Notes (Signed)
Pre visit review using our clinic review tool, if applicable. No additional management support is needed unless otherwise documented below in the visit note. 

## 2015-10-18 LAB — HM DIABETES EYE EXAM

## 2015-10-19 ENCOUNTER — Encounter: Payer: Self-pay | Admitting: Internal Medicine

## 2015-10-21 ENCOUNTER — Other Ambulatory Visit: Payer: Self-pay | Admitting: Internal Medicine

## 2015-11-04 ENCOUNTER — Telehealth: Payer: Self-pay | Admitting: Internal Medicine

## 2015-11-04 NOTE — Telephone Encounter (Signed)
Patient would like to know if Dr. Yetta BarreJones has gotten any trulicity samples in.

## 2015-11-04 NOTE — Telephone Encounter (Signed)
Samples in fridge. LMVOM

## 2015-11-05 ENCOUNTER — Ambulatory Visit (AMBULATORY_SURGERY_CENTER): Payer: Self-pay | Admitting: *Deleted

## 2015-11-05 VITALS — Ht 67.0 in | Wt 284.0 lb

## 2015-11-05 DIAGNOSIS — Z1211 Encounter for screening for malignant neoplasm of colon: Secondary | ICD-10-CM

## 2015-11-05 MED ORDER — NA SULFATE-K SULFATE-MG SULF 17.5-3.13-1.6 GM/177ML PO SOLN: 1.0000 | Freq: Once | ORAL | Status: DC

## 2015-11-05 NOTE — Progress Notes (Signed)
No egg or soy allergy. No anesthesia problems.  No home O2.  No diet meds.  

## 2015-11-08 ENCOUNTER — Encounter: Payer: Self-pay | Admitting: Internal Medicine

## 2015-11-19 ENCOUNTER — Ambulatory Visit (AMBULATORY_SURGERY_CENTER): Payer: 59 | Admitting: Internal Medicine

## 2015-11-19 ENCOUNTER — Encounter: Payer: Self-pay | Admitting: Internal Medicine

## 2015-11-19 VITALS — BP 122/67 | HR 63 | Temp 98.6°F | Resp 25 | Ht 67.0 in | Wt 284.0 lb

## 2015-11-19 DIAGNOSIS — Z1211 Encounter for screening for malignant neoplasm of colon: Secondary | ICD-10-CM | POA: Diagnosis not present

## 2015-11-19 LAB — GLUCOSE, CAPILLARY
GLUCOSE-CAPILLARY: 138 mg/dL — AB (ref 65–99)
GLUCOSE-CAPILLARY: 129 mg/dL — AB (ref 65–99)

## 2015-11-19 MED ORDER — SODIUM CHLORIDE 0.9 % IV SOLN: 500.0000 mL | INTRAVENOUS | Status: DC

## 2015-11-19 NOTE — Progress Notes (Signed)
Patient awakening,vss,report to rn 

## 2015-11-19 NOTE — Patient Instructions (Signed)
Discharge instructions given. Normal exam. Resume previous medications. YOU HAD AN ENDOSCOPIC PROCEDURE TODAY AT THE [facility]:   Refer to the procedure report that was given to you for any specific questions about what was found during the examination.  If the procedure report does not answer your questions, please call your gastroenterologist to clarify.  If you requested that your care partner not be given the details of your procedure findings, then the procedure report has been included in a sealed envelope for you to review at your convenience later.  YOU SHOULD EXPECT: Some feelings of bloating in the abdomen. Passage of more gas than usual.  Walking can help get rid of the air that was put into your GI tract during the procedure and reduce the bloating. If you had a lower endoscopy (such as a colonoscopy or flexible sigmoidoscopy) you may notice spotting of blood in your stool or on the toilet paper. If you underwent a bowel prep for your procedure, you may not have a normal bowel movement for a few days.  Please Note:  You might notice some irritation and congestion in your nose or some drainage.  This is from the oxygen used during your procedure.  There is no need for concern and it should clear up in a day or so.  SYMPTOMS TO REPORT IMMEDIATELY:   Following lower endoscopy (colonoscopy or flexible sigmoidoscopy):  Excessive amounts of blood in the stool  Significant tenderness or worsening of abdominal pains  Swelling of the abdomen that is new, acute  Fever of 100F or higher   For urgent or emergent issues, a gastroenterologist can be reached at any hour by calling [phone].   DIET: Your first meal following the procedure should be a small meal and then it is ok to progress to your normal diet. Heavy or fried foods are harder to digest and may make you feel nauseous or bloated.  Likewise, meals heavy in dairy and vegetables can increase bloating.  Drink plenty  of fluids but you should avoid alcoholic beverages for 24 hours.  ACTIVITY:  You should plan to take it easy for the rest of today and you should NOT DRIVE or use heavy machinery until tomorrow (because of the sedation medicines used during the test).    FOLLOW UP: Our staff will call the number listed on your records the next business day following your procedure to check on you and address any questions or concerns that you may have regarding the information given to you following your procedure. If we do not reach you, we will leave a message.  However, if you are feeling well and you are not experiencing any problems, there is no need to return our call.  We will assume that you have returned to your regular daily activities without incident.  If any biopsies were taken you will be contacted by phone or by letter within the next 1-3 weeks.  Please call us at [phone] if you have not heard about the biopsies in 3 weeks.    SIGNATURES/CONFIDENTIALITY: You and/or your care partner have signed paperwork which will be entered into your electronic medical record.  These signatures attest to the fact that that the information above on your After Visit Summary has been reviewed and is understood.  Full responsibility of the confidentiality of this discharge information lies with you and/or your care-partner. 

## 2015-11-19 NOTE — Op Note (Signed)
[facility] [address] [place], [id number]   COLONOSCOPY PROCEDURE REPORT  PATIENT: Brianna Lambert, Brianna Lambert  MR#: [id number] BIRTHDATE: 14-Apr-1956 , 59  yrs. old GENDER: female ENDOSCOPIST: Beverley FiedlerJay Lambert Faustine Tates, MD REFERRED [id number]:Thomas Karsten RoL Jones, Lambert.D. PROCEDURE DATE:  11/19/2015 PROCEDURE:   Colonoscopy, screening First Screening Colonoscopy - Avg.  risk and is 50 yrs.  old or older - No.  Prior Negative Screening - Now for repeat screening. 10 or more years since last screening  History of Adenoma - Now for follow-up colonoscopy & has been > or = to 3 yrs.  N/A  Polyps removed today? No Recommend repeat exam, <10 yrs? No ASA CLASS:   Class III INDICATIONS:Screening for colonic neoplasia, Colorectal Neoplasm Risk Assessment for this procedure is average risk, and normal colonoscopy 10 yrs ago with Dr.  Kinnie ScalesMedoff. MEDICATIONS: Monitored anesthesia care and Propofol 400 mg IV  DESCRIPTION OF PROCEDURE:   After the risks benefits and alternatives of the procedure were thoroughly explained, informed consent was obtained.  The digital rectal exam revealed no abnormalities of the rectum.   The LB [id number]-[id number]  endoscope was introduced through the anus and advanced to the cecum, which was identified by both the appendix and ileocecal valve. No adverse events experienced.   The quality of the prep was good.  (Suprep was used)  The instrument was then slowly withdrawn as the colon was fully examined. Estimated blood loss is zero unless otherwise noted in this procedure report.      COLON FINDINGS: A normal appearing cecum, ileocecal valve, and appendiceal orifice were identified.  the ascending, transverse, descending, sigmoid colon, and rectum appeared unremarkable. Retroflexed views revealed anal canal skin tag and hypertrophied anal papillae. The time to cecum = 5.9 Withdrawal time = 8.1   The scope was withdrawn and the procedure completed. COMPLICATIONS: There were no  complications.  ENDOSCOPIC IMPRESSION: Normal colonoscopy  RECOMMENDATIONS: You should continue to follow colorectal cancer screening guidelines for "routine risk" patients with a repeat colonoscopy in 10 years. There is no need for FOBT (stool) testing for at least 5 years.  eSigned:  Beverley FiedlerJay Lambert Alpha Mysliwiec, MD 11/19/2015 8:23 AM   cc:  the patient, Dr. Yetta BarreJones

## 2015-11-23 ENCOUNTER — Telehealth: Payer: Self-pay | Admitting: *Deleted

## 2015-11-23 NOTE — Telephone Encounter (Signed)
  Follow up Call-  Call back number 11/19/2015  Post procedure Call Back phone  # [phone]  Permission to leave phone message Yes     Patient questions:  Do you have a fever, pain , or abdominal swelling? No. Pain Score  0 *  Have you tolerated food without any problems? Yes.    Have you been able to return to your normal activities? Yes.    Do you have any questions about your discharge instructions: Diet   No. Medications  No. Follow up visit  No.  Do you have questions or concerns about your Care? No.  Actions: * If pain score is 4 or above: No action needed, pain <4.

## 2015-11-24 LAB — HM COLONOSCOPY

## 2015-12-10 ENCOUNTER — Telehealth: Payer: Self-pay | Admitting: *Deleted

## 2015-12-10 DIAGNOSIS — I10 Essential (primary) hypertension: Secondary | ICD-10-CM

## 2015-12-10 MED ORDER — OLMESARTAN MEDOXOMIL 40 MG PO TABS: ORAL_TABLET | ORAL | Status: DC

## 2015-12-10 NOTE — Telephone Encounter (Signed)
Receive call pt states her [facility]. [facility], but in the mean time she is out of her Benicar. Needing a 30 day sent to [facility]. Inform  Pt sending electronically...Raechel Chute

## 2016-01-12 ENCOUNTER — Telehealth: Payer: Self-pay | Admitting: Internal Medicine

## 2016-01-12 NOTE — Telephone Encounter (Signed)
Error

## 2016-01-14 ENCOUNTER — Telehealth: Payer: Self-pay | Admitting: Internal Medicine

## 2016-01-14 DIAGNOSIS — E118 Type 2 diabetes mellitus with unspecified complications: Secondary | ICD-10-CM

## 2016-01-14 NOTE — Telephone Encounter (Signed)
Pt states she left a vm on Dahlia's phone regarding the PA for Dulaglutide (TRULICITY) 1.5 MG/0.5ML SOPN [[id number]] and she is following up on this.   Also, she was told to call [name] back to follow up on any samples you may have for her.

## 2016-01-17 NOTE — Telephone Encounter (Signed)
Unable to initiated PA, pt's insurance information no longer valid per insurance company. Left message on machine for pt to return my call with updated insurance info.

## 2016-01-18 MED ORDER — DULAGLUTIDE 1.5 MG/0.5ML ~~LOC~~ SOAJ: 1.0000 | SUBCUTANEOUS | Status: DC

## 2016-01-18 NOTE — Telephone Encounter (Signed)
Per pt, pharmacy ([facility]) PA is not required. A new Rx is what was requested.  Pt insisted that she was told that "authorization" was needed from MD's office and forwarded email she received from mail order pharmacy to me:  Order Placed on 2015-12-22  Prescription  Invoice  Reason         Rx#: [id number] See below (1)    1. We can't fill your prescription as requested because we did not get your doctors approval. Please ask your doctor to send [place] a new prescription electronically, by fax or you can send a new prescription by mail. If we get your doctors approval before we get your new prescription, we'll process your order. Log in for more You can get more information about your prescriptions online.   Pt advised of same, advised that email is more likely a request for a new Rx and that I would sent new prescription.

## 2016-01-18 NOTE — Telephone Encounter (Signed)
Left message on machine for pt to return my call  

## 2016-01-20 ENCOUNTER — Telehealth: Payer: Self-pay | Admitting: Internal Medicine

## 2016-01-20 DIAGNOSIS — E118 Type 2 diabetes mellitus with unspecified complications: Secondary | ICD-10-CM

## 2016-01-20 MED ORDER — DULAGLUTIDE 1.5 MG/0.5ML ~~LOC~~ SOAJ: 1.0000 | SUBCUTANEOUS | Status: DC

## 2016-01-20 NOTE — Telephone Encounter (Signed)
LVM for pt to call back as soon as possible.   RE: samples are available for pt to pick up.

## 2016-01-20 NOTE — Telephone Encounter (Signed)
Pt called regarding samples of the Trulicity.  The prescription finally got to [facility], but she is needing some now.  Can you please call her at [phone]

## 2016-01-20 NOTE — Telephone Encounter (Signed)
Yes, we do have samples. 

## 2016-01-21 NOTE — Telephone Encounter (Signed)
Patient called back and I informed

## 2016-01-26 ENCOUNTER — Ambulatory Visit (INDEPENDENT_AMBULATORY_CARE_PROVIDER_SITE_OTHER): Payer: 59 | Admitting: Internal Medicine

## 2016-01-26 ENCOUNTER — Encounter: Payer: Self-pay | Admitting: Internal Medicine

## 2016-01-26 ENCOUNTER — Other Ambulatory Visit: Payer: 59

## 2016-01-26 ENCOUNTER — Other Ambulatory Visit (INDEPENDENT_AMBULATORY_CARE_PROVIDER_SITE_OTHER): Payer: 59

## 2016-01-26 VITALS — BP 120/80 | HR 61 | Temp 97.9°F | Resp 16 | Ht 67.0 in | Wt 269.0 lb

## 2016-01-26 DIAGNOSIS — E118 Type 2 diabetes mellitus with unspecified complications: Secondary | ICD-10-CM

## 2016-01-26 DIAGNOSIS — E785 Hyperlipidemia, unspecified: Secondary | ICD-10-CM

## 2016-01-26 LAB — HEMOGLOBIN A1C: Hgb A1c MFr Bld: 6.2 % (ref 4.6–6.5)

## 2016-01-26 LAB — BASIC METABOLIC PANEL
BUN: 11 mg/dL (ref 6–23)
CALCIUM: 9.5 mg/dL (ref 8.4–10.5)
CHLORIDE: 102 meq/L (ref 96–112)
CO2: 27 mEq/L (ref 19–32)
CREATININE: 0.69 mg/dL (ref 0.40–1.20)
GFR: 92.07 mL/min (ref >60.00)
Glucose, Bld: 102 mg/dL — ABNORMAL HIGH (ref 70–99)
Potassium: 4.2 mEq/L (ref 3.5–5.1)
Sodium: 138 mEq/L (ref 135–145)

## 2016-01-26 MED ORDER — EZETIMIBE 10 MG PO TABS: 10.0000 mg | ORAL_TABLET | Freq: Every day | ORAL | Status: DC

## 2016-01-26 MED ORDER — ASPIRIN EC 81 MG PO TBEC: 81.0000 mg | DELAYED_RELEASE_TABLET | Freq: Every day | ORAL | Status: DC

## 2016-01-26 MED ORDER — PITAVASTATIN CALCIUM 4 MG PO TABS: 1.0000 | ORAL_TABLET | Freq: Every day | ORAL | Status: DC

## 2016-01-26 NOTE — Progress Notes (Signed)
Pre visit review using our clinic review tool, if applicable. No additional management support is needed unless otherwise documented below in the visit note. 

## 2016-01-26 NOTE — Patient Instructions (Signed)

## 2016-01-26 NOTE — Progress Notes (Signed)
Subjective:  Patient ID: Brianna Lambert, female    DOB: 11-Nov-1956  Age: 60 y.o. MRN: [id number]  CC: Diabetes; Hypertension; and Hyperlipidemia   HPI Brianna Lambert presents for a f/up on DM2 -  Since I last saw her she has improved on her lifestyle modifications with diet and exercise and has lost over 15 pounds. She feels well today and offers no complaints.  She has tried to lower her cholesterol level with Zetia but has not been effective. She tells me she is willing to start taking a statin.  Outpatient Prescriptions Prior to Visit  Medication Sig Dispense Refill  . cetirizine (ZYRTEC) 10 MG tablet Take 10 mg by mouth daily.      . Dulaglutide (TRULICITY) 1.5 MG/0.5ML SOPN Inject 1 Act into the skin once a week. 1 pen 0  . levalbuterol (XOPENEX HFA) 45 MCG/ACT inhaler Inhale 1-2 puffs into the lungs every 4 (four) hours as needed. Reported on 11/05/2015    . metFORMIN (GLUCOPHAGE XR) 500 MG 24 hr tablet Take 1 tablet (500 mg total) by mouth 2 (two) times daily. 180 tablet 1  . montelukast (SINGULAIR) 10 MG tablet Take 1 tablet by mouth at  bedtime 90 tablet 3  . olmesartan (BENICAR) 40 MG tablet Take 1 tablet by mouth  daily 30 tablet 0  . ranitidine (ZANTAC) 150 MG tablet Take 150 mg by mouth 2 (two) times daily.    Marland Kitchen. ezetimibe (ZETIA) 10 MG tablet Take 1 tablet (10 mg total) by mouth daily. (Patient not taking: Reported on 01/26/2016) 90 tablet 3   No facility-administered medications prior to visit.    ROS Review of Systems  Constitutional: Negative.  Negative for fatigue and unexpected weight change.  HENT: Negative.   Eyes: Negative.   Respiratory: Negative.  Negative for cough, choking, chest tightness, shortness of breath and stridor.   Cardiovascular: Negative.  Negative for chest pain, palpitations and leg swelling.  Gastrointestinal: Negative.  Negative for nausea, vomiting, abdominal pain, diarrhea and constipation.  Endocrine: Negative.  Negative for polydipsia,  polyphagia and polyuria.  Genitourinary: Negative.   Musculoskeletal: Negative.  Negative for myalgias, back pain and neck pain.  Skin: Negative.  Negative for color change and rash.  Allergic/Immunologic: Negative.   Neurological: Negative.  Negative for dizziness.  Hematological: Negative.  Negative for adenopathy. Does not bruise/bleed easily.  Psychiatric/Behavioral: Negative.     Objective:  BP 120/80 mmHg  Pulse 61  Temp(Src) 97.9 F (36.6 C) (Oral)  Resp 16  Ht 5\' 7"  (1.702 m)  Wt 269 lb (122.018 kg)  BMI 42.12 kg/m2  SpO2 98%  BP Readings from Last 3 Encounters:  01/26/16 120/80  11/19/15 122/67  10/13/15 118/76    Wt Readings from Last 3 Encounters:  01/26/16 269 lb (122.018 kg)  11/19/15 284 lb (128.822 kg)  11/05/15 284 lb (128.822 kg)    Physical Exam  Constitutional: She is oriented to person, place, and time. No distress.  HENT:  Mouth/Throat: Oropharynx is clear and moist. No oropharyngeal exudate.  Eyes: Conjunctivae are normal. Right eye exhibits no discharge. Left eye exhibits no discharge. No scleral icterus.  Neck: Normal range of motion. Neck supple. No JVD present. No tracheal deviation present. No thyromegaly present.  Cardiovascular: Normal rate, regular rhythm, normal heart sounds and intact distal pulses.  Exam reveals no gallop and no friction rub.   No murmur heard. Pulmonary/Chest: Effort normal and breath sounds normal. No stridor. No respiratory distress. She has no  wheezes. She has no rales. She exhibits no tenderness.  Abdominal: Soft. Bowel sounds are normal. She exhibits no distension and no mass. There is no tenderness. There is no rebound and no guarding.  Musculoskeletal: Normal range of motion. She exhibits no edema or tenderness.  Lymphadenopathy:    She has no cervical adenopathy.  Neurological: She is oriented to person, place, and time.  Skin: Skin is warm and dry. No rash noted. She is not diaphoretic. No erythema. No pallor.    Vitals reviewed.   Lab Results  Component Value Date   WBC 8.4 10/04/2015   HGB 14.5 10/04/2015   HCT 43.8 10/04/2015   PLT 293.0 10/04/2015   GLUCOSE 102* 01/26/2016   CHOL 217* 10/04/2015   TRIG 136.0 10/04/2015   HDL 42.00 10/04/2015   LDLDIRECT 155.6 07/11/2012   LDLCALC 148* 10/04/2015   ALT 39* 10/04/2015   AST 28 10/04/2015   NA 138 01/26/2016   K 4.2 01/26/2016   CL 102 01/26/2016   CREATININE 0.69 01/26/2016   BUN 11 01/26/2016   CO2 27 01/26/2016   TSH 0.76 10/04/2015   INR 0.97 10/27/2011   HGBA1C 6.2 01/26/2016   MICROALBUR >0.7 10/04/2015    No results found.  Assessment & Plan:   Brianna Lambert was seen today for diabetes, hypertension and hyperlipidemia.  Diagnoses and all orders for this visit:  Type 2 diabetes mellitus with complication, without long-term current use of insulin (HCC)-  Her A1c is down to 6.2%, significant improvement noted, for now will continue the current medications. -     aspirin EC 81 MG tablet; Take 1 tablet (81 mg total) by mouth daily. -     Basic metabolic panel; Future -     Hemoglobin A1c; Future  Hyperlipidemia with target LDL less than 100-  I have asked her to add a statin to Zetia. This will help lower her risk for heart attack and stroke. -     ezetimibe (ZETIA) 10 MG tablet; Take 1 tablet (10 mg total) by mouth daily. -     Pitavastatin Calcium (LIVALO) 4 MG TABS; Take 1 tablet (4 mg total) by mouth daily.   I am having Brianna Lambert start on Pitavastatin Calcium and aspirin EC. I am also having her maintain her cetirizine, levalbuterol, montelukast, metFORMIN, ranitidine, olmesartan, Dulaglutide, and ezetimibe.  Meds ordered this encounter  Medications  . ezetimibe (ZETIA) 10 MG tablet    Sig: Take 1 tablet (10 mg total) by mouth daily.    Dispense:  90 tablet    Refill:  3  . Pitavastatin Calcium (LIVALO) 4 MG TABS    Sig: Take 1 tablet (4 mg total) by mouth daily.    Dispense:  90 tablet    Refill:  3  . aspirin  EC 81 MG tablet    Sig: Take 1 tablet (81 mg total) by mouth daily.    Dispense:  90 tablet    Refill:  3     Follow-up: Return in about 4 months (around 05/27/2016).  Sanda Linger, MD

## 2016-01-27 ENCOUNTER — Encounter: Payer: Self-pay | Admitting: Internal Medicine

## 2016-02-05 ENCOUNTER — Ambulatory Visit (INDEPENDENT_AMBULATORY_CARE_PROVIDER_SITE_OTHER): Payer: 59 | Admitting: Adult Health

## 2016-02-05 ENCOUNTER — Encounter: Payer: Self-pay | Admitting: Adult Health

## 2016-02-05 VITALS — BP 118/70 | HR 71 | Temp 98.5°F | Resp 20 | Ht 67.0 in | Wt 264.5 lb

## 2016-02-05 DIAGNOSIS — J209 Acute bronchitis, unspecified: Secondary | ICD-10-CM

## 2016-02-05 DIAGNOSIS — J069 Acute upper respiratory infection, unspecified: Secondary | ICD-10-CM | POA: Diagnosis not present

## 2016-02-05 DIAGNOSIS — Z760 Encounter for issue of repeat prescription: Secondary | ICD-10-CM

## 2016-02-05 MED ORDER — LEVALBUTEROL TARTRATE 45 MCG/ACT IN AERO: 1.0000 | INHALATION_SPRAY | RESPIRATORY_TRACT | Status: DC | PRN

## 2016-02-05 MED ORDER — PREDNISONE 10 MG PO TABS: 10.0000 mg | ORAL_TABLET | Freq: Every day | ORAL | Status: DC

## 2016-02-05 NOTE — Progress Notes (Signed)
Pre visit review using our clinic review tool, if applicable. No additional management support is needed unless otherwise documented below in the visit note. 

## 2016-02-05 NOTE — Patient Instructions (Addendum)
It was great meeting you today  I have sent in a new prescription for your inhaler as well as a 9 day course of prednisone.   Take as directed  40 mg x 3 days 20 mg x 3 days 10 mg x 3 days  Acute Bronchitis Bronchitis is inflammation of the airways that extend from the windpipe into the lungs (bronchi). The inflammation often causes mucus to develop. This leads to a cough, which is the most common symptom of bronchitis.  In acute bronchitis, the condition usually develops suddenly and goes away over time, usually in a couple weeks. Smoking, allergies, and asthma can make bronchitis worse. Repeated episodes of bronchitis may cause further lung problems.  CAUSES Acute bronchitis is most often caused by the same virus that causes a cold. The virus can spread from person to person (contagious) through coughing, sneezing, and touching contaminated objects. SIGNS AND SYMPTOMS   Cough.   Fever.   Coughing up mucus.   Body aches.   Chest congestion.   Chills.   Shortness of breath.   Sore throat.  DIAGNOSIS  Acute bronchitis is usually diagnosed through a physical exam. Your health care provider will also ask you questions about your medical history. Tests, such as chest X-rays, are sometimes done to rule out other conditions.  TREATMENT  Acute bronchitis usually goes away in a couple weeks. Oftentimes, no medical treatment is necessary. Medicines are sometimes given for relief of fever or cough. Antibiotic medicines are usually not needed but may be prescribed in certain situations. In some cases, an inhaler may be recommended to help reduce shortness of breath and control the cough. A cool mist vaporizer may also be used to help thin bronchial secretions and make it easier to clear the chest.  HOME CARE INSTRUCTIONS  Get plenty of rest.   Drink enough fluids to keep your urine clear or pale yellow (unless you have a medical condition that requires fluid restriction).  Increasing fluids may help thin your respiratory secretions (sputum) and reduce chest congestion, and it will prevent dehydration.   Take medicines only as directed by your health care provider.  If you were prescribed an antibiotic medicine, finish it all even if you start to feel better.  Avoid smoking and secondhand smoke. Exposure to cigarette smoke or irritating chemicals will make bronchitis worse. If you are a smoker, consider using nicotine gum or skin patches to help control withdrawal symptoms. Quitting smoking will help your lungs heal faster.   Reduce the chances of another bout of acute bronchitis by washing your hands frequently, avoiding people with cold symptoms, and trying not to touch your hands to your mouth, nose, or eyes.   Keep all follow-up visits as directed by your health care provider.  SEEK MEDICAL CARE IF: Your symptoms do not improve after 1 week of treatment.  SEEK IMMEDIATE MEDICAL CARE IF:  You develop an increased fever or chills.   You have chest pain.   You have severe shortness of breath.  You have bloody sputum.   You develop dehydration.  You faint or repeatedly feel like you are going to pass out.  You develop repeated vomiting.  You develop a severe headache. MAKE SURE YOU:   Understand these instructions.  Will watch your condition.  Will get help right away if you are not doing well or get worse.   This information is not intended to replace advice given to you by your health care provider. Make sure  you discuss any questions you have with your health care provider.   Document Released: 12/14/2004 Document Revised: 11/27/2014 Document Reviewed: 04/29/2013 Elsevier Interactive Patient Education [employer].

## 2016-02-05 NOTE — Progress Notes (Signed)
   Subjective:    Patient ID: Brianna Lambert, female    DOB: 07-07-1956, 60 y.o.   MRN: [id number]  Cough This is a new problem. The current episode started in the past 7 days. The problem has been gradually worsening. The problem occurs constantly. The cough is non-productive. Associated symptoms include headaches, nasal congestion, postnasal drip and shortness of breath. Pertinent negatives include no wheezing. She has tried steroid inhaler (normal saline spray) for the symptoms. The treatment provided no relief. Her past medical history is significant for asthma and bronchitis.   She has had bronchitis in the past and feels as though this is what it going on   Review of Systems  Constitutional: Negative.   HENT: Positive for postnasal drip.   Respiratory: Positive for cough and shortness of breath. Negative for wheezing.   Cardiovascular: Negative.   Neurological: Positive for headaches.       Objective:   Physical Exam  Constitutional: She is oriented to person, place, and time. She appears well-developed and well-nourished. No distress.  HENT:  Head: Normocephalic and atraumatic.  Right Ear: External ear normal.  Left Ear: External ear normal.  Nose: Right sinus exhibits frontal sinus tenderness. Left sinus exhibits frontal sinus tenderness.  Mouth/Throat: Oropharynx is clear and moist. No oropharyngeal exudate.  Cardiovascular: Normal rate, regular rhythm, normal heart sounds and intact distal pulses.   Pulmonary/Chest: Effort normal and breath sounds normal. No respiratory distress. She has no wheezes. She has no rales. She exhibits no tenderness.  Neurological: She is alert and oriented to person, place, and time.  Skin: Skin is warm and dry. No rash noted. She is not diaphoretic. No erythema. No pallor.  Psychiatric: She has a normal mood and affect. Her behavior is normal. Judgment and thought content normal.  Nursing note and vitals reviewed.      Assessment & Plan:    1. Acute upper respiratory infection - predniSONE (DELTASONE) 10 MG tablet; Take 1 tablet (10 mg total) by mouth daily with breakfast. 40 mg x 3 days, 20 mg x 3 days, 10 mg x 3 days  Dispense: 21 tablet; Refill: 0 - Flonase - Do not think it is bacterial  2. Medication refill - levalbuterol (XOPENEX HFA) 45 MCG/ACT inhaler; Inhale 1-2 puffs into the lungs every 4 (four) hours as needed. Reported on 11/05/2015  Dispense: 1 Inhaler; Refill: 6  3. Acute bronchitis, unspecified organism - predniSONE (DELTASONE) 10 MG tablet; Take 1 tablet (10 mg total) by mouth daily with breakfast. 40 mg x 3 days, 20 mg x 3 days, 10 mg x 3 days  Dispense: 21 tablet; Refill: 0

## 2016-02-28 ENCOUNTER — Other Ambulatory Visit: Payer: Self-pay | Admitting: Internal Medicine

## 2016-04-13 ENCOUNTER — Other Ambulatory Visit: Payer: Self-pay | Admitting: Internal Medicine

## 2016-05-19 ENCOUNTER — Other Ambulatory Visit: Payer: Self-pay | Admitting: Internal Medicine

## 2016-05-24 ENCOUNTER — Ambulatory Visit (INDEPENDENT_AMBULATORY_CARE_PROVIDER_SITE_OTHER): Payer: 59 | Admitting: Internal Medicine

## 2016-05-24 ENCOUNTER — Encounter: Payer: Self-pay | Admitting: Internal Medicine

## 2016-05-24 ENCOUNTER — Other Ambulatory Visit (INDEPENDENT_AMBULATORY_CARE_PROVIDER_SITE_OTHER): Payer: 59

## 2016-05-24 VITALS — BP 102/70 | HR 84 | Temp 98.2°F | Resp 16 | Wt 251.0 lb

## 2016-05-24 DIAGNOSIS — R300 Dysuria: Secondary | ICD-10-CM | POA: Diagnosis not present

## 2016-05-24 DIAGNOSIS — E118 Type 2 diabetes mellitus with unspecified complications: Secondary | ICD-10-CM | POA: Diagnosis not present

## 2016-05-24 DIAGNOSIS — E785 Hyperlipidemia, unspecified: Secondary | ICD-10-CM

## 2016-05-24 DIAGNOSIS — I10 Essential (primary) hypertension: Secondary | ICD-10-CM

## 2016-05-24 LAB — BASIC METABOLIC PANEL
BUN: 16 mg/dL (ref 6–23)
CALCIUM: 9.6 mg/dL (ref 8.4–10.5)
CO2: 30 mEq/L (ref 19–32)
CREATININE: 0.82 mg/dL (ref 0.40–1.20)
Chloride: 101 mEq/L (ref 96–112)
GFR: 75.36 mL/min (ref >60.00)
Glucose, Bld: 128 mg/dL — ABNORMAL HIGH (ref 70–99)
Potassium: 4.2 mEq/L (ref 3.5–5.1)
Sodium: 136 mEq/L (ref 135–145)

## 2016-05-24 LAB — LIPID PANEL
CHOLESTEROL: 129 mg/dL (ref 0–200)
HDL: 48.2 mg/dL (ref >39.00)
LDL Cholesterol: 61 mg/dL (ref 0–99)
NonHDL: 81.22
TRIGLYCERIDES: 102 mg/dL (ref 0.0–149.0)
Total CHOL/HDL Ratio: 3
VLDL: 20.4 mg/dL (ref 0.0–40.0)

## 2016-05-24 LAB — URINALYSIS, ROUTINE W REFLEX MICROSCOPIC
Ketones, ur: NEGATIVE
Leukocytes, UA: NEGATIVE
NITRITE: NEGATIVE
PH: 6 (ref 5.0–8.0)
Specific Gravity, Urine: 1.02 (ref 1.000–1.030)
URINE GLUCOSE: NEGATIVE
UROBILINOGEN UA: 1 (ref 0.0–1.0)

## 2016-05-24 LAB — HEMOGLOBIN A1C: HEMOGLOBIN A1C: 6.2 % (ref 4.6–6.5)

## 2016-05-24 NOTE — Patient Instructions (Signed)

## 2016-05-24 NOTE — Progress Notes (Signed)
Subjective:  Patient ID: Brianna BoJudy M Matar, female    DOB: 10/25/1956  Age: 60 y.o. MRN: [id number]  CC: Hypertension; Diabetes; and Urinary Tract Infection   HPI Brianna Lambert presents for follow-up on hypertension and type 2 diabetes mellitus. Since I last saw her she has been able to lose weight with decreased caloric intake and increased exercise. She complains today of a 2 week hx of urinary frequency and concerns that her urine is darker than usual. She is concerned she might have a bladder infection. She denies dysuria, hematuria, abdominal pain, nausea, vomiting, fever, or chills.  Outpatient Prescriptions Prior to Visit  Medication Sig Dispense Refill  . aspirin EC 81 MG tablet Take 1 tablet (81 mg total) by mouth daily. 90 tablet 3  . cetirizine (ZYRTEC) 10 MG tablet Take 10 mg by mouth daily.      . Dulaglutide (TRULICITY) 1.5 MG/0.5ML SOPN Inject 1 Act into the skin once a week. 1 pen 0  . ezetimibe (ZETIA) 10 MG tablet Take 1 tablet (10 mg total) by mouth daily. 90 tablet 3  . levalbuterol (XOPENEX HFA) 45 MCG/ACT inhaler Inhale 1-2 puffs into the lungs every 4 (four) hours as needed. Reported on 11/05/2015 1 Inhaler 6  . metFORMIN (GLUCOPHAGE-XR) 500 MG 24 hr tablet TAKE ONE TABLET BY MOUTH TWICE A DAY 60 tablet 4  . montelukast (SINGULAIR) 10 MG tablet Take 1 tablet (10 mg total) by mouth at bedtime. 90 tablet 3  . olmesartan (BENICAR) 40 MG tablet TAKE 1 TABLET BY MOUTH  DAILY 90 tablet 2  . Pitavastatin Calcium (LIVALO) 4 MG TABS Take 1 tablet (4 mg total) by mouth daily. 90 tablet 3  . ranitidine (ZANTAC) 150 MG tablet Take 150 mg by mouth 2 (two) times daily.    . predniSONE (DELTASONE) 10 MG tablet Take 1 tablet (10 mg total) by mouth daily with breakfast. 40 mg x 3 days, 20 mg x 3 days, 10 mg x 3 days (Patient not taking: Reported on 05/24/2016) 21 tablet 0   No facility-administered medications prior to visit.    ROS Review of Systems  Constitutional: Negative.   Negative for fever, chills, activity change and fatigue.  HENT: Negative.   Eyes: Negative.  Negative for visual disturbance.  Respiratory: Negative.  Negative for cough, choking, chest tightness, shortness of breath and stridor.   Cardiovascular: Negative.  Negative for chest pain, palpitations and leg swelling.  Gastrointestinal: Negative.  Negative for nausea, vomiting, abdominal pain, diarrhea, constipation and blood in stool.  Endocrine: Negative.   Genitourinary: Positive for frequency. Negative for dysuria, urgency, hematuria, flank pain, decreased urine volume, enuresis, difficulty urinating and dyspareunia.  Musculoskeletal: Negative.  Negative for myalgias, back pain, arthralgias and neck pain.  Skin: Negative.  Negative for color change and rash.  Allergic/Immunologic: Negative.   Neurological: Negative.  Negative for dizziness, tremors, weakness and numbness.  Hematological: Negative.  Negative for adenopathy. Does not bruise/bleed easily.  Psychiatric/Behavioral: Negative.     Objective:  BP 102/70 mmHg  Pulse 84  Temp(Src) 98.2 F (36.8 C) (Oral)  Resp 16  Wt 251 lb (113.853 kg)  SpO2 97%  BP Readings from Last 3 Encounters:  05/24/16 102/70  02/05/16 118/70  01/26/16 120/80    Wt Readings from Last 3 Encounters:  05/24/16 251 lb (113.853 kg)  02/05/16 264 lb 8 oz (119.976 kg)  01/26/16 269 lb (122.018 kg)    Physical Exam  Constitutional: She is oriented to person, place,  and time. No distress.  HENT:  Mouth/Throat: Oropharynx is clear and moist. No oropharyngeal exudate.  Eyes: Conjunctivae are normal. Right eye exhibits no discharge. Left eye exhibits no discharge. No scleral icterus.  Neck: Normal range of motion. Neck supple. No JVD present. No tracheal deviation present. No thyromegaly present.  Cardiovascular: Normal rate, regular rhythm, normal heart sounds and intact distal pulses.  Exam reveals no gallop and no friction rub.   No murmur  heard. Pulmonary/Chest: Effort normal and breath sounds normal. No stridor. No respiratory distress. She has no wheezes. She has no rales. She exhibits no tenderness.  Abdominal: Soft. Bowel sounds are normal. She exhibits no distension and no mass. There is no tenderness. There is no rebound and no guarding.  Musculoskeletal: Normal range of motion. She exhibits no edema or tenderness.  Lymphadenopathy:    She has no cervical adenopathy.  Neurological: She is oriented to person, place, and time.  Skin: Skin is warm and dry. No rash noted. She is not diaphoretic. No erythema. No pallor.  Vitals reviewed.   Lab Results  Component Value Date   WBC 8.4 10/04/2015   HGB 14.5 10/04/2015   HCT 43.8 10/04/2015   PLT 293.0 10/04/2015   GLUCOSE 128* 05/24/2016   CHOL 129 05/24/2016   TRIG 102.0 05/24/2016   HDL 48.20 05/24/2016   LDLDIRECT 155.6 07/11/2012   LDLCALC 61 05/24/2016   ALT 39* 10/04/2015   AST 28 10/04/2015   NA 136 05/24/2016   K 4.2 05/24/2016   CL 101 05/24/2016   CREATININE 0.82 05/24/2016   BUN 16 05/24/2016   CO2 30 05/24/2016   TSH 0.76 10/04/2015   INR 0.97 10/27/2011   HGBA1C 6.2 05/24/2016   MICROALBUR >0.7 10/04/2015    No results found.  Assessment & Plan:   Brianna Lambert was seen today for hypertension, diabetes and urinary tract infection.  Diagnoses and all orders for this visit:  Essential hypertension, benign- her blood pressure is well-controlled, like to lites and renal function are stable. -     Basic metabolic panel; Future  Type 2 diabetes mellitus with complication, without long-term current use of insulin (HCC)- her A1c is down to 6.2%, her blood sugars are very well controlled, she was praised for her lifestyle modifications that have helped her lose weight. -     Basic metabolic panel; Future -     Hemoglobin A1c; Future  Hyperlipidemia with target LDL less than 100- she has achieved her LDL goal is doing well on Zetia and Livalo -     Lipid  panel; Future  Dysuria- her symptoms are not compelling for bladder infection and a urinalysis is unremarkable, I will check a urine culture and will treat if indicated. -     Urinalysis, Routine w reflex microscopic ([facility]); Future -     CULTURE, URINE COMPREHENSIVE; Future   I have discontinued Ms. Devol's predniSONE. I am also having her maintain her cetirizine, ranitidine, Dulaglutide, ezetimibe, Pitavastatin Calcium, aspirin EC, levalbuterol, montelukast, metFORMIN, and olmesartan.  No orders of the defined types were placed in this encounter.     Follow-up: Return in about 6 months (around 11/24/2016).  Sanda Lingerhomas Sadie Hazelett, MD

## 2016-05-24 NOTE — Progress Notes (Signed)
Pre visit review using our clinic review tool, if applicable. No additional management support is needed unless otherwise documented below in the visit note. 

## 2016-05-26 ENCOUNTER — Other Ambulatory Visit: Payer: Self-pay | Admitting: Internal Medicine

## 2016-05-26 DIAGNOSIS — N390 Urinary tract infection, site not specified: Principal | ICD-10-CM

## 2016-05-26 DIAGNOSIS — B9620 Unspecified Escherichia coli [E. coli] as the cause of diseases classified elsewhere: Secondary | ICD-10-CM

## 2016-05-26 LAB — CULTURE, URINE COMPREHENSIVE

## 2016-05-26 MED ORDER — SULFAMETHOXAZOLE-TRIMETHOPRIM 800-160 MG PO TABS: 1.0000 | ORAL_TABLET | Freq: Two times a day (BID) | ORAL | Status: AC

## 2016-05-31 ENCOUNTER — Ambulatory Visit: Payer: 59 | Admitting: Internal Medicine

## 2016-06-05 ENCOUNTER — Telehealth: Payer: Self-pay | Admitting: Internal Medicine

## 2016-06-05 ENCOUNTER — Other Ambulatory Visit: Payer: Self-pay | Admitting: Internal Medicine

## 2016-06-05 DIAGNOSIS — N390 Urinary tract infection, site not specified: Principal | ICD-10-CM

## 2016-06-05 DIAGNOSIS — B9620 Unspecified Escherichia coli [E. coli] as the cause of diseases classified elsewhere: Secondary | ICD-10-CM

## 2016-06-05 NOTE — Telephone Encounter (Signed)
Recheck urine Lab ordered

## 2016-06-05 NOTE — Telephone Encounter (Signed)
Pt came in 05/24/16 for UTI, finished antibiotic Saturday but still experiencing same symptoms. Please advise.

## 2016-06-05 NOTE — Telephone Encounter (Signed)
LVM for pt to call back as soon as possible.   RE: Pt can go to the lab at her earliest convenience.

## 2016-06-05 NOTE — Telephone Encounter (Signed)
Please advise 

## 2016-06-06 ENCOUNTER — Other Ambulatory Visit (INDEPENDENT_AMBULATORY_CARE_PROVIDER_SITE_OTHER): Payer: 59

## 2016-06-06 DIAGNOSIS — N390 Urinary tract infection, site not specified: Secondary | ICD-10-CM

## 2016-06-06 DIAGNOSIS — B9620 Unspecified Escherichia coli [E. coli] as the cause of diseases classified elsewhere: Secondary | ICD-10-CM

## 2016-06-06 LAB — URINALYSIS, ROUTINE W REFLEX MICROSCOPIC
BILIRUBIN URINE: NEGATIVE
HGB URINE DIPSTICK: NEGATIVE
Ketones, ur: NEGATIVE
LEUKOCYTES UA: NEGATIVE
NITRITE: NEGATIVE
RBC / HPF: NONE SEEN (ref 0–?)
Specific Gravity, Urine: 1.01 (ref 1.000–1.030)
Total Protein, Urine: NEGATIVE
URINE GLUCOSE: NEGATIVE
Urobilinogen, UA: 0.2 (ref 0.0–1.0)
WBC UA: NONE SEEN (ref 0–?)
pH: 5.5 (ref 5.0–8.0)

## 2016-06-08 LAB — CULTURE, URINE COMPREHENSIVE
COLONY COUNT: NO GROWTH
ORGANISM ID, BACTERIA: NO GROWTH

## 2016-06-14 ENCOUNTER — Other Ambulatory Visit: Payer: Self-pay | Admitting: Internal Medicine

## 2016-06-14 DIAGNOSIS — E118 Type 2 diabetes mellitus with unspecified complications: Secondary | ICD-10-CM

## 2016-06-23 ENCOUNTER — Telehealth: Payer: Self-pay

## 2016-06-23 NOTE — Telephone Encounter (Signed)
Patient states she had got a call today from stefannie. I do not see anything in the notes or such for a reason she could of called today. I see where she had called on 7/17. I am wondering if she just was listening to an old VM. If you need to follow up, please do. Thank you.

## 2016-06-24 IMAGING — CT CT WRIST*L* W/O CM
2 of 4 series · 5 of 14 positions shown, 6 images · non-contrast
Comparison: None.

CLINICAL DATA: Left wrist pain, healing scaphoid fracture.

EXAM:
CT OF THE LEFT WRIST WITHOUT CONTRAST
TECHNIQUE: Multidetector CT imaging was performed according to the standard
protocol. Multiplanar CT image reconstructions were also generated.

[Series 2: wrist/hand bone · axial · 0.32mm/px · z∈[-29,+56]mm · 3 of 70 slices shown, 4 images]
[im 18/70  soft-tissue]
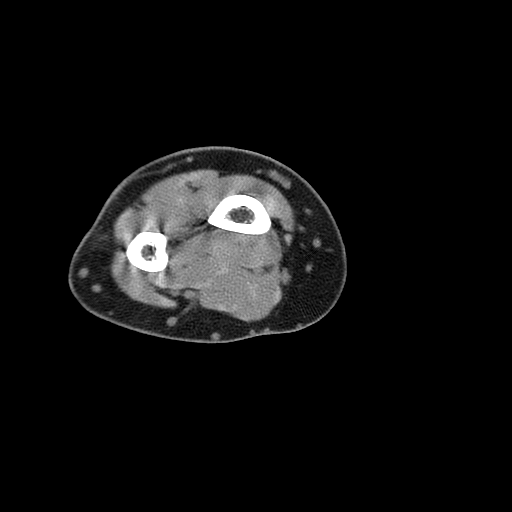
[im 18/70  bone]
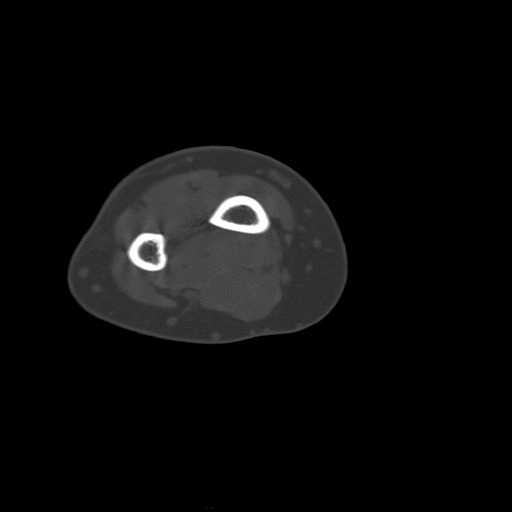
[im 35/70  bone]
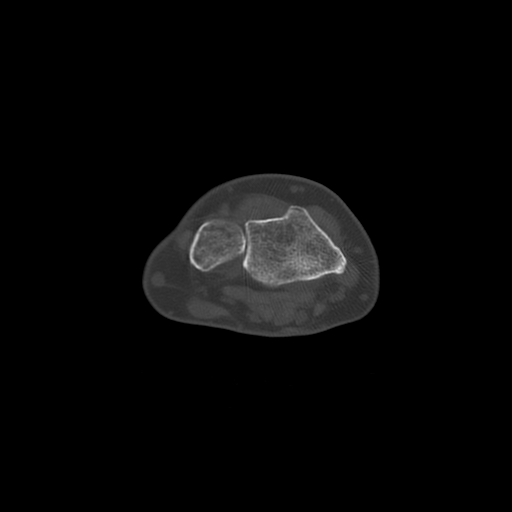
[im 52/70  bone]
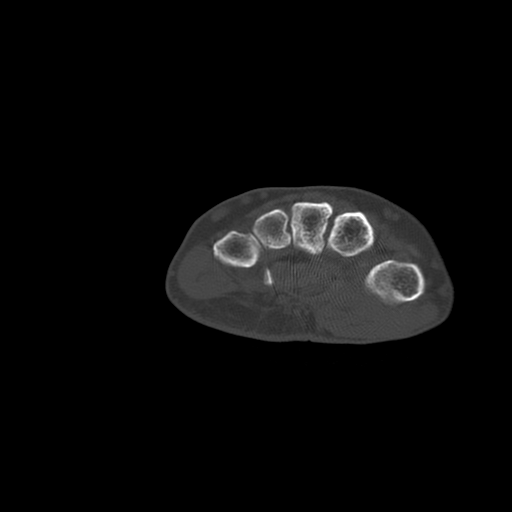

[Series 3: wrist/hand detail · axial · 0.32mm/px · z∈[-14,+44]mm · 2 of 70 slices shown]
[im 24/70  bone]
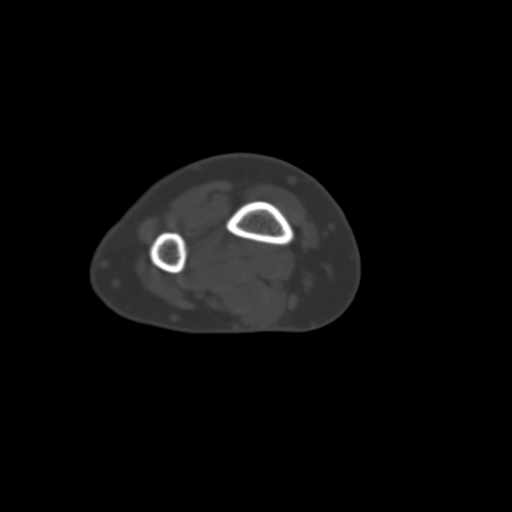
[im 47/70  bone]
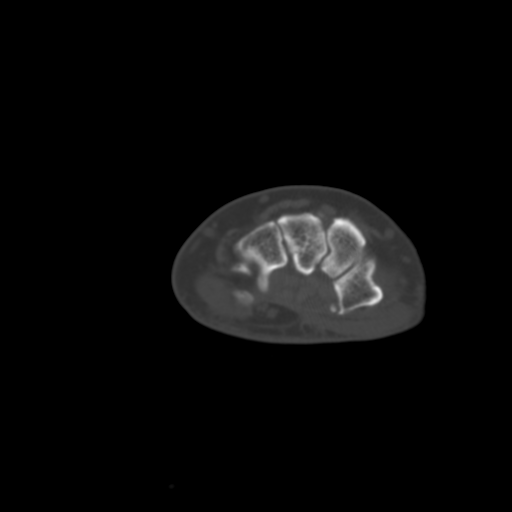

[5 of 14 positions shown; findings below may reference images not displayed]

FINDINGS: No definite fracture or dislocation is seen currently. Slight
cortical regularity of the scaphoid bone is noted which may
represent old healed fracture. No persistent fracture line remains.
Joint spaces appear to be intact. No soft tissue abnormality is
noted.
IMPRESSION: Slight cortical regularity is seen involving the lateral aspect of
the scaphoid consistent with old healed fracture. No persistent
fracture line or evidence of nonunion is noted. No acute fracture or
dislocation is noted.

## 2016-06-26 NOTE — Telephone Encounter (Signed)
PCP order another urine culture due to pt experiencing sx: Culture came back negative for infection. Lvm for pt to call back. Pt called back and a new note was started.   Labs tab has PCP message.   Called pt and gave results of last culture. Pt stated that she wanted the A1c results.

## 2016-06-26 NOTE — Telephone Encounter (Signed)
See previous phone note.  

## 2016-08-25 LAB — HM MAMMOGRAPHY

## 2016-08-28 ENCOUNTER — Encounter: Payer: Self-pay | Admitting: Internal Medicine

## 2016-08-28 NOTE — Progress Notes (Signed)
Mammogram result - No evidence of malignancy.

## 2016-09-14 ENCOUNTER — Other Ambulatory Visit: Payer: Self-pay | Admitting: Internal Medicine

## 2016-09-28 LAB — HM DIABETES EYE EXAM

## 2016-09-29 ENCOUNTER — Encounter: Payer: Self-pay | Admitting: Internal Medicine

## 2016-09-29 NOTE — Progress Notes (Signed)
DM eye exam received. Results abstracted and copy sent to scan.

## 2016-10-09 ENCOUNTER — Telehealth: Payer: Self-pay | Admitting: Internal Medicine

## 2016-10-09 NOTE — Telephone Encounter (Signed)
[facility] - Client TELEPHONE ADVICE RECORD [facility]  Patient Name: Brianna Lambert Arrambide  DOB: 1956-05-18    Initial Comment Caller states she is having a earache and sinus issues.   Nurse Assessment  Nurse: Laural BenesJohnson, RN, Dondra SpryGail Date/Time Lamount Cohen(Eastern Time): 10/09/2016 3:59:01 PM  Confirm and document reason for call. If symptomatic, describe symptoms. You must click the next button to save text entered. ---[name] is having ear ache and sinus pain issues x 2 days. no fever but has felt warm to touch  Does the patient have any new or worsening symptoms? ---Yes  Will a triage be completed? ---Yes  Related visit to physician within the last 2 weeks? ---No  Does the PT have any chronic conditions? (i.e. diabetes, asthma, etc.) ---No  Is this a behavioral health or substance abuse call? ---No     Guidelines    Guideline Title Affirmed Question Affirmed Notes  Earache Earache (Exceptions: brief ear pain of < 60 minutes duration, earache occurring during air travel    Final Disposition User   See Physician within 24 Hours [place]okenJohnson, RN, Dondra SpryGail    Comments  NOTE has asthma -- has one side for earache post nasal drip and headache is requesting a medication be called in instead of making appt at this time. can you ask Dr. Yetta BarreJones if he will review this record and call something in if so can you notify patient.   Referrals  GO TO FACILITY UNDECIDED   Disagree/Comply: Comply

## 2016-11-14 ENCOUNTER — Other Ambulatory Visit: Payer: Self-pay | Admitting: Internal Medicine

## 2016-12-07 ENCOUNTER — Other Ambulatory Visit: Payer: Self-pay | Admitting: Internal Medicine

## 2016-12-26 ENCOUNTER — Other Ambulatory Visit (INDEPENDENT_AMBULATORY_CARE_PROVIDER_SITE_OTHER): Payer: 59

## 2016-12-26 ENCOUNTER — Ambulatory Visit (INDEPENDENT_AMBULATORY_CARE_PROVIDER_SITE_OTHER): Payer: 59 | Admitting: Internal Medicine

## 2016-12-26 VITALS — BP 122/72 | HR 63 | Temp 97.7°F | Resp 16 | Ht 67.0 in | Wt 266.0 lb

## 2016-12-26 DIAGNOSIS — I10 Essential (primary) hypertension: Secondary | ICD-10-CM

## 2016-12-26 DIAGNOSIS — J0100 Acute maxillary sinusitis, unspecified: Secondary | ICD-10-CM | POA: Insufficient documentation

## 2016-12-26 DIAGNOSIS — E118 Type 2 diabetes mellitus with unspecified complications: Secondary | ICD-10-CM

## 2016-12-26 LAB — COMPREHENSIVE METABOLIC PANEL
ALT: 14 U/L (ref 0–35)
AST: 15 U/L (ref 0–37)
Albumin: 3.9 g/dL (ref 3.5–5.2)
Alkaline Phosphatase: 44 U/L (ref 39–117)
BUN: 13 mg/dL (ref 6–23)
CO2: 27 meq/L (ref 19–32)
Calcium: 9.2 mg/dL (ref 8.4–10.5)
Chloride: 103 mEq/L (ref 96–112)
Creatinine, Ser: 0.83 mg/dL (ref 0.40–1.20)
GFR: 74.16 mL/min (ref >60.00)
GLUCOSE: 108 mg/dL — AB (ref 70–99)
POTASSIUM: 4 meq/L (ref 3.5–5.1)
SODIUM: 136 meq/L (ref 135–145)
Total Bilirubin: 0.4 mg/dL (ref 0.2–1.2)
Total Protein: 7.2 g/dL (ref 6.0–8.3)

## 2016-12-26 LAB — URINALYSIS, ROUTINE W REFLEX MICROSCOPIC
BILIRUBIN URINE: NEGATIVE
Ketones, ur: NEGATIVE
LEUKOCYTES UA: NEGATIVE
Nitrite: NEGATIVE
PH: 5.5 (ref 5.0–8.0)
RBC / HPF: NONE SEEN (ref 0–?)
Total Protein, Urine: NEGATIVE
Urine Glucose: NEGATIVE
Urobilinogen, UA: 0.2 (ref 0.0–1.0)

## 2016-12-26 LAB — CBC WITH DIFFERENTIAL/PLATELET
BASOS PCT: 0.6 % (ref 0.0–3.0)
Basophils Absolute: 0 10*3/uL (ref 0.0–0.1)
EOS PCT: 1.8 % (ref 0.0–5.0)
Eosinophils Absolute: 0.2 10*3/uL (ref 0.0–0.7)
HCT: 42.1 % (ref 36.0–46.0)
Hemoglobin: 14.2 g/dL (ref 12.0–15.0)
LYMPHS ABS: 3.1 10*3/uL (ref 0.7–4.0)
Lymphocytes Relative: 34.4 % (ref 12.0–46.0)
MCHC: 33.7 g/dL (ref 30.0–36.0)
MCV: 89.5 fl (ref 78.0–100.0)
MONOS PCT: 8.4 % (ref 3.0–12.0)
Monocytes Absolute: 0.7 10*3/uL (ref 0.1–1.0)
NEUTROS ABS: 4.9 10*3/uL (ref 1.4–7.7)
NEUTROS PCT: 54.8 % (ref 43.0–77.0)
PLATELETS: 283 10*3/uL (ref 150.0–400.0)
RBC: 4.7 Mil/uL (ref 3.87–5.11)
RDW: 13.1 % (ref 11.5–15.5)
WBC: 8.9 10*3/uL (ref 4.0–10.5)

## 2016-12-26 LAB — HEMOGLOBIN A1C: Hgb A1c MFr Bld: 6.4 % (ref 4.6–6.5)

## 2016-12-26 MED ORDER — CEFDINIR 300 MG PO CAPS: 300.0000 mg | ORAL_CAPSULE | Freq: Two times a day (BID) | ORAL | 0 refills | Status: AC

## 2016-12-26 NOTE — Progress Notes (Signed)
Pre visit review using our clinic review tool, if applicable. No additional management support is needed unless otherwise documented below in the visit note. 

## 2016-12-26 NOTE — Patient Instructions (Signed)

## 2016-12-26 NOTE — Progress Notes (Signed)
Subjective:  Patient ID: Brianna Lambert, female    DOB: December 01, 1955  Age: 61 y.o. MRN: [id number]  CC: Hypertension; Sinusitis; and Diabetes   HPI Brianna Lambert presents for follow-up on hypertension and diabetes. She complains of a one-week history of facial pain, runny nose with yellow-green phlegm, postnasal drip, and chills. She denies earaches, cough, lymphadenopathy, rash, or fever.  She tells me her blood pressure and her blood sugar have been well controlled even though she complains of weight gain.  Outpatient Medications Prior to Visit  Medication Sig Dispense Refill  . aspirin EC 81 MG tablet Take 1 tablet (81 mg total) by mouth daily. 90 tablet 3  . cetirizine (ZYRTEC) 10 MG tablet Take 10 mg by mouth daily.      Marland Kitchen. ezetimibe (ZETIA) 10 MG tablet Take 1 tablet (10 mg total) by mouth daily. 90 tablet 3  . metFORMIN (GLUCOPHAGE-XR) 500 MG 24 hr tablet TAKE ONE TABLET BY MOUTH TWICE A DAY 60 tablet 0  . montelukast (SINGULAIR) 10 MG tablet Take 1 tablet (10 mg total) by mouth at bedtime. 90 tablet 3  . olmesartan (BENICAR) 40 MG tablet TAKE 1 TABLET BY MOUTH  DAILY 90 tablet 2  . Pitavastatin Calcium (LIVALO) 4 MG TABS Take 1 tablet (4 mg total) by mouth daily. 90 tablet 3  . ranitidine (ZANTAC) 150 MG tablet Take 150 mg by mouth 2 (two) times daily.    . TRULICITY 1.5 MG/0.5ML SOPN INJECT THE CONTENTS OF 1 PEN UNDER THE SKIN ONCE A WEEK 6 mL 3  . levalbuterol (XOPENEX HFA) 45 MCG/ACT inhaler Inhale 1-2 puffs into the lungs every 4 (four) hours as needed. Reported on 11/05/2015 (Patient not taking: Reported on 12/26/2016) 1 Inhaler 6  . Dulaglutide (TRULICITY) 1.5 MG/0.5ML SOPN Inject 1 Act into the skin once a week. 1 pen 0   No facility-administered medications prior to visit.     ROS Review of Systems  Constitutional: Positive for chills and unexpected weight change. Negative for fatigue and fever.  HENT: Positive for facial swelling, postnasal drip, rhinorrhea, sinus pain  and sinus pressure. Negative for congestion, sneezing, sore throat and trouble swallowing.   Eyes: Negative for visual disturbance.  Respiratory: Negative for chest tightness, shortness of breath and wheezing.   Cardiovascular: Negative for chest pain, palpitations and leg swelling.  Gastrointestinal: Negative for abdominal pain, constipation, diarrhea, nausea and vomiting.  Endocrine: Negative.  Negative for polydipsia, polyphagia and polyuria.  Genitourinary: Negative for decreased urine volume, difficulty urinating, dysuria, flank pain, frequency, hematuria and urgency.  Musculoskeletal: Negative.  Negative for back pain, myalgias and neck pain.  Skin: Negative.  Negative for color change and rash.  Allergic/Immunologic: Negative.   Neurological: Negative.  Negative for dizziness.  Hematological: Negative.  Negative for adenopathy. Does not bruise/bleed easily.  Psychiatric/Behavioral: Negative.     Objective:  BP 122/72 (BP Location: Left Arm, Patient Position: Sitting, Cuff Size: Large)   Pulse 63   Temp 97.7 F (36.5 C) (Oral)   Resp 16   Ht 5\' 7"  (1.702 m)   Wt 266 lb 0.6 oz (120.7 kg)   SpO2 98%   BMI 41.67 kg/m   BP Readings from Last 3 Encounters:  12/26/16 122/72  05/24/16 102/70  02/05/16 118/70    Wt Readings from Last 3 Encounters:  12/26/16 266 lb 0.6 oz (120.7 kg)  05/24/16 251 lb (113.9 kg)  02/05/16 264 lb 8 oz (120 kg)    Physical Exam  Constitutional: She is oriented to person, place, and time. No distress.  HENT:  Nose: Rhinorrhea present. No mucosal edema. Right sinus exhibits maxillary sinus tenderness. Right sinus exhibits no frontal sinus tenderness. Left sinus exhibits maxillary sinus tenderness. Left sinus exhibits no frontal sinus tenderness.  Mouth/Throat: Oropharynx is clear and moist and mucous membranes are normal. No oral lesions. No trismus in the jaw. No uvula swelling. No oropharyngeal exudate or posterior oropharyngeal erythema.  Eyes:  Conjunctivae are normal. Right eye exhibits no discharge. Left eye exhibits no discharge. No scleral icterus.  Neck: Normal range of motion. Neck supple. No JVD present. No tracheal deviation present. No thyromegaly present.  Cardiovascular: Normal rate, regular rhythm, normal heart sounds and intact distal pulses.  Exam reveals no gallop and no friction rub.   No murmur heard. Pulmonary/Chest: Effort normal and breath sounds normal. No stridor. No respiratory distress. She has no wheezes. She has no rales. She exhibits no tenderness.  Abdominal: Soft. Bowel sounds are normal. She exhibits no distension and no mass. There is no tenderness. There is no rebound and no guarding.  Musculoskeletal: Normal range of motion. She exhibits no edema, tenderness or deformity.  Lymphadenopathy:    She has no cervical adenopathy.  Neurological: She is oriented to person, place, and time.  Skin: Skin is warm and dry. No rash noted. She is not diaphoretic. No erythema. No pallor.  Vitals reviewed.   Lab Results  Component Value Date   WBC 8.9 12/26/2016   HGB 14.2 12/26/2016   HCT 42.1 12/26/2016   PLT 283.0 12/26/2016   GLUCOSE 108 (H) 12/26/2016   CHOL 129 05/24/2016   TRIG 102.0 05/24/2016   HDL 48.20 05/24/2016   LDLDIRECT 155.6 07/11/2012   LDLCALC 61 05/24/2016   ALT 14 12/26/2016   AST 15 12/26/2016   NA 136 12/26/2016   K 4.0 12/26/2016   CL 103 12/26/2016   CREATININE 0.83 12/26/2016   BUN 13 12/26/2016   CO2 27 12/26/2016   TSH 0.76 10/04/2015   INR 0.97 10/27/2011   HGBA1C 6.4 12/26/2016   MICROALBUR >0.7 10/04/2015    No results found.  Assessment & Plan:   Brianna Lambert was seen today for hypertension, sinusitis and diabetes.  Diagnoses and all orders for this visit:  Type 2 diabetes mellitus with complication, without long-term current use of insulin (HCC)- her A1c is at 6.4%, her blood sugars are adequately well controlled. Will continue the combination of a GLP-1 agonist and  metformin. -     Comprehensive metabolic panel; Future -     Hemoglobin A1c; Future -     CULTURE, URINE COMPREHENSIVE; Future  Essential hypertension, benign- her blood pressure is well-controlled, electrolytes and renal function are normal. -     Comprehensive metabolic panel; Future -     CBC with Differential/Platelet; Future -     Urinalysis, Routine w reflex microscopic; Future  Acute non-recurrent maxillary sinusitis- will treat the infection with Omnicef. -     cefdinir (OMNICEF) 300 MG capsule; Take 1 capsule (300 mg total) by mouth 2 (two) times daily.   I have discontinued Ms. Harkless's Dulaglutide. I am also having her start on cefdinir. Additionally, I am having her maintain her cetirizine, ranitidine, ezetimibe, Pitavastatin Calcium, aspirin EC, levalbuterol, montelukast, olmesartan, TRULICITY, and metFORMIN.  Meds ordered this encounter  Medications  . cefdinir (OMNICEF) 300 MG capsule    Sig: Take 1 capsule (300 mg total) by mouth 2 (two) times daily.  Dispense:  20 capsule    Refill:  0     Follow-up: Return in about 6 months (around 06/25/2017).  Sanda Linger, MD

## 2016-12-28 ENCOUNTER — Encounter: Payer: Self-pay | Admitting: Internal Medicine

## 2016-12-29 LAB — CULTURE, URINE COMPREHENSIVE

## 2017-01-05 ENCOUNTER — Telehealth: Payer: Self-pay | Admitting: Internal Medicine

## 2017-01-05 NOTE — Telephone Encounter (Signed)
Pt called stating that she is having pain across her back as of today. She said she received her test result:  *here is still a trace of Escherichia coli in your urine but I don't think this needs to be treated with an antibiotic unless you develop urinary symptoms. Please let me know if he develop any symptoms.*   She hopping to speak to someone about this today if possible. Please call her back.

## 2017-01-05 NOTE — Telephone Encounter (Signed)
Can you advise in PCP absence.  

## 2017-01-05 NOTE — Telephone Encounter (Signed)
Agree, bacteria in the urine without urinary symptoms such as burning with urination does not require treatment.

## 2017-01-06 ENCOUNTER — Other Ambulatory Visit: Payer: Self-pay | Admitting: Internal Medicine

## 2017-01-08 ENCOUNTER — Other Ambulatory Visit: Payer: Self-pay | Admitting: Internal Medicine

## 2017-01-08 DIAGNOSIS — N390 Urinary tract infection, site not specified: Principal | ICD-10-CM

## 2017-01-08 DIAGNOSIS — B9620 Unspecified Escherichia coli [E. coli] as the cause of diseases classified elsewhere: Secondary | ICD-10-CM

## 2017-01-08 MED ORDER — NITROFURANTOIN MACROCRYSTAL 100 MG PO CAPS: 100.0000 mg | ORAL_CAPSULE | Freq: Four times a day (QID) | ORAL | 0 refills | Status: AC

## 2017-01-08 NOTE — Telephone Encounter (Signed)
I have sent a prescription to treat this

## 2017-01-08 NOTE — Telephone Encounter (Signed)
Left detailed message for pt 

## 2017-01-08 NOTE — Telephone Encounter (Signed)
Pt is starting to have symptoms: pain while urinating.

## 2017-01-16 ENCOUNTER — Other Ambulatory Visit: Payer: Self-pay | Admitting: Internal Medicine

## 2017-01-16 DIAGNOSIS — E118 Type 2 diabetes mellitus with unspecified complications: Secondary | ICD-10-CM

## 2017-01-16 DIAGNOSIS — E785 Hyperlipidemia, unspecified: Secondary | ICD-10-CM

## 2017-02-13 ENCOUNTER — Other Ambulatory Visit: Payer: Self-pay | Admitting: Internal Medicine

## 2017-02-16 ENCOUNTER — Other Ambulatory Visit: Payer: Self-pay | Admitting: Internal Medicine

## 2017-04-16 ENCOUNTER — Other Ambulatory Visit: Payer: Self-pay | Admitting: Internal Medicine

## 2017-04-16 DIAGNOSIS — E118 Type 2 diabetes mellitus with unspecified complications: Secondary | ICD-10-CM

## 2017-05-02 ENCOUNTER — Other Ambulatory Visit: Payer: Self-pay | Admitting: Internal Medicine

## 2017-07-29 ENCOUNTER — Other Ambulatory Visit: Payer: Self-pay | Admitting: Internal Medicine

## 2017-08-02 ENCOUNTER — Ambulatory Visit (INDEPENDENT_AMBULATORY_CARE_PROVIDER_SITE_OTHER): Payer: 59 | Admitting: Internal Medicine

## 2017-08-02 ENCOUNTER — Other Ambulatory Visit (INDEPENDENT_AMBULATORY_CARE_PROVIDER_SITE_OTHER): Payer: 59

## 2017-08-02 ENCOUNTER — Encounter: Payer: Self-pay | Admitting: Internal Medicine

## 2017-08-02 VITALS — BP 110/70 | HR 65 | Temp 98.5°F | Resp 16 | Ht 67.0 in | Wt 280.0 lb

## 2017-08-02 DIAGNOSIS — E785 Hyperlipidemia, unspecified: Secondary | ICD-10-CM

## 2017-08-02 DIAGNOSIS — I10 Essential (primary) hypertension: Secondary | ICD-10-CM

## 2017-08-02 DIAGNOSIS — Z23 Encounter for immunization: Secondary | ICD-10-CM

## 2017-08-02 DIAGNOSIS — E118 Type 2 diabetes mellitus with unspecified complications: Secondary | ICD-10-CM

## 2017-08-02 LAB — BASIC METABOLIC PANEL
BUN: 13 mg/dL (ref 6–23)
CHLORIDE: 103 meq/L (ref 96–112)
CO2: 28 mEq/L (ref 19–32)
CREATININE: 0.87 mg/dL (ref 0.40–1.20)
Calcium: 9.2 mg/dL (ref 8.4–10.5)
GFR: 70.1 mL/min (ref >60.00)
GLUCOSE: 121 mg/dL — AB (ref 70–99)
POTASSIUM: 4.2 meq/L (ref 3.5–5.1)
Sodium: 137 mEq/L (ref 135–145)

## 2017-08-02 LAB — THYROID PANEL WITH TSH
Free Thyroxine Index: 2.5 (ref 1.4–3.8)
T3 UPTAKE: 27 % (ref 22–35)
T4, Total: 9.1 ug/dL (ref 5.1–11.9)
TSH: 0.61 mIU/L (ref 0.40–4.50)

## 2017-08-02 LAB — LIPID PANEL
CHOL/HDL RATIO: 2
Cholesterol: 127 mg/dL (ref 0–200)
HDL: 55.4 mg/dL (ref >39.00)
LDL Cholesterol: 51 mg/dL (ref 0–99)
NONHDL: 71.35
Triglycerides: 103 mg/dL (ref 0.0–149.0)
VLDL: 20.6 mg/dL (ref 0.0–40.0)

## 2017-08-02 LAB — HEMOGLOBIN A1C: HEMOGLOBIN A1C: 6.8 % — AB (ref 4.6–6.5)

## 2017-08-02 MED ORDER — ZOSTER VAC RECOMB ADJUVANTED 50 MCG/0.5ML IM SUSR: 0.5000 mL | Freq: Once | INTRAMUSCULAR | 1 refills | Status: AC

## 2017-08-02 MED ORDER — OLMESARTAN MEDOXOMIL 40 MG PO TABS: 40.0000 mg | ORAL_TABLET | Freq: Every day | ORAL | 1 refills | Status: DC

## 2017-08-02 MED ORDER — METFORMIN HCL ER 500 MG PO TB24: 500.0000 mg | ORAL_TABLET | Freq: Two times a day (BID) | ORAL | 1 refills | Status: DC

## 2017-08-02 NOTE — Patient Instructions (Signed)

## 2017-08-02 NOTE — Progress Notes (Signed)
Subjective:  Patient ID: Brianna Lambert, female    DOB: Feb 12, 1956  Age: 61 y.o. MRN: [id number]  CC: Hypertension; Hyperlipidemia; and Diabetes   HPI Brianna Lambert presents for f/up - she complains of wt gain but otherwise feels well.  Outpatient Medications Prior to Visit  Medication Sig Dispense Refill  . ASPIR-LOW 81 MG EC tablet TAKE 1 TABLET DAILY 90 tablet 3  . ezetimibe (ZETIA) 10 MG tablet TAKE 1 TABLET DAILY 90 tablet 3  . levalbuterol (XOPENEX HFA) 45 MCG/ACT inhaler Inhale 1-2 puffs into the lungs every 4 (four) hours as needed. Reported on 11/05/2015 1 Inhaler 6  . LIVALO 4 MG TABS TAKE 1 TABLET DAILY 90 tablet 3  . montelukast (SINGULAIR) 10 MG tablet TAKE 1 TABLET AT BEDTIME 90 tablet 3  . ranitidine (ZANTAC) 150 MG tablet Take 150 mg by mouth 2 (two) times daily.    . TRULICITY 1.5 MG/0.5ML SOPN INJECT THE CONTENTS OF 1 PEN UNDER THE SKIN ONCE A WEEK 6 mL 3  . metFORMIN (GLUCOPHAGE-XR) 500 MG 24 hr tablet TAKE ONE TABLET BY MOUTH TWICE A DAY 180 tablet 0  . olmesartan (BENICAR) 40 MG tablet TAKE 1 TABLET BY MOUTH  DAILY 90 tablet 1  . cetirizine (ZYRTEC) 10 MG tablet Take 10 mg by mouth daily.       No facility-administered medications prior to visit.     ROS Review of Systems  Constitutional: Positive for unexpected weight change. Negative for appetite change, chills, diaphoresis and fatigue.  HENT: Negative.   Eyes: Negative for visual disturbance.  Respiratory: Negative for cough, chest tightness, shortness of breath and wheezing.   Cardiovascular: Negative for chest pain, palpitations and leg swelling.  Gastrointestinal: Negative for abdominal pain, constipation, diarrhea, nausea and vomiting.  Endocrine: Negative.  Negative for polydipsia, polyphagia and polyuria.  Genitourinary: Negative.  Negative for difficulty urinating, dysuria, frequency, hematuria and urgency.  Musculoskeletal: Negative.  Negative for arthralgias and myalgias.  Skin: Negative.     Neurological: Negative.  Negative for dizziness and weakness.  Hematological: Negative for adenopathy. Does not bruise/bleed easily.  Psychiatric/Behavioral: Negative.     Objective:  BP 110/70 (BP Location: Left Arm, Patient Position: Sitting, Cuff Size: Normal)   Pulse 65   Temp 98.5 F (36.9 C) (Oral)   Resp 16   Ht  (1.702 m)   Wt 280 lb (127 kg)   SpO2 100%   BMI 43.85 kg/m   BP Readings from Last 3 Encounters:  08/02/17 110/70  12/26/16 122/72  05/24/16 102/70    Wt Readings from Last 3 Encounters:  08/02/17 280 lb (127 kg)  12/26/16 266 lb 0.6 oz (120.7 kg)  05/24/16 251 lb (113.9 kg)    Physical Exam  Constitutional: She is oriented to person, place, and time. No distress.  HENT:  Mouth/Throat: Oropharynx is clear and moist. No oropharyngeal exudate.  Eyes: Conjunctivae are normal. Right eye exhibits no discharge. Left eye exhibits no discharge. No scleral icterus.  Neck: Normal range of motion. Neck supple. No JVD present. No thyromegaly present.  Cardiovascular: Normal rate, regular rhythm and intact distal pulses.  Exam reveals no gallop and no friction rub.   No murmur heard. Pulmonary/Chest: Effort normal and breath sounds normal. No respiratory distress. She has no wheezes. She has no rales. She exhibits no tenderness.  Abdominal: Soft. Bowel sounds are normal. She exhibits no distension and no mass. There is no tenderness. There is no rebound and no guarding.  Musculoskeletal: Normal range of motion. She exhibits no edema, tenderness or deformity.  Lymphadenopathy:    She has no cervical adenopathy.  Neurological: She is alert and oriented to person, place, and time.  Skin: Skin is warm and dry. No rash noted. She is not diaphoretic. No erythema. No pallor.  Vitals reviewed.   Lab Results  Component Value Date   WBC 8.9 12/26/2016   HGB 14.2 12/26/2016   HCT 42.1 12/26/2016   PLT 283.0 12/26/2016   GLUCOSE 121 (H) 08/02/2017   CHOL 127  08/02/2017   TRIG 103.0 08/02/2017   HDL 55.40 08/02/2017   LDLDIRECT 155.6 07/11/2012   LDLCALC 51 08/02/2017   ALT 14 12/26/2016   AST 15 12/26/2016   NA 137 08/02/2017   K 4.2 08/02/2017   CL 103 08/02/2017   CREATININE 0.87 08/02/2017   BUN 13 08/02/2017   CO2 28 08/02/2017   TSH 0.61 08/02/2017   INR 0.97 10/27/2011   HGBA1C 6.8 (H) 08/02/2017   MICROALBUR >0.7 10/04/2015    No results found.  Assessment & Plan:   Brianna Lambert was seen today for hypertension, hyperlipidemia and diabetes.  Diagnoses and all orders for this visit:  Essential hypertension, benign- Her blood pressure is well-controlled. Electrolytes and renal function are normal. Labs to screen for secondary causes are normal. -     Basic metabolic panel; Future -     Thyroid Panel With TSH; Future  Type 2 diabetes mellitus with complication, without long-term current use of insulin (HCC)- her A1c is 6.8%. Her blood sugars are adequately well controlled. She was encouraged to improve her lifestyle modifications. -     Basic metabolic panel; Future -     Hemoglobin A1c; Future  Hyperlipidemia with target LDL less than 100- she has achieved her LDL goal and is doing well on the statin. -     Lipid panel; Future -     Thyroid Panel With TSH; Future  Need for pneumococcal vaccination -     Pneumococcal polysaccharide vaccine 23-valent greater than or equal to 2yo subcutaneous/IM  Other orders -     metFORMIN (GLUCOPHAGE-XR) 500 MG 24 hr tablet; Take 1 tablet (500 mg total) by mouth 2 (two) times daily. -     olmesartan (BENICAR) 40 MG tablet; Take 1 tablet (40 mg total) by mouth daily. -     Zoster Vac Recomb Adjuvanted [facility](SHINGRIX) injection; Inject 0.5 mLs into the muscle once.   I have discontinued Brianna Lambert's cetirizine. I have also changed her metFORMIN and olmesartan. Additionally, I am having her start on Zoster Vac Recomb Adjuvanted. Lastly, I am having her maintain her ranitidine, levalbuterol,  ASPIR-LOW, ezetimibe, LIVALO, montelukast, and TRULICITY.  Meds ordered this encounter  Medications  . metFORMIN (GLUCOPHAGE-XR) 500 MG 24 hr tablet    Sig: Take 1 tablet (500 mg total) by mouth 2 (two) times daily.    Dispense:  180 tablet    Refill:  1  . olmesartan (BENICAR) 40 MG tablet    Sig: Take 1 tablet (40 mg total) by mouth daily.    Dispense:  90 tablet    Refill:  1  . Zoster Vac Recomb Adjuvanted [facility](SHINGRIX) injection    Sig: Inject 0.5 mLs into the muscle once.    Dispense:  1 each    Refill:  1     Follow-up: Return in about 6 months (around 01/30/2018).  Sanda Lingerhomas Mollyann Halbert, MD

## 2017-08-03 ENCOUNTER — Encounter: Payer: Self-pay | Admitting: Internal Medicine

## 2017-12-24 ENCOUNTER — Other Ambulatory Visit: Payer: Self-pay | Admitting: Internal Medicine

## 2017-12-24 DIAGNOSIS — E118 Type 2 diabetes mellitus with unspecified complications: Secondary | ICD-10-CM

## 2017-12-24 DIAGNOSIS — E785 Hyperlipidemia, unspecified: Secondary | ICD-10-CM

## 2017-12-28 ENCOUNTER — Telehealth: Payer: Self-pay | Admitting: Internal Medicine

## 2017-12-28 NOTE — Telephone Encounter (Signed)
Livalo is not covered. Do you want to change to one of the alternatives? pravastatin tablets, simvastatin or atorvastatin.

## 2017-12-28 NOTE — Telephone Encounter (Signed)
Copied from CRM [phone]. Topic: Quick Communication - See Telephone Encounter >> Dec 28, 2017  9:54 AM Diana EvesHoyt, Maryann B wrote: CRM for notification. See Telephone encounter for:  Consuelo PandyLivalo is not covered by [facility] they have 4 other options that they can cover : pravastatin tablets, simvastatin, atorvastatin, or rosuvastatin. If the Livalo is required they need a PA completed. Reference # [id number] call back number [phone].  12/28/17.

## 2017-12-29 ENCOUNTER — Other Ambulatory Visit: Payer: Self-pay | Admitting: Internal Medicine

## 2017-12-29 DIAGNOSIS — E785 Hyperlipidemia, unspecified: Secondary | ICD-10-CM

## 2017-12-29 DIAGNOSIS — E118 Type 2 diabetes mellitus with unspecified complications: Secondary | ICD-10-CM

## 2017-12-29 MED ORDER — PRAVASTATIN SODIUM 40 MG PO TABS: 40.0000 mg | ORAL_TABLET | Freq: Every day | ORAL | 1 refills | Status: DC

## 2017-12-31 NOTE — Telephone Encounter (Signed)
[facility] is calling again, requesting call back

## 2017-12-31 NOTE — Telephone Encounter (Signed)
PCP sent in pravastatin

## 2018-01-01 NOTE — Telephone Encounter (Signed)
LVM for pt to call back at her convenience.   RE: PCP sent new rx of pravastatin to take the place of livalo (which was not covered by insurance).

## 2018-01-02 NOTE — Telephone Encounter (Signed)
Pt called and was made aware of med change. Pt understood and had no furthur questions. Pharmacy was confirmed.  Pt wasted us to know she received a letter form her insurance and her formulary for this year has changed and they will not be covering Trulicity this year, she is ok right now she has 2 months worth but wanted us to kow.

## 2018-01-18 DIAGNOSIS — H2513 Age-related nuclear cataract, bilateral: Secondary | ICD-10-CM | POA: Diagnosis not present

## 2018-01-18 DIAGNOSIS — E119 Type 2 diabetes mellitus without complications: Secondary | ICD-10-CM | POA: Diagnosis not present

## 2018-01-18 DIAGNOSIS — Z1231 Encounter for screening mammogram for malignant neoplasm of breast: Secondary | ICD-10-CM | POA: Diagnosis not present

## 2018-01-18 LAB — HM DIABETES EYE EXAM

## 2018-01-22 ENCOUNTER — Other Ambulatory Visit: Payer: Self-pay | Admitting: Internal Medicine

## 2018-01-29 ENCOUNTER — Other Ambulatory Visit: Payer: Self-pay | Admitting: Internal Medicine

## 2018-02-08 ENCOUNTER — Other Ambulatory Visit: Payer: Self-pay | Admitting: Internal Medicine

## 2018-03-30 ENCOUNTER — Other Ambulatory Visit: Payer: Self-pay | Admitting: Internal Medicine

## 2018-03-30 DIAGNOSIS — E118 Type 2 diabetes mellitus with unspecified complications: Secondary | ICD-10-CM

## 2018-05-09 ENCOUNTER — Other Ambulatory Visit: Payer: Self-pay | Admitting: Internal Medicine

## 2018-05-28 ENCOUNTER — Other Ambulatory Visit (INDEPENDENT_AMBULATORY_CARE_PROVIDER_SITE_OTHER): Payer: 59

## 2018-05-28 ENCOUNTER — Ambulatory Visit: Payer: 59 | Admitting: Internal Medicine

## 2018-05-28 ENCOUNTER — Encounter: Payer: Self-pay | Admitting: Internal Medicine

## 2018-05-28 VITALS — BP 144/82 | HR 57 | Temp 98.0°F | Resp 16 | Ht 67.0 in | Wt 283.0 lb

## 2018-05-28 DIAGNOSIS — R10813 Right lower quadrant abdominal tenderness: Secondary | ICD-10-CM

## 2018-05-28 DIAGNOSIS — I10 Essential (primary) hypertension: Secondary | ICD-10-CM

## 2018-05-28 DIAGNOSIS — E559 Vitamin D deficiency, unspecified: Secondary | ICD-10-CM

## 2018-05-28 DIAGNOSIS — R1031 Right lower quadrant pain: Secondary | ICD-10-CM

## 2018-05-28 DIAGNOSIS — E118 Type 2 diabetes mellitus with unspecified complications: Secondary | ICD-10-CM

## 2018-05-28 LAB — COMPREHENSIVE METABOLIC PANEL
ALK PHOS: 45 U/L (ref 39–117)
ALT: 17 U/L (ref 0–35)
AST: 17 U/L (ref 0–37)
Albumin: 3.8 g/dL (ref 3.5–5.2)
BUN: 12 mg/dL (ref 6–23)
CO2: 29 meq/L (ref 19–32)
Calcium: 9.1 mg/dL (ref 8.4–10.5)
Chloride: 103 mEq/L (ref 96–112)
Creatinine, Ser: 0.78 mg/dL (ref 0.40–1.20)
GFR: 79.31 mL/min (ref >60.00)
Glucose, Bld: 126 mg/dL — ABNORMAL HIGH (ref 70–99)
Potassium: 3.9 mEq/L (ref 3.5–5.1)
Sodium: 137 mEq/L (ref 135–145)
Total Bilirubin: 0.4 mg/dL (ref 0.2–1.2)
Total Protein: 6.8 g/dL (ref 6.0–8.3)

## 2018-05-28 LAB — URINALYSIS, ROUTINE W REFLEX MICROSCOPIC
Bilirubin Urine: NEGATIVE
HGB URINE DIPSTICK: NEGATIVE
KETONES UR: NEGATIVE
NITRITE: NEGATIVE
RBC / HPF: NONE SEEN (ref 0–?)
Specific Gravity, Urine: 1.01 (ref 1.000–1.030)
Total Protein, Urine: NEGATIVE
Urine Glucose: NEGATIVE
Urobilinogen, UA: 0.2 (ref 0.0–1.0)
pH: 6 (ref 5.0–8.0)

## 2018-05-28 LAB — HEMOGLOBIN A1C: HEMOGLOBIN A1C: 6.9 % — AB (ref 4.6–6.5)

## 2018-05-28 LAB — VITAMIN D 25 HYDROXY (VIT D DEFICIENCY, FRACTURES): VITD: 15.04 ng/mL — ABNORMAL LOW (ref 30.00–100.00)

## 2018-05-28 LAB — HM DIABETES FOOT EXAM

## 2018-05-28 LAB — LIPASE: LIPASE: 35 U/L (ref 11.0–59.0)

## 2018-05-28 LAB — MICROALBUMIN / CREATININE URINE RATIO
Creatinine,U: 38.3 mg/dL
Microalb Creat Ratio: 1.8 mg/g (ref 0.0–30.0)
Microalb, Ur: <0.7 mg/dL (ref 0.0–1.9)

## 2018-05-28 LAB — AMYLASE: AMYLASE: 21 U/L — AB (ref 27–131)

## 2018-05-28 MED ORDER — CHOLECALCIFEROL 1.25 MG (50000 UT) PO CAPS: 50000.0000 [IU] | ORAL_CAPSULE | ORAL | 1 refills | Status: DC

## 2018-05-28 NOTE — Progress Notes (Signed)
Subjective:  Patient ID: Brianna Lambert, female    DOB: Dec 15, 1955  Age: 62 y.o. MRN: [id number]  CC: Abdominal Pain and Diabetes   HPI Brianna Lambert presents for f/up - she complains of a one-month history of intermittent right flank and right lower quadrant abdominal pain.  She denies hematuria, dysuria, nausea, vomiting, diarrhea, constipation, or loss or of appetite.  Outpatient Medications Prior to Visit  Medication Sig Dispense Refill  . ASPIRIN LOW DOSE 81 MG EC tablet TAKE 1 TABLET DAILY 90 tablet 1  . levalbuterol (XOPENEX HFA) 45 MCG/ACT inhaler Inhale 1-2 puffs into the lungs every 4 (four) hours as needed. Reported on 11/05/2015 1 Inhaler 6  . metFORMIN (GLUCOPHAGE-XR) 500 MG 24 hr tablet TAKE ONE TABLET BY MOUTH TWICE A DAY 180 tablet 0  . montelukast (SINGULAIR) 10 MG tablet TAKE 1 TABLET AT BEDTIME 90 tablet 1  . olmesartan (BENICAR) 40 MG tablet TAKE 1 TABLET DAILY 90 tablet 1  . pravastatin (PRAVACHOL) 40 MG tablet Take 1 tablet (40 mg total) by mouth daily. 90 tablet 1  . ranitidine (ZANTAC) 150 MG tablet Take 150 mg by mouth 2 (two) times daily.    . TRULICITY 1.5 MG/0.5ML SOPN INJECT THE CONTENTS OF 1 PEN UNDER THE SKIN ONCE A WEEK 6 mL 1  . ezetimibe (ZETIA) 10 MG tablet TAKE 1 TABLET DAILY 90 tablet 1   No facility-administered medications prior to visit.     ROS Review of Systems  Constitutional: Positive for unexpected weight change (wt gain). Negative for chills, diaphoresis, fatigue and fever.  HENT: Negative.  Negative for trouble swallowing and voice change.   Eyes: Negative.   Respiratory: Negative.  Negative for cough, chest tightness and shortness of breath.   Cardiovascular: Negative for chest pain, palpitations and leg swelling.  Gastrointestinal: Positive for abdominal pain. Negative for constipation, diarrhea, nausea and vomiting.  Endocrine: Negative for polydipsia, polyphagia and polyuria.  Genitourinary: Positive for flank pain. Negative for  difficulty urinating, dysuria, frequency, urgency, vaginal bleeding, vaginal discharge and vaginal pain.  Musculoskeletal: Negative for arthralgias and myalgias.  Skin: Negative.  Negative for color change, pallor and rash.  Allergic/Immunologic: Negative.   Neurological: Negative.  Negative for dizziness, weakness and headaches.  Hematological: Negative for adenopathy. Does not bruise/bleed easily.    Objective:  BP (!) 144/82 (BP Location: Left Arm, Patient Position: Sitting, Cuff Size: Large)   Pulse (!) 57   Temp 98 F (36.7 C) (Oral)   Resp 16   Ht 5\' 7"  (1.702 m)   Wt 283 lb (128.4 kg)   SpO2 95%   BMI 44.32 kg/m   BP Readings from Last 3 Encounters:  05/28/18 (!) 144/82  08/02/17 110/70  12/26/16 122/72    Wt Readings from Last 3 Encounters:  05/28/18 283 lb (128.4 kg)  08/02/17 280 lb (127 kg)  12/26/16 266 lb 0.6 oz (120.7 kg)    Physical Exam  Constitutional: Vital signs are normal.  Non-toxic appearance. She does not appear ill. No distress.  HENT:  Mouth/Throat: No oropharyngeal exudate.  Eyes: No scleral icterus.  Neck: Normal range of motion. Neck supple. No JVD present. No thyromegaly present.  Cardiovascular: Normal rate, regular rhythm and normal heart sounds.  No murmur heard. Pulmonary/Chest: Effort normal and breath sounds normal. She has no wheezes. She has no rhonchi. She has no rales.  Abdominal: Soft. Normal appearance. Bowel sounds are decreased. There is no hepatosplenomegaly. There is tenderness in the right lower  quadrant. There is no rigidity, no guarding, no CVA tenderness, no tenderness at McBurney's point and negative Murphy's sign. No hernia.  Musculoskeletal: She exhibits no edema.  Lymphadenopathy:    She has no cervical adenopathy.  Skin: Skin is warm and dry. No pallor.  Vitals reviewed.   Lab Results  Component Value Date   WBC 8.9 12/26/2016   HGB 14.2 12/26/2016   HCT 42.1 12/26/2016   PLT 283.0 12/26/2016   GLUCOSE 126  (H) 05/28/2018   CHOL 127 08/02/2017   TRIG 103.0 08/02/2017   HDL 55.40 08/02/2017   LDLDIRECT 155.6 07/11/2012   LDLCALC 51 08/02/2017   ALT 17 05/28/2018   AST 17 05/28/2018   NA 137 05/28/2018   K 3.9 05/28/2018   CL 103 05/28/2018   CREATININE 0.78 05/28/2018   BUN 12 05/28/2018   CO2 29 05/28/2018   TSH 0.61 08/02/2017   INR 0.97 10/27/2011   HGBA1C 6.9 (H) 05/28/2018   MICROALBUR <0.7 05/28/2018    No results found.  Assessment & Plan:   Brianna Lambert was seen today for abdominal pain and diabetes.  Diagnoses and all orders for this visit:  Essential hypertension, benign-her blood pressure is not adequately well controlled.  I will treat the vitamin D deficiency.  The rest of her labs are negative for secondary causes or endorgan damage. -     Cancel: Basic metabolic panel; Future -     Urinalysis, Routine w reflex microscopic; Future -     VITAMIN D 25 Hydroxy (Vit-D Deficiency, Fractures); Future -     Comprehensive metabolic panel; Future  Type 2 diabetes mellitus with complication, without long-term current use of insulin (HCC)-her A1c is at 6.9%.  Her blood sugars are adequately well controlled. -     Cancel: Basic metabolic panel; Future -     Hemoglobin A1c; Future -     Microalbumin / creatinine urine ratio; Future -     Comprehensive metabolic panel; Future  Right lower quadrant abdominal tenderness without rebound tenderness- Her lab work is unremarkable for identifying any causes or of abdominal pain.  I have asked her to undergo a CT scan with contrast to see if there is an abscess, mass, ovarian cyst, renal lesion, or obstructing lesion. -     Lipase; Future -     Amylase; Future -     Comprehensive metabolic panel; Future -     CT Abdomen Pelvis W Contrast; Future  Vitamin D deficiency disease -     Cholecalciferol 50000 units capsule; Take 1 capsule (50,000 Units total) by mouth once a week.  Right lower quadrant abdominal pain -     CT Abdomen Pelvis W  Contrast; Future   I have discontinued Brianna Lambert's ezetimibe. I am also having her start on Cholecalciferol. Additionally, I am having her maintain her ranitidine, levalbuterol, ASPIRIN LOW DOSE, pravastatin, montelukast, olmesartan, TRULICITY, and metFORMIN.  Meds ordered this encounter  Medications  . Cholecalciferol 50000 units capsule    Sig: Take 1 capsule (50,000 Units total) by mouth once a week.    Dispense:  12 capsule    Refill:  1     Follow-up: Return in about 1 month (around 06/25/2018).  Sanda Lingerhomas Ladona Rosten, MD

## 2018-05-28 NOTE — Patient Instructions (Signed)

## 2018-05-30 ENCOUNTER — Telehealth: Payer: Self-pay | Admitting: Internal Medicine

## 2018-05-30 NOTE — Progress Notes (Signed)
Pt is scheduled for CT and she is aware

## 2018-05-30 NOTE — Telephone Encounter (Signed)
Pharmacy did not have the D3 so they dispensed the D2.   FYI unless I need to call them with your recommendation.

## 2018-05-30 NOTE — Telephone Encounter (Signed)
Copied from CRM [phone]. Topic: Quick Communication - See Telephone Encounter >> May 30, 2018 12:39 PM Tamela OddiMartin, [place], NT wrote: CRM for notification. See Telephone encounter for: 05/30/18. David calling from [employer] states that a RX was sent for Vitamin D3 and he states that that medication was not in stock . So he gave her the Vitamin D2. He just wanted to make the provider aware.

## 2018-06-07 ENCOUNTER — Ambulatory Visit (INDEPENDENT_AMBULATORY_CARE_PROVIDER_SITE_OTHER)
Admission: RE | Admit: 2018-06-07 | Discharge: 2018-06-07 | Disposition: A | Discharge status: Home / Self Care | Payer: 59 | Source: Ambulatory Visit | Attending: Internal Medicine | Admitting: Internal Medicine

## 2018-06-07 DIAGNOSIS — R10813 Right lower quadrant abdominal tenderness: Secondary | ICD-10-CM

## 2018-06-07 DIAGNOSIS — R1031 Right lower quadrant pain: Secondary | ICD-10-CM

## 2018-06-07 MED ORDER — IOPAMIDOL (ISOVUE-300) INJECTION 61%
100.0000 mL | Freq: Once | INTRAVENOUS | Status: AC | PRN
  Administered 2018-06-07: 100 mL via INTRAVENOUS

## 2018-06-22 ENCOUNTER — Other Ambulatory Visit: Payer: Self-pay | Admitting: Internal Medicine

## 2018-06-22 DIAGNOSIS — E118 Type 2 diabetes mellitus with unspecified complications: Secondary | ICD-10-CM

## 2018-06-30 ENCOUNTER — Other Ambulatory Visit: Payer: Self-pay | Admitting: Internal Medicine

## 2018-06-30 DIAGNOSIS — E785 Hyperlipidemia, unspecified: Secondary | ICD-10-CM

## 2018-06-30 DIAGNOSIS — E118 Type 2 diabetes mellitus with unspecified complications: Secondary | ICD-10-CM

## 2018-07-02 DIAGNOSIS — M7061 Trochanteric bursitis, right hip: Secondary | ICD-10-CM | POA: Diagnosis not present

## 2018-07-02 DIAGNOSIS — M533 Sacrococcygeal disorders, not elsewhere classified: Secondary | ICD-10-CM | POA: Diagnosis not present

## 2018-07-09 ENCOUNTER — Other Ambulatory Visit: Payer: Self-pay | Admitting: Internal Medicine

## 2018-07-09 DIAGNOSIS — R1011 Right upper quadrant pain: Secondary | ICD-10-CM

## 2018-07-09 DIAGNOSIS — R10813 Right lower quadrant abdominal tenderness: Secondary | ICD-10-CM

## 2018-07-21 ENCOUNTER — Other Ambulatory Visit: Payer: Self-pay | Admitting: Internal Medicine

## 2018-07-28 ENCOUNTER — Other Ambulatory Visit: Payer: Self-pay | Admitting: Internal Medicine

## 2018-08-02 ENCOUNTER — Ambulatory Visit
Admission: RE | Admit: 2018-08-02 | Discharge: 2018-08-02 | Disposition: A | Discharge status: Home / Self Care | Payer: 59 | Source: Ambulatory Visit | Attending: Internal Medicine | Admitting: Internal Medicine

## 2018-08-02 DIAGNOSIS — R935 Abnormal findings on diagnostic imaging of other abdominal regions, including retroperitoneum: Secondary | ICD-10-CM | POA: Diagnosis not present

## 2018-08-02 DIAGNOSIS — R109 Unspecified abdominal pain: Secondary | ICD-10-CM | POA: Diagnosis not present

## 2018-08-02 DIAGNOSIS — R1011 Right upper quadrant pain: Secondary | ICD-10-CM

## 2018-08-02 DIAGNOSIS — R10813 Right lower quadrant abdominal tenderness: Secondary | ICD-10-CM

## 2018-08-02 MED ORDER — GADOBENATE DIMEGLUMINE 529 MG/ML IV SOLN
20.0000 mL | Freq: Once | INTRAVENOUS | Status: AC | PRN
  Administered 2018-08-02: 20 mL via INTRAVENOUS

## 2018-08-08 ENCOUNTER — Other Ambulatory Visit: Payer: Self-pay | Admitting: Internal Medicine

## 2018-09-14 ENCOUNTER — Other Ambulatory Visit: Payer: Self-pay | Admitting: Internal Medicine

## 2018-09-14 DIAGNOSIS — E118 Type 2 diabetes mellitus with unspecified complications: Secondary | ICD-10-CM

## 2018-11-07 ENCOUNTER — Other Ambulatory Visit: Payer: Self-pay | Admitting: Internal Medicine

## 2018-12-08 ENCOUNTER — Other Ambulatory Visit: Payer: Self-pay | Admitting: Internal Medicine

## 2018-12-11 ENCOUNTER — Other Ambulatory Visit: Payer: Self-pay | Admitting: Internal Medicine

## 2018-12-11 MED ORDER — METFORMIN HCL ER 500 MG PO TB24: 500.0000 mg | ORAL_TABLET | Freq: Two times a day (BID) | ORAL | 0 refills | Status: DC

## 2018-12-11 NOTE — Telephone Encounter (Signed)
Requested medication (s) are due for refill today -yes  Requested medication (s) are on the active medication list -yes  Future visit scheduled -yes  Last refill: 11/07/18  Notes to clinic: patient has made appointment and is requesting enough medication to last to appointment- 01/02/19. Last request denied for no appointment- sent for review.  Requested Prescriptions  Pending Prescriptions Disp Refills   metFORMIN (GLUCOPHAGE-XR) 500 MG 24 hr tablet 60 tablet 0    Sig: Take 1 tablet (500 mg total) by mouth 2 (two) times daily.     Endocrinology:  Diabetes - Biguanides Failed - 12/11/2018 11:57 AM      Failed - HBA1C is between 0 and 7.9 and within 180 days    Hgb A1c MFr Bld  Date Value Ref Range Status  05/28/2018 6.9 (H) 4.6 - 6.5 % Final    Comment:    Glycemic Control Guidelines for People with Diabetes:Non Diabetic:  <6%Goal of Therapy: <7%Additional Action Suggested:  >8%          Failed - Valid encounter within last 6 months    Recent Outpatient Visits          6 months ago Essential hypertension, benign   [place], Thomas L, MD   1 year ago Essential hypertension, benign   [facility] -Mayer Camel, MD   1 year ago Type 2 diabetes mellitus with complication, without long-term current use of insulin ([place])   [place], Thomas L, MD   2 years ago Essential hypertension, benign   [place], Thomas L, MD   2 years ago Acute upper respiratory infection   [place], Tommi Rumps, NP      Future Appointments            In 3 weeks Janith Lima, MD [facility] [place], [place] in normal range and within 360 days    Creatinine, Ser  Date Value Ref Range Status  05/28/2018 0.78 0.40 - 1.20 mg/dL Final         Passed - eGFR in normal range and within 360 days    GFR calc Af  Amer  Date Value Ref Range Status  10/27/2011 >90 >90 mL/min Final    Comment:           The eGFR has been calculated using the CKD EPI equation. This calculation has not been validated in all clinical situations. eGFR's persistently <90 mL/min signify possible Chronic Kidney Disease.   GFR calc non Af Amer  Date Value Ref Range Status  10/27/2011 >90 >90 mL/min Final   GFR  Date Value Ref Range Status  05/28/2018 79.31 >60.00 mL/min Final          Requested Prescriptions  Pending Prescriptions Disp Refills   metFORMIN (GLUCOPHAGE-XR) 500 MG 24 hr tablet 60 tablet 0    Sig: Take 1 tablet (500 mg total) by mouth 2 (two) times daily.     Endocrinology:  Diabetes - Biguanides Failed - 12/11/2018 11:57 AM      Failed - HBA1C is between 0 and 7.9 and within 180 days    Hgb A1c MFr Bld  Date Value Ref Range Status  05/28/2018 6.9 (H) 4.6 - 6.5 % Final    Comment:    Glycemic Control Guidelines for People with Diabetes:Non  Diabetic:  <6%Goal of Therapy: <7%Additional Action Suggested:  >8%          Failed - Valid encounter within last 6 months    Recent Outpatient Visits          6 months ago Essential hypertension, benign   [place], Thomas L, MD   1 year ago Essential hypertension, benign   [facility] -Mayer Camel, MD   1 year ago Type 2 diabetes mellitus with complication, without long-term current use of insulin ([place])   [place], Thomas L, MD   2 years ago Essential hypertension, benign   [place], Thomas L, MD   2 years ago Acute upper respiratory infection   [place], Tommi Rumps, NP      Future Appointments            In 3 weeks Janith Lima, MD [facility] [place], [place] in normal range and within 360 days    Creatinine, Ser  Date Value Ref Range  Status  05/28/2018 0.78 0.40 - 1.20 mg/dL Final         Passed - eGFR in normal range and within 360 days    GFR calc Af Amer  Date Value Ref Range Status  10/27/2011 >90 >90 mL/min Final    Comment:           The eGFR has been calculated using the CKD EPI equation. This calculation has not been validated in all clinical situations. eGFR's persistently <90 mL/min signify possible Chronic Kidney Disease.   GFR calc non Af Amer  Date Value Ref Range Status  10/27/2011 >90 >90 mL/min Final   GFR  Date Value Ref Range Status  05/28/2018 79.31 >60.00 mL/min Final

## 2018-12-11 NOTE — Telephone Encounter (Signed)
Copied from CRM [id number]. Topic: Quick Communication - Rx Refill/Question >> Dec 11, 2018 11:49 AM Angela Nevin wrote: Medication: metFORMIN (GLUCOPHAGE-XR) 500 MG 24 hr tablet   Patient is requesting a partial refill of this medication until med refill appointment 02/13.   Has the patient contacted their pharmacy? Yes, advised to contact office.  Preferred Pharmacy (with phone number or street name):[facility] [address], [place] - [address] [phone] (Phone) [phone] (Fax)

## 2018-12-11 NOTE — Telephone Encounter (Signed)
Per office policy sent 30 day to local pharmacy until appt.../lmb  

## 2018-12-20 ENCOUNTER — Other Ambulatory Visit: Payer: Self-pay | Admitting: Internal Medicine

## 2018-12-20 DIAGNOSIS — E118 Type 2 diabetes mellitus with unspecified complications: Secondary | ICD-10-CM

## 2019-01-02 ENCOUNTER — Encounter: Payer: Self-pay | Admitting: Internal Medicine

## 2019-01-02 ENCOUNTER — Other Ambulatory Visit (INDEPENDENT_AMBULATORY_CARE_PROVIDER_SITE_OTHER): Payer: Managed Care, Other (non HMO)

## 2019-01-02 ENCOUNTER — Ambulatory Visit: Payer: Managed Care, Other (non HMO) | Admitting: Internal Medicine

## 2019-01-02 VITALS — BP 142/82 | HR 78 | Temp 98.5°F | Ht 67.0 in | Wt 281.0 lb

## 2019-01-02 DIAGNOSIS — Z0000 Encounter for general adult medical examination without abnormal findings: Secondary | ICD-10-CM

## 2019-01-02 DIAGNOSIS — I10 Essential (primary) hypertension: Secondary | ICD-10-CM

## 2019-01-02 DIAGNOSIS — E118 Type 2 diabetes mellitus with unspecified complications: Secondary | ICD-10-CM

## 2019-01-02 DIAGNOSIS — K219 Gastro-esophageal reflux disease without esophagitis: Secondary | ICD-10-CM | POA: Diagnosis not present

## 2019-01-02 DIAGNOSIS — E785 Hyperlipidemia, unspecified: Secondary | ICD-10-CM

## 2019-01-02 DIAGNOSIS — Z1231 Encounter for screening mammogram for malignant neoplasm of breast: Secondary | ICD-10-CM

## 2019-01-02 LAB — CBC WITH DIFFERENTIAL/PLATELET
BASOS ABS: 0.1 10*3/uL (ref 0.0–0.1)
BASOS PCT: 1.3 % (ref 0.0–3.0)
EOS PCT: 1.7 % (ref 0.0–5.0)
Eosinophils Absolute: 0.1 10*3/uL (ref 0.0–0.7)
HEMATOCRIT: 43 % (ref 36.0–46.0)
Hemoglobin: 14.6 g/dL (ref 12.0–15.0)
Lymphocytes Relative: 35.9 % (ref 12.0–46.0)
Lymphs Abs: 2.7 10*3/uL (ref 0.7–4.0)
MCHC: 33.9 g/dL (ref 30.0–36.0)
MCV: 89.2 fl (ref 78.0–100.0)
Monocytes Absolute: 0.7 10*3/uL (ref 0.1–1.0)
Monocytes Relative: 9.4 % (ref 3.0–12.0)
NEUTROS ABS: 3.9 10*3/uL (ref 1.4–7.7)
Neutrophils Relative %: 51.7 % (ref 43.0–77.0)
PLATELETS: 311 10*3/uL (ref 150.0–400.0)
RBC: 4.82 Mil/uL (ref 3.87–5.11)
RDW: 13.7 % (ref 11.5–15.5)
WBC: 7.5 10*3/uL (ref 4.0–10.5)

## 2019-01-02 LAB — CK: CK TOTAL: 87 U/L (ref 7–177)

## 2019-01-02 LAB — COMPREHENSIVE METABOLIC PANEL
ALT: 20 U/L (ref 0–35)
AST: 19 U/L (ref 0–37)
Albumin: 4 g/dL (ref 3.5–5.2)
Alkaline Phosphatase: 46 U/L (ref 39–117)
BUN: 12 mg/dL (ref 6–23)
CHLORIDE: 102 meq/L (ref 96–112)
CO2: 24 mEq/L (ref 19–32)
CREATININE: 0.85 mg/dL (ref 0.40–1.20)
Calcium: 9.2 mg/dL (ref 8.4–10.5)
GFR: 67.44 mL/min (ref >60.00)
GLUCOSE: 106 mg/dL — AB (ref 70–99)
Potassium: 4.1 mEq/L (ref 3.5–5.1)
SODIUM: 136 meq/L (ref 135–145)
Total Bilirubin: 0.5 mg/dL (ref 0.2–1.2)
Total Protein: 7 g/dL (ref 6.0–8.3)

## 2019-01-02 LAB — LIPID PANEL
CHOL/HDL RATIO: 5
Cholesterol: 219 mg/dL — ABNORMAL HIGH (ref 0–200)
HDL: 45.9 mg/dL (ref >39.00)
LDL Cholesterol: 149 mg/dL — ABNORMAL HIGH (ref 0–99)
NONHDL: 173.59
Triglycerides: 125 mg/dL (ref 0.0–149.0)
VLDL: 25 mg/dL (ref 0.0–40.0)

## 2019-01-02 LAB — HEMOGLOBIN A1C: HEMOGLOBIN A1C: 6.7 % — AB (ref 4.6–6.5)

## 2019-01-02 LAB — TSH: TSH: 0.86 u[IU]/mL (ref 0.35–4.50)

## 2019-01-02 MED ORDER — PITAVASTATIN CALCIUM 2 MG PO TABS: 1.0000 | ORAL_TABLET | Freq: Every day | ORAL | 1 refills | Status: DC

## 2019-01-02 MED ORDER — FAMOTIDINE 40 MG PO TABS: 40.0000 mg | ORAL_TABLET | Freq: Every day | ORAL | 1 refills | Status: DC

## 2019-01-02 NOTE — Patient Instructions (Signed)

## 2019-01-02 NOTE — Progress Notes (Signed)
Subjective:  Patient ID: Brianna Lambert, female    DOB: [birth year]  Age: 63 y.o. MRN: [id number]  CC: Hypertension; Hyperlipidemia; Diabetes; and Annual Exam   HPI Brianna BoJudy M Oxendine presents for a CPX.  She complains that pravastatin caused muscle and joint aches so she is stopped taking it.  She feels like her blood pressure and blood sugar have been well controlled.  She is active and denies any recent episodes of CP or DOE.  Since ranitidine has been recalled she wants to try a new H2 blocker for GERD.  Outpatient Medications Prior to Visit  Medication Sig Dispense Refill  . ASPIRIN LOW DOSE 81 MG EC tablet TAKE 1 TABLET DAILY 90 tablet 1  . Cholecalciferol 50000 units capsule Take 1 capsule (50,000 Units total) by mouth once a week. 12 capsule 1  . levalbuterol (XOPENEX HFA) 45 MCG/ACT inhaler Inhale 1-2 puffs into the lungs every 4 (four) hours as needed. Reported on 11/05/2015 1 Inhaler 6  . metFORMIN (GLUCOPHAGE-XR) 500 MG 24 hr tablet Take 1 tablet (500 mg total) by mouth 2 (two) times daily. Must keep scheduled appt for future refills 60 tablet 0  . montelukast (SINGULAIR) 10 MG tablet TAKE 1 TABLET AT BEDTIME 90 tablet 1  . olmesartan (BENICAR) 40 MG tablet TAKE 1 TABLET DAILY 90 tablet 1  . TRULICITY 1.5 MG/0.5ML SOPN INJECT THE CONTENTS OF 1 PEN UNDER THE SKIN ONCE A WEEK 6 mL 1  . pravastatin (PRAVACHOL) 40 MG tablet TAKE ONE TABLET BY MOUTH DAILY 90 tablet 1  . ranitidine (ZANTAC) 150 MG tablet Take 150 mg by mouth 2 (two) times daily.     No facility-administered medications prior to visit.     ROS Review of Systems  Constitutional: Negative for diaphoresis, fatigue and unexpected weight change.  HENT: Negative.  Negative for trouble swallowing and voice change.   Eyes: Negative for visual disturbance.  Respiratory: Negative for cough, chest tightness, shortness of breath and wheezing.   Cardiovascular: Negative for chest pain, palpitations and leg swelling.    Gastrointestinal: Negative for abdominal pain, constipation, diarrhea, nausea and vomiting.  Endocrine: Negative for polydipsia, polyphagia and polyuria.  Genitourinary: Negative.  Negative for difficulty urinating.  Musculoskeletal: Positive for arthralgias. Negative for myalgias.  Skin: Negative.  Negative for color change and pallor.  Neurological: Negative.  Negative for dizziness, weakness, light-headedness and headaches.  Hematological: Negative for adenopathy. Does not bruise/bleed easily.  Psychiatric/Behavioral: Negative.     Objective:  BP (!) 142/82 (BP Location: Left Arm, Patient Position: Sitting, Cuff Size: Large)   Pulse 78   Temp 98.5 F (36.9 C) (Oral)   Ht 5\' 7"  (1.702 m)   Wt 281 lb (127.5 kg)   SpO2 95%   BMI 44.01 kg/m   BP Readings from Last 3 Encounters:  01/02/19 (!) 142/82  05/28/18 (!) 144/82  08/02/17 110/70    Wt Readings from Last 3 Encounters:  01/02/19 281 lb (127.5 kg)  05/28/18 283 lb (128.4 kg)  08/02/17 280 lb (127 kg)    Physical Exam Vitals signs reviewed.  Constitutional:      Appearance: She is obese. She is not ill-appearing or diaphoretic.  HENT:     Nose: Nose normal. No congestion or rhinorrhea.     Mouth/Throat:     Mouth: Mucous membranes are moist.     Pharynx: Oropharynx is clear. No oropharyngeal exudate or posterior oropharyngeal erythema.  Eyes:     General: No scleral icterus.  Conjunctiva/sclera: Conjunctivae normal.  Neck:     Musculoskeletal: Normal range of motion. No neck rigidity or muscular tenderness.  Cardiovascular:     Rate and Rhythm: Normal rate and regular rhythm.     Pulses: Normal pulses.     Heart sounds: No murmur. No gallop.   Pulmonary:     Effort: Pulmonary effort is normal. No respiratory distress.     Breath sounds: No stridor. No wheezing, rhonchi or rales.  Abdominal:     General: Abdomen is flat. Bowel sounds are normal.     Palpations: There is no hepatomegaly, splenomegaly or  mass.     Tenderness: There is no abdominal tenderness.  Musculoskeletal:        General: No swelling.     Right lower leg: No edema.     Left lower leg: No edema.  Lymphadenopathy:     Cervical: No cervical adenopathy.  Skin:    General: Skin is warm and dry.  Neurological:     General: No focal deficit present.     Mental Status: She is oriented to person, place, and time. Mental status is at baseline.     Lab Results  Component Value Date   WBC 7.5 01/02/2019   HGB 14.6 01/02/2019   HCT 43.0 01/02/2019   PLT 311.0 01/02/2019   GLUCOSE 106 (H) 01/02/2019   CHOL 219 (H) 01/02/2019   TRIG 125.0 01/02/2019   HDL 45.90 01/02/2019   LDLDIRECT 155.6 07/11/2012   LDLCALC 149 (H) 01/02/2019   ALT 20 01/02/2019   AST 19 01/02/2019   NA 136 01/02/2019   K 4.1 01/02/2019   CL 102 01/02/2019   CREATININE 0.85 01/02/2019   BUN 12 01/02/2019   CO2 24 01/02/2019   TSH 0.86 01/02/2019   INR 0.97 10/27/2011   HGBA1C 6.7 (H) 01/02/2019   MICROALBUR <0.7 05/28/2018    Mr Abdomen Mrcp W Wo Contast  Result Date: 08/02/2018 CLINICAL DATA:  Right-sided abdominal pain and tenderness. Prior cholecystectomy. Creatinine was obtained on site at [facility] at [address] Results: Creatinine 0.8 mg/dL. EXAM: MRI ABDOMEN WITHOUT AND WITH CONTRAST (INCLUDING MRCP) TECHNIQUE: Multiplanar multisequence MR imaging of the abdomen was performed both before and after the administration of intravenous contrast. Heavily T2-weighted images of the biliary and pancreatic ducts were obtained, and three-dimensional MRCP images were rendered by post processing. CONTRAST:  80mL MULTIHANCE GADOBENATE DIMEGLUMINE 529 MG/ML IV SOLN COMPARISON:  CT on 06/07/2018 FINDINGS: Lower chest: No acute findings. Hepatobiliary: No hepatic masses identified. Prior cholecystectomy. No evidence of biliary ductal dilatation, with common bile duct measuring 6 mm. No evidence of choledocholithiasis. Pancreas: No mass or  inflammatory changes. No evidence of pancreatic ductal dilatation. Spleen:  Within normal limits in size and appearance. Adrenals/Urinary Tract: No masses identified. No evidence of hydronephrosis. Stomach/Bowel: Visualized portion unremarkable. Vascular/Lymphatic: No pathologically enlarged lymph nodes identified. No abdominal aortic aneurysm. Other:  None. Musculoskeletal:  No suspicious bone lesions identified. IMPRESSION: Prior cholecystectomy. No evidence of biliary ductal dilatation, choledocholithiasis, or other significant abnormality. Electronically Signed   By: Myles Rosenthal M.D.   On: 08/02/2018 13:58    Assessment & Plan:   Icis was seen today for hypertension, hyperlipidemia, diabetes and annual exam.  Diagnoses and all orders for this visit:  Essential hypertension, benign- Her blood pressure is not quite adequately well controlled.  I Have asked her to improve her lifestyle modifications. -     TSH; Future -  Comprehensive metabolic panel; Future -     CBC with Differential/Platelet; Future  Type II diabetes mellitus with manifestations- Her A1c is at 6.7%.  Her blood sugars are adequately well controlled.  She will continue to work on her lifestyle modifications and will continue the combination of metformin and a GLP-1 agonist. -     Hemoglobin A1c; Future  Hyperlipidemia with target LDL less than 100- She has not achieved her LDL goal and has an elevated CV risk.  She did not tolerate pravastatin so I have asked her to try pitavastatin. -     TSH; Future -     CK; Future -     Pitavastatin Calcium (LIVALO) 2 MG TABS; Take 1 tablet (2 mg total) by mouth daily.  Visit for screening mammogram -     MM DIGITAL SCREENING BILATERAL; Future  Routine general medical examination at a health care facility- Exam completed, labs reviewed, vaccines reviewed and updated, Pap and mammogram are up-to-date, she is referred for screening mammogram, patient education material was given. -      Lipid panel; Future -     HIV Antibody (routine testing w rflx); Future  Gastroesophageal reflux disease without esophagitis -     famotidine (PEPCID) 40 MG tablet; Take 1 tablet (40 mg total) by mouth daily.   I have discontinued Bonnita Levan. Penaloza's ranitidine and pravastatin. I am also having her start on famotidine and Pitavastatin Calcium. Additionally, I am having her maintain her levalbuterol, Cholecalciferol, montelukast, olmesartan, TRULICITY, metFORMIN, and ASPIRIN LOW DOSE.  Meds ordered this encounter  Medications  . famotidine (PEPCID) 40 MG tablet    Sig: Take 1 tablet (40 mg total) by mouth daily.    Dispense:  90 tablet    Refill:  1  . Pitavastatin Calcium (LIVALO) 2 MG TABS    Sig: Take 1 tablet (2 mg total) by mouth daily.    Dispense:  90 tablet    Refill:  1     Follow-up: Return in about 6 months (around 07/03/2019).  Sanda Linger, MD

## 2019-01-03 ENCOUNTER — Encounter: Payer: Self-pay | Admitting: Internal Medicine

## 2019-01-03 LAB — HIV ANTIBODY (ROUTINE TESTING W REFLEX): HIV: NONREACTIVE

## 2019-01-03 MED ORDER — DULAGLUTIDE 1.5 MG/0.5ML ~~LOC~~ SOAJ: SUBCUTANEOUS | 1 refills | Status: DC

## 2019-01-03 MED ORDER — METFORMIN HCL ER 500 MG PO TB24: 500.0000 mg | ORAL_TABLET | Freq: Two times a day (BID) | ORAL | 1 refills | Status: DC

## 2019-01-08 ENCOUNTER — Other Ambulatory Visit: Payer: Self-pay | Admitting: Internal Medicine

## 2019-01-08 DIAGNOSIS — E118 Type 2 diabetes mellitus with unspecified complications: Secondary | ICD-10-CM

## 2019-01-09 ENCOUNTER — Telehealth: Payer: Self-pay | Admitting: Internal Medicine

## 2019-01-09 NOTE — Telephone Encounter (Signed)
Copied from CRM [id number]. Topic: Quick Communication - Rx Refill/Question >> Jan 09, 2019  4:38 PM Donita Brooks wrote: Medication: Livalo  Has the patient contacted their pharmacy? Yes.    (Agent: If yes, when and what did the pharmacy advise?) [facility] stating that the medicine needs a pre auth.   Preferred Pharmacy (with phone number or street name): [facility] - [place], [place] - [address] [phone] (Phone) [phone] (Fax)    Agent: Please be advised that RX refills may take up to 3 business days. We ask that you follow-up with your pharmacy.

## 2019-01-10 ENCOUNTER — Other Ambulatory Visit: Payer: Self-pay | Admitting: Internal Medicine

## 2019-01-10 DIAGNOSIS — E785 Hyperlipidemia, unspecified: Secondary | ICD-10-CM

## 2019-01-10 MED ORDER — ATORVASTATIN CALCIUM 20 MG PO TABS: 20.0000 mg | ORAL_TABLET | Freq: Every day | ORAL | 1 refills | Status: DC

## 2019-01-10 NOTE — Telephone Encounter (Signed)
Victorino Dike, with [facility], calling back to check status of request. She states she will try calling back at 10.

## 2019-01-10 NOTE — Telephone Encounter (Signed)
PCP has changed the Livalo to an alternative medication.   Pt contacted and informed of same.

## 2019-01-11 ENCOUNTER — Ambulatory Visit: Payer: Managed Care, Other (non HMO) | Admitting: Family Medicine

## 2019-01-11 ENCOUNTER — Encounter: Payer: Self-pay | Admitting: Family Medicine

## 2019-01-11 VITALS — BP 122/78 | HR 72 | Temp 97.9°F | Ht 67.0 in | Wt 273.0 lb

## 2019-01-11 DIAGNOSIS — R059 Cough, unspecified: Secondary | ICD-10-CM | POA: Insufficient documentation

## 2019-01-11 DIAGNOSIS — R05 Cough: Secondary | ICD-10-CM | POA: Diagnosis not present

## 2019-01-11 MED ORDER — PREDNISONE 10 MG PO TABS: 10.0000 mg | ORAL_TABLET | Freq: Every day | ORAL | 0 refills | Status: DC

## 2019-01-11 MED ORDER — PREDNISONE 10 MG PO TABS: ORAL_TABLET | ORAL | 0 refills | Status: DC

## 2019-01-11 NOTE — Assessment & Plan Note (Signed)
She is well appearing.  D/w pt about options.  She didn't think she had the flu.   Even if she did have flu, she has a very reassuring presentation.  She was prev vaccinated and doesn't have severity of sx that necessitate tamiflu at this point.   She declined flu test and I agree with that.    She more likely has a nonflu viral illness, prevalent in community recently.   Rest and fluids.   Use SABA prn. Hold prednisone for now, routine cautions d/w pt.  She agrees with plan.  See avs.

## 2019-01-11 NOTE — Patient Instructions (Signed)
Likely a nonflu viral illness.  Rest and fluids.  Use xopenex if needed.  Hold prednisone for now.  Use if needed, if more wheeze. Update [place] as needed.  Take care.  Glad to see you.

## 2019-01-11 NOTE — Progress Notes (Signed)
Sx for 1 day. Has taken Advil. Sinus pressure, pressure/pain in ears. She had some shortness of breath, with some tightness in chest, dry cough. Temp 101 last night.    Family with sick contacts.   She rarely uses SABA, used only PRN.    Still with HA.  Rare cough.  She had a flu shot.    Meds, vitals, and allergies reviewed.   ROS: Per HPI unless specifically indicated in ROS section   GEN: nad, alert and oriented, well appearing HEENT: mucous membranes moist, tm w/o erythema, nasal exam w/o erythema, scant clear discharge noted,  OP wnl NECK: supple w/o LA CV: rrr.   PULM: ctab, no inc wob, no wheeze, no cough, speaking in complete sentences.  EXT: no edema SKIN: no acute rash, well perfused.

## 2019-01-15 ENCOUNTER — Other Ambulatory Visit: Payer: Self-pay

## 2019-01-15 DIAGNOSIS — E559 Vitamin D deficiency, unspecified: Secondary | ICD-10-CM

## 2019-01-15 MED ORDER — CHOLECALCIFEROL 1.25 MG (50000 UT) PO CAPS: 50000.0000 [IU] | ORAL_CAPSULE | ORAL | 0 refills | Status: DC

## 2019-01-15 NOTE — Telephone Encounter (Signed)
[facility] is requesting refill of Vitamin D 50,000 U.   Please advise if you want to continue or use a daily vit D.

## 2019-01-18 ENCOUNTER — Other Ambulatory Visit: Payer: Self-pay | Admitting: Internal Medicine

## 2019-01-25 ENCOUNTER — Other Ambulatory Visit: Payer: Self-pay | Admitting: Internal Medicine

## 2019-01-27 ENCOUNTER — Encounter: Payer: Self-pay | Admitting: Internal Medicine

## 2019-01-28 ENCOUNTER — Other Ambulatory Visit: Payer: Self-pay | Admitting: Internal Medicine

## 2019-01-30 ENCOUNTER — Other Ambulatory Visit: Payer: Self-pay | Admitting: Internal Medicine

## 2019-01-30 DIAGNOSIS — E559 Vitamin D deficiency, unspecified: Secondary | ICD-10-CM

## 2019-01-30 MED ORDER — CHOLECALCIFEROL 1.25 MG (50000 UT) PO CAPS: 50000.0000 [IU] | ORAL_CAPSULE | ORAL | 0 refills | Status: DC

## 2019-06-11 ENCOUNTER — Other Ambulatory Visit: Payer: Self-pay | Admitting: Internal Medicine

## 2019-06-11 DIAGNOSIS — E559 Vitamin D deficiency, unspecified: Secondary | ICD-10-CM

## 2019-06-13 ENCOUNTER — Other Ambulatory Visit: Payer: Self-pay | Admitting: Internal Medicine

## 2019-06-13 DIAGNOSIS — K219 Gastro-esophageal reflux disease without esophagitis: Secondary | ICD-10-CM

## 2019-06-18 ENCOUNTER — Other Ambulatory Visit: Payer: Self-pay | Admitting: Internal Medicine

## 2019-06-18 DIAGNOSIS — E118 Type 2 diabetes mellitus with unspecified complications: Secondary | ICD-10-CM

## 2019-06-21 ENCOUNTER — Other Ambulatory Visit: Payer: Self-pay | Admitting: Internal Medicine

## 2019-06-21 DIAGNOSIS — E785 Hyperlipidemia, unspecified: Secondary | ICD-10-CM

## 2019-07-15 ENCOUNTER — Other Ambulatory Visit: Payer: Self-pay | Admitting: Internal Medicine

## 2019-07-15 DIAGNOSIS — E559 Vitamin D deficiency, unspecified: Secondary | ICD-10-CM

## 2019-07-17 ENCOUNTER — Other Ambulatory Visit: Payer: Self-pay | Admitting: Internal Medicine

## 2019-07-24 ENCOUNTER — Other Ambulatory Visit: Payer: Self-pay | Admitting: Internal Medicine

## 2019-07-26 ENCOUNTER — Other Ambulatory Visit: Payer: Self-pay | Admitting: Internal Medicine

## 2019-07-26 DIAGNOSIS — E118 Type 2 diabetes mellitus with unspecified complications: Secondary | ICD-10-CM

## 2019-08-05 ENCOUNTER — Other Ambulatory Visit: Payer: Self-pay

## 2019-08-05 ENCOUNTER — Ambulatory Visit (INDEPENDENT_AMBULATORY_CARE_PROVIDER_SITE_OTHER): Payer: Managed Care, Other (non HMO) | Admitting: Internal Medicine

## 2019-08-05 ENCOUNTER — Telehealth: Payer: Self-pay | Admitting: Internal Medicine

## 2019-08-05 ENCOUNTER — Encounter: Payer: Self-pay | Admitting: Internal Medicine

## 2019-08-05 VITALS — BP 130/80 | HR 68 | Temp 98.1°F | Resp 16 | Ht 67.0 in | Wt 275.0 lb

## 2019-08-05 DIAGNOSIS — E118 Type 2 diabetes mellitus with unspecified complications: Secondary | ICD-10-CM | POA: Diagnosis not present

## 2019-08-05 DIAGNOSIS — E559 Vitamin D deficiency, unspecified: Secondary | ICD-10-CM

## 2019-08-05 DIAGNOSIS — L409 Psoriasis, unspecified: Secondary | ICD-10-CM

## 2019-08-05 DIAGNOSIS — I10 Essential (primary) hypertension: Secondary | ICD-10-CM

## 2019-08-05 DIAGNOSIS — E785 Hyperlipidemia, unspecified: Secondary | ICD-10-CM

## 2019-08-05 DIAGNOSIS — Z23 Encounter for immunization: Secondary | ICD-10-CM

## 2019-08-05 DIAGNOSIS — Z1231 Encounter for screening mammogram for malignant neoplasm of breast: Secondary | ICD-10-CM

## 2019-08-05 LAB — POCT GLYCOSYLATED HEMOGLOBIN (HGB A1C): Hemoglobin A1C: 6.7 % — AB (ref 4.0–5.6)

## 2019-08-05 MED ORDER — OZEMPIC (0.25 OR 0.5 MG/DOSE) 2 MG/1.5ML ~~LOC~~ SOPN: 0.5000 mg | PEN_INJECTOR | SUBCUTANEOUS | 1 refills | Status: DC

## 2019-08-05 MED ORDER — OLMESARTAN MEDOXOMIL 40 MG PO TABS: 40.0000 mg | ORAL_TABLET | Freq: Every day | ORAL | 1 refills | Status: DC

## 2019-08-05 MED ORDER — CLOBETASOL PROPIONATE 0.05 % EX FOAM: Freq: Two times a day (BID) | CUTANEOUS | 3 refills | Status: DC

## 2019-08-05 NOTE — Progress Notes (Addendum)
Subjective:  Patient ID: Brianna Lambert, female    DOB: [birth year]  Age: 63 y.o. MRN: [id number]  CC: Rash, Diabetes, and Hypertension   HPI Brianna Lambert presents for f/up - She complains of a several month history of itchy, flaky rash on her scalp.  She has not been treating it.  She complains of Trulicity is too expensive.  She has not been working on her lifestyle modifications but tells me her blood sugars have been relatively well controlled.  Outpatient Medications Prior to Visit  Medication Sig Dispense Refill   ASPIRIN LOW DOSE 81 MG EC tablet TAKE 1 TABLET DAILY 90 tablet 3   atorvastatin (LIPITOR) 20 MG tablet TAKE 1 TABLET DAILY 90 tablet 1   famotidine (PEPCID) 40 MG tablet TAKE 1 TABLET DAILY 90 tablet 1   levalbuterol (XOPENEX HFA) 45 MCG/ACT inhaler Inhale 1-2 puffs into the lungs every 4 (four) hours as needed. Reported on 11/05/2015 1 Inhaler 6   metFORMIN (GLUCOPHAGE-XR) 500 MG 24 hr tablet TAKE 1 TABLET TWICE A DAY (MUST KEEP SCHEDULED APPOINTMENT FOR FUTURE REFILLS) 180 tablet 0   montelukast (SINGULAIR) 10 MG tablet TAKE 1 TABLET AT BEDTIME 90 tablet 1   Cholecalciferol (VITAMIN D3) 1.25 MG (50000 UT) CAPS TAKE 1 CAPSULE ONCE WEEKLY 4 capsule 0   Dulaglutide (TRULICITY) 1.5 MG/0.5ML SOPN INJECT THE CONTENTS OF 1 PEN UNDER THE SKIN ONCE A WEEK 6 mL 1   olmesartan (BENICAR) 40 MG tablet TAKE 1 TABLET DAILY 90 tablet 1   No facility-administered medications prior to visit.     ROS Review of Systems  Constitutional: Positive for unexpected weight change (wt gain). Negative for diaphoresis and fatigue.  HENT: Negative.   Eyes: Negative.   Respiratory: Negative.  Negative for cough, chest tightness, shortness of breath and wheezing.   Cardiovascular: Negative for chest pain, palpitations and leg swelling.  Gastrointestinal: Negative for abdominal pain, constipation, diarrhea, nausea and vomiting.  Endocrine: Negative for polydipsia, polyphagia  and polyuria.  Genitourinary: Negative.  Negative for difficulty urinating.  Musculoskeletal: Negative for arthralgias and myalgias.  Skin: Positive for rash. Negative for color change and pallor.  Neurological: Negative for dizziness, weakness, light-headedness and headaches.  Hematological: Negative for adenopathy. Does not bruise/bleed easily.  Psychiatric/Behavioral: Negative.     Objective:  BP 130/80 (BP Location: Left Arm, Patient Position: Sitting, Cuff Size: Large)    Pulse 68    Temp 98.1 F (36.7 C) (Oral)    Resp 16    Ht 5\' 7"  (1.702 m)    Wt 275 lb (124.7 kg)    SpO2 96%    BMI 43.07 kg/m   BP Readings from Last 3 Encounters:  08/05/19 130/80  01/11/19 122/78  01/02/19 (!) 142/82    Wt Readings from Last 3 Encounters:  08/05/19 275 lb (124.7 kg)  01/11/19 273 lb (123.8 kg)  01/02/19 281 lb (127.5 kg)    Physical Exam Vitals signs reviewed.  Constitutional:      Appearance: She is obese. She is not ill-appearing.  HENT:     Nose: Nose normal.     Mouth/Throat:     Mouth: Mucous membranes are moist.  Eyes:     General: No scleral icterus.    Conjunctiva/sclera: Conjunctivae normal.  Neck:     Musculoskeletal: Normal range of motion and neck supple.  Cardiovascular:     Rate and Rhythm: Normal rate and regular rhythm.     Pulses: Normal  pulses.     Heart sounds: No murmur.  Pulmonary:     Effort: Pulmonary effort is normal.     Breath sounds: No stridor. No wheezing, rhonchi or rales.  Abdominal:     General: Abdomen is protuberant. Bowel sounds are normal. There is no distension.     Palpations: Abdomen is soft. There is no hepatomegaly or splenomegaly.     Tenderness: There is no abdominal tenderness.  Musculoskeletal: Normal range of motion.     Right lower leg: No edema.     Left lower leg: No edema.  Lymphadenopathy:     Cervical: No cervical adenopathy.  Skin:    General: Skin is warm and dry.     Comments: Scattered across her scalp there are  large patches of erythema with flaking and plaques.  See photos.  Neurological:     General: No focal deficit present.  Psychiatric:        Mood and Affect: Mood normal.        Behavior: Behavior normal.     Lab Results  Component Value Date   WBC 7.6 08/11/2019   HGB 14.3 08/11/2019   HCT 42.3 08/11/2019   PLT 318.0 08/11/2019   GLUCOSE 126 (H) 08/11/2019   CHOL 213 (H) 08/11/2019   TRIG 114.0 08/11/2019   HDL 41.20 08/11/2019   LDLDIRECT 155.6 07/11/2012   LDLCALC 149 (H) 08/11/2019   ALT 20 01/02/2019   AST 19 01/02/2019   NA 138 08/11/2019   K 4.0 08/11/2019   CL 103 08/11/2019   CREATININE 0.69 08/11/2019   BUN 14 08/11/2019   CO2 25 08/11/2019   TSH 0.50 08/11/2019   INR 0.97 10/27/2011   HGBA1C 6.7 (A) 08/05/2019   MICROALBUR <0.7 08/11/2019    Mr Abdomen Mrcp W Wo Contast  Result Date: 08/02/2018 CLINICAL DATA:  Right-sided abdominal pain and tenderness. Prior cholecystectomy. Creatinine was obtained on site at [facility] at [address] Results: Creatinine 0.8 mg/dL. EXAM: MRI ABDOMEN WITHOUT AND WITH CONTRAST (INCLUDING MRCP) TECHNIQUE: Multiplanar multisequence MR imaging of the abdomen was performed both before and after the administration of intravenous contrast. Heavily T2-weighted images of the biliary and pancreatic ducts were obtained, and three-dimensional MRCP images were rendered by post processing. CONTRAST:  4mL MULTIHANCE GADOBENATE DIMEGLUMINE 529 MG/ML IV SOLN COMPARISON:  CT on 06/07/2018 FINDINGS: Lower chest: No acute findings. Hepatobiliary: No hepatic masses identified. Prior cholecystectomy. No evidence of biliary ductal dilatation, with common bile duct measuring 6 mm. No evidence of choledocholithiasis. Pancreas: No mass or inflammatory changes. No evidence of pancreatic ductal dilatation. Spleen:  Within normal limits in size and appearance. Adrenals/Urinary Tract: No masses identified. No evidence of hydronephrosis.  Stomach/Bowel: Visualized portion unremarkable. Vascular/Lymphatic: No pathologically enlarged lymph nodes identified. No abdominal aortic aneurysm. Other:  None. Musculoskeletal:  No suspicious bone lesions identified. IMPRESSION: Prior cholecystectomy. No evidence of biliary ductal dilatation, choledocholithiasis, or other significant abnormality. Electronically Signed   By: Myles Rosenthal M.D.   On: 08/02/2018 13:58    Assessment & Plan:   Janeann was seen today for rash, diabetes and hypertension.  Diagnoses and all orders for this visit:  Essential hypertension, benign- Her blood pressure is adequately well controlled.  I will monitor her electrolytes and renal function. -     CBC with Differential/Platelet; Future -     Basic metabolic panel; Future -     Urinalysis, Routine w reflex microscopic; Future -     olmesartan (BENICAR)  40 MG tablet; Take 1 tablet (40 mg total) by mouth daily.  Type II diabetes mellitus with manifestations- Her A1c is at 6.7%.  She has achieved adequate glycemic control.  Will switch her to a less expensive GLP-1 agonist. -     Basic metabolic panel; Future -     Cancel: Hemoglobin A1c; Future -     Microalbumin / creatinine urine ratio; Future -     Ambulatory referral to Ophthalmology -     HM Diabetes Foot Exam -     POCT glycosylated hemoglobin (Hb A1C) -     Discontinue: Semaglutide,0.25 or 0.5MG /DOS, (OZEMPIC, 0.25 OR 0.5 MG/DOSE,) 2 MG/1.5ML SOPN; Inject 0.5 mg into the skin once a week. -     Semaglutide,0.25 or 0.5MG /DOS, (OZEMPIC, 0.25 OR 0.5 MG/DOSE,) 2 MG/1.5ML SOPN; Inject 0.5 mg into the skin once a week. -     olmesartan (BENICAR) 40 MG tablet; Take 1 tablet (40 mg total) by mouth daily.  Hyperlipidemia with target LDL less than 100- She is doing well on the statin.  I will monitor her lipid panel to see if she has achieved her LDL goal. -     Lipid panel; Future -     TSH; Future  Vitamin D deficiency disease- I will monitor her vitamin D  level. -     VITAMIN D 25 Hydroxy (Vit-D Deficiency, Fractures); Future  Visit for screening mammogram -     MM DIGITAL SCREENING BILATERAL; Future  Psoriasis of scalp -     clobetasol (OLUX) 0.05 % topical foam; Apply topically 2 (two) times daily.  Need for influenza vaccination -     Flu Vaccine QUAD 36+ mos IM   I have discontinued Othella Boyer. [place]'s Dulaglutide. I have also changed her olmesartan. Additionally, I am having her start on clobetasol. Lastly, I am having her maintain her levalbuterol, famotidine, metFORMIN, Aspirin Low Dose, atorvastatin, montelukast, and Ozempic (0.25 or 0.5 MG/DOSE).  Meds ordered this encounter  Medications   clobetasol (OLUX) 0.05 % topical foam    Sig: Apply topically 2 (two) times daily.    Dispense:  100 g    Refill:  3   DISCONTD: Semaglutide,0.25 or 0.5MG /DOS, (OZEMPIC, 0.25 OR 0.5 MG/DOSE,) 2 MG/1.5ML SOPN    Sig: Inject 0.5 mg into the skin once a week.    Dispense:  3 pen    Refill:  1   Semaglutide,0.25 or 0.5MG /DOS, (OZEMPIC, 0.25 OR 0.5 MG/DOSE,) 2 MG/1.5ML SOPN    Sig: Inject 0.5 mg into the skin once a week.    Dispense:  3 pen    Refill:  1   olmesartan (BENICAR) 40 MG tablet    Sig: Take 1 tablet (40 mg total) by mouth daily.    Dispense:  90 tablet    Refill:  1     Follow-up: Return in about 6 months (around 02/02/2020).  Scarlette Calico, MD

## 2019-08-05 NOTE — Telephone Encounter (Signed)
Pharmacy called in to request to have a different medication. Pt's insurance will not cover clobetasol (OLUX) 0.05 % topical foam.    Please assist.

## 2019-08-05 NOTE — Patient Instructions (Signed)
Type 2 Diabetes Mellitus, Diagnosis, Adult Type 2 diabetes (type 2 diabetes mellitus) is a long-term (chronic) disease. In type 2 diabetes, one or both of these problems may be present:  The pancreas does not make enough of a hormone called insulin.  Cells in the body do not respond properly to insulin that the body makes (insulin resistance). Normally, insulin allows blood sugar (glucose) to enter cells in the body. The cells use glucose for energy. Insulin resistance or lack of insulin causes excess glucose to build up in the blood instead of going into cells. As a result, high blood glucose (hyperglycemia) develops. What increases the risk? The following factors may make you more likely to develop type 2 diabetes:  Having a family member with type 2 diabetes.  Being overweight or obese.  Having an inactive (sedentary) lifestyle.  Having been diagnosed with insulin resistance.  Having a history of prediabetes, gestational diabetes, or polycystic ovary syndrome (PCOS).  Being of American-Indian, African-American, Hispanic/Latino, or Asian/Pacific Islander descent. What are the signs or symptoms? In the early stage of this condition, you may not have symptoms. Symptoms develop slowly and may include:  Increased thirst (polydipsia).  Increased hunger(polyphagia).  Increased urination (polyuria).  Increased urination during the night (nocturia).  Unexplained weight loss.  Frequent infections that keep coming back (recurring).  Fatigue.  Weakness.  Vision changes, such as blurry vision.  Cuts or bruises that are slow to heal.  Tingling or numbness in the hands or feet.  Dark patches on the skin (acanthosis nigricans). How is this diagnosed? This condition is diagnosed based on your symptoms, your medical history, a physical exam, and your blood glucose level. Your blood glucose may be checked with one or more of the following blood tests:  A fasting blood glucose (FBG)  test. You will not be allowed to eat (you will fast) for 8 hours or longer before a blood sample is taken.  A random blood glucose test. This test checks blood glucose at any time of day regardless of when you ate.  An A1c (hemoglobin A1c) blood test. This test provides information about blood glucose control over the previous 2-3 months.  An oral glucose tolerance test (OGTT). This test measures your blood glucose at two times: ? After fasting. This is your baseline blood glucose level. ? Two hours after drinking a beverage that contains glucose. You may be diagnosed with type 2 diabetes if:  Your FBG level is 126 mg/dL (7.0 mmol/L) or higher.  Your random blood glucose level is 200 mg/dL (11.1 mmol/L) or higher.  Your A1c level is 6.5% or higher.  Your OGTT result is higher than 200 mg/dL (11.1 mmol/L). These blood tests may be repeated to confirm your diagnosis. How is this treated? Your treatment may be managed by a specialist called an endocrinologist. Type 2 diabetes may be treated by following instructions from your health care provider about:  Making diet and lifestyle changes. This may include: ? Following an individualized nutrition plan that is developed by a diet and nutrition specialist (registered dietitian). ? Exercising regularly. ? Finding ways to manage stress.  Checking your blood glucose level as often as told.  Taking diabetes medicines or insulin daily. This helps to keep your blood glucose levels in the healthy range. ? If you use insulin, you may need to adjust the dosage depending on how physically active you are and what foods you eat. Your health care provider will tell you how to adjust your dosage.    Taking medicines to help prevent complications from diabetes, such as: ? Aspirin. ? Medicine to lower cholesterol. ? Medicine to control blood pressure. Your health care provider will set individualized treatment goals for you. Your goals will be based on  your age, other medical conditions you have, and how you respond to diabetes treatment. Generally, the goal of treatment is to maintain the following blood glucose levels:  Before meals (preprandial): 80-130 mg/dL (4.4-7.2 mmol/L).  After meals (postprandial): below 180 mg/dL (10 mmol/L).  A1c level: less than 7%. Follow these instructions at home: Questions to ask your health care provider  Consider asking the following questions: ? Do I need to meet with a diabetes educator? ? Where can I find a support group for people with diabetes? ? What equipment will I need to manage my diabetes at home? ? What diabetes medicines do I need, and when should I take them? ? How often do I need to check my blood glucose? ? What number can I call if I have questions? ? When is my next appointment? General instructions  Take over-the-counter and prescription medicines only as told by your health care provider.  Keep all follow-up visits as told by your health care provider. This is important.  For more information about diabetes, visit: ? American Diabetes Association (ADA): www.diabetes.org ? American Association of Diabetes Educators (AADE): www.diabeteseducator.org Contact a health care provider if:  Your blood glucose is at or above 240 mg/dL (13.3 mmol/L) for 2 days in a row.  You have been sick or have had a fever for 2 days or longer, and you are not getting better.  You have any of the following problems for more than 6 hours: ? You cannot eat or drink. ? You have nausea and vomiting. ? You have diarrhea. Get help right away if:  Your blood glucose is lower than 54 mg/dL (3.0 mmol/L).  You become confused or you have trouble thinking clearly.  You have difficulty breathing.  You have moderate or large ketone levels in your urine. Summary  Type 2 diabetes (type 2 diabetes mellitus) is a long-term (chronic) disease. In type 2 diabetes, the pancreas does not make enough of a  hormone called insulin, or cells in the body do not respond properly to insulin that the body makes (insulin resistance).  This condition is treated by making diet and lifestyle changes and taking diabetes medicines or insulin.  Your health care provider will set individualized treatment goals for you. Your goals will be based on your age, other medical conditions you have, and how you respond to diabetes treatment.  Keep all follow-up visits as told by your health care provider. This is important. This information is not intended to replace advice given to you by your health care provider. Make sure you discuss any questions you have with your health care provider. Document Released: 11/06/2005 Document Revised: 01/04/2018 Document Reviewed: 12/10/2015 Elsevier Patient Education  2020 [employer]vier Inc.  

## 2019-08-06 NOTE — Telephone Encounter (Signed)
Pt informed

## 2019-08-06 NOTE — Telephone Encounter (Signed)
Started PA

## 2019-08-06 NOTE — Telephone Encounter (Signed)
PA has been approved.  Can you call pt and inform of same?   Key: [id number]

## 2019-08-07 ENCOUNTER — Other Ambulatory Visit: Payer: Self-pay | Admitting: Internal Medicine

## 2019-08-07 DIAGNOSIS — E559 Vitamin D deficiency, unspecified: Secondary | ICD-10-CM

## 2019-08-11 ENCOUNTER — Encounter: Payer: Self-pay | Admitting: Internal Medicine

## 2019-08-11 ENCOUNTER — Other Ambulatory Visit (INDEPENDENT_AMBULATORY_CARE_PROVIDER_SITE_OTHER): Payer: Managed Care, Other (non HMO)

## 2019-08-11 DIAGNOSIS — E559 Vitamin D deficiency, unspecified: Secondary | ICD-10-CM

## 2019-08-11 DIAGNOSIS — E785 Hyperlipidemia, unspecified: Secondary | ICD-10-CM | POA: Diagnosis not present

## 2019-08-11 DIAGNOSIS — I10 Essential (primary) hypertension: Secondary | ICD-10-CM

## 2019-08-11 DIAGNOSIS — E118 Type 2 diabetes mellitus with unspecified complications: Secondary | ICD-10-CM | POA: Diagnosis not present

## 2019-08-11 LAB — CBC WITH DIFFERENTIAL/PLATELET
Basophils Absolute: 0.1 10*3/uL (ref 0.0–0.1)
Basophils Relative: 0.7 % (ref 0.0–3.0)
Eosinophils Absolute: 0.1 10*3/uL (ref 0.0–0.7)
Eosinophils Relative: 1.6 % (ref 0.0–5.0)
HCT: 42.3 % (ref 36.0–46.0)
Hemoglobin: 14.3 g/dL (ref 12.0–15.0)
Lymphocytes Relative: 31.1 % (ref 12.0–46.0)
Lymphs Abs: 2.4 10*3/uL (ref 0.7–4.0)
MCHC: 33.7 g/dL (ref 30.0–36.0)
MCV: 90.6 fl (ref 78.0–100.0)
Monocytes Absolute: 0.7 10*3/uL (ref 0.1–1.0)
Monocytes Relative: 8.8 % (ref 3.0–12.0)
Neutro Abs: 4.4 10*3/uL (ref 1.4–7.7)
Neutrophils Relative %: 57.8 % (ref 43.0–77.0)
Platelets: 318 10*3/uL (ref 150.0–400.0)
RBC: 4.67 Mil/uL (ref 3.87–5.11)
RDW: 13.6 % (ref 11.5–15.5)
WBC: 7.6 10*3/uL (ref 4.0–10.5)

## 2019-08-11 LAB — URINALYSIS, ROUTINE W REFLEX MICROSCOPIC
Bilirubin Urine: NEGATIVE
Hgb urine dipstick: NEGATIVE
Ketones, ur: NEGATIVE
Leukocytes,Ua: NEGATIVE
Nitrite: NEGATIVE
RBC / HPF: NONE SEEN (ref 0–?)
Specific Gravity, Urine: 1.025 (ref 1.000–1.030)
Total Protein, Urine: NEGATIVE
Urine Glucose: NEGATIVE
Urobilinogen, UA: 0.2 (ref 0.0–1.0)
pH: 6 (ref 5.0–8.0)

## 2019-08-11 LAB — BASIC METABOLIC PANEL
BUN: 14 mg/dL (ref 6–23)
CO2: 25 mEq/L (ref 19–32)
Calcium: 9.5 mg/dL (ref 8.4–10.5)
Chloride: 103 mEq/L (ref 96–112)
Creatinine, Ser: 0.69 mg/dL (ref 0.40–1.20)
GFR: 85.63 mL/min (ref >60.00)
Glucose, Bld: 126 mg/dL — ABNORMAL HIGH (ref 70–99)
Potassium: 4 mEq/L (ref 3.5–5.1)
Sodium: 138 mEq/L (ref 135–145)

## 2019-08-11 LAB — MICROALBUMIN / CREATININE URINE RATIO
Creatinine,U: 126.3 mg/dL
Microalb Creat Ratio: 0.6 mg/g (ref 0.0–30.0)
Microalb, Ur: <0.7 mg/dL (ref 0.0–1.9)

## 2019-08-11 LAB — LIPID PANEL
Cholesterol: 213 mg/dL — ABNORMAL HIGH (ref 0–200)
HDL: 41.2 mg/dL (ref >39.00)
LDL Cholesterol: 149 mg/dL — ABNORMAL HIGH (ref 0–99)
NonHDL: 172.11
Total CHOL/HDL Ratio: 5
Triglycerides: 114 mg/dL (ref 0.0–149.0)
VLDL: 22.8 mg/dL (ref 0.0–40.0)

## 2019-08-11 LAB — VITAMIN D 25 HYDROXY (VIT D DEFICIENCY, FRACTURES): VITD: 48.07 ng/mL (ref 30.00–100.00)

## 2019-08-11 LAB — TSH: TSH: 0.5 u[IU]/mL (ref 0.35–4.50)

## 2019-08-11 MED ORDER — ATORVASTATIN CALCIUM 40 MG PO TABS: 40.0000 mg | ORAL_TABLET | Freq: Every day | ORAL | 1 refills | Status: DC

## 2019-08-11 NOTE — Addendum Note (Signed)
Addended by: Janith Lima on: 08/11/2019 12:30 PM   Modules accepted: Orders

## 2019-09-16 ENCOUNTER — Other Ambulatory Visit: Payer: Self-pay | Admitting: Internal Medicine

## 2019-09-16 DIAGNOSIS — E118 Type 2 diabetes mellitus with unspecified complications: Secondary | ICD-10-CM

## 2019-09-19 ENCOUNTER — Other Ambulatory Visit: Payer: Self-pay | Admitting: Internal Medicine

## 2019-09-19 DIAGNOSIS — E559 Vitamin D deficiency, unspecified: Secondary | ICD-10-CM

## 2019-10-10 ENCOUNTER — Other Ambulatory Visit: Payer: Self-pay

## 2019-10-10 ENCOUNTER — Ambulatory Visit
Admission: RE | Admit: 2019-10-10 | Discharge: 2019-10-10 | Disposition: A | Discharge status: Home / Self Care | Payer: Managed Care, Other (non HMO) | Source: Ambulatory Visit | Attending: Internal Medicine | Admitting: Internal Medicine

## 2019-10-10 DIAGNOSIS — Z1231 Encounter for screening mammogram for malignant neoplasm of breast: Secondary | ICD-10-CM

## 2019-10-14 LAB — HM MAMMOGRAPHY

## 2019-12-09 ENCOUNTER — Encounter: Payer: Self-pay | Admitting: Internal Medicine

## 2019-12-10 ENCOUNTER — Other Ambulatory Visit: Payer: Self-pay | Admitting: Internal Medicine

## 2019-12-10 DIAGNOSIS — K219 Gastro-esophageal reflux disease without esophagitis: Secondary | ICD-10-CM

## 2020-01-13 ENCOUNTER — Other Ambulatory Visit: Payer: Self-pay | Admitting: Internal Medicine

## 2020-01-20 ENCOUNTER — Other Ambulatory Visit: Payer: Self-pay | Admitting: Internal Medicine

## 2020-01-20 DIAGNOSIS — E785 Hyperlipidemia, unspecified: Secondary | ICD-10-CM

## 2020-01-20 DIAGNOSIS — E118 Type 2 diabetes mellitus with unspecified complications: Secondary | ICD-10-CM

## 2020-01-20 DIAGNOSIS — I10 Essential (primary) hypertension: Secondary | ICD-10-CM

## 2020-02-17 ENCOUNTER — Other Ambulatory Visit: Payer: Self-pay | Admitting: Internal Medicine

## 2020-02-17 DIAGNOSIS — E559 Vitamin D deficiency, unspecified: Secondary | ICD-10-CM

## 2020-03-14 ENCOUNTER — Other Ambulatory Visit: Payer: Self-pay | Admitting: Internal Medicine

## 2020-03-14 DIAGNOSIS — E118 Type 2 diabetes mellitus with unspecified complications: Secondary | ICD-10-CM

## 2020-03-22 ENCOUNTER — Encounter: Payer: Self-pay | Admitting: Internal Medicine

## 2020-03-22 ENCOUNTER — Ambulatory Visit (INDEPENDENT_AMBULATORY_CARE_PROVIDER_SITE_OTHER): Payer: Managed Care, Other (non HMO) | Admitting: Internal Medicine

## 2020-03-22 ENCOUNTER — Other Ambulatory Visit: Payer: Self-pay

## 2020-03-22 DIAGNOSIS — E118 Type 2 diabetes mellitus with unspecified complications: Secondary | ICD-10-CM | POA: Diagnosis not present

## 2020-03-22 DIAGNOSIS — I10 Essential (primary) hypertension: Secondary | ICD-10-CM

## 2020-03-22 LAB — POCT GLYCOSYLATED HEMOGLOBIN (HGB A1C): Hemoglobin A1C: 6.8 % — AB (ref 4.0–5.6)

## 2020-03-22 MED ORDER — OLMESARTAN MEDOXOMIL 40 MG PO TABS: 40.0000 mg | ORAL_TABLET | Freq: Every day | ORAL | 1 refills | Status: DC

## 2020-03-22 MED ORDER — OZEMPIC (0.25 OR 0.5 MG/DOSE) 2 MG/1.5ML ~~LOC~~ SOPN: 0.5000 mg | PEN_INJECTOR | SUBCUTANEOUS | 1 refills | Status: DC

## 2020-03-22 MED ORDER — METFORMIN HCL ER 500 MG PO TB24: ORAL_TABLET | ORAL | 1 refills | Status: DC

## 2020-03-22 NOTE — Patient Instructions (Signed)
Diabetes Mellitus and Foot Care Foot care is an important part of your health, especially when you have diabetes. Diabetes may cause you to have problems because of poor blood flow (circulation) to your feet and legs, which can cause your skin to:  Become thinner and drier.  Break more easily.  Heal more slowly.  Peel and crack. You may also have nerve damage (neuropathy) in your legs and feet, causing decreased feeling in them. This means that you may not notice minor injuries to your feet that could lead to more serious problems. Noticing and addressing any potential problems early is the best way to prevent future foot problems. How to care for your feet Foot hygiene  Wash your feet daily with warm water and mild soap. Do not use hot water. Then, pat your feet and the areas between your toes until they are completely dry. Do not soak your feet as this can dry your skin.  Trim your toenails straight across. Do not dig under them or around the cuticle. File the edges of your nails with an emery board or nail file.  Apply a moisturizing lotion or petroleum jelly to the skin on your feet and to dry, brittle toenails. Use lotion that does not contain alcohol and is unscented. Do not apply lotion between your toes. Shoes and socks  Wear clean socks or stockings every day. Make sure they are not too tight. Do not wear knee-high stockings since they may decrease blood flow to your legs.  Wear shoes that fit properly and have enough cushioning. Always look in your shoes before you put them on to be sure there are no objects inside.  To break in new shoes, wear them for just a few hours a day. This prevents injuries on your feet. Wounds, scrapes, corns, and calluses  Check your feet daily for blisters, cuts, bruises, sores, and redness. If you cannot see the bottom of your feet, use a mirror or ask someone for help.  Do not cut corns or calluses or try to remove them with medicine.  If you  find a minor scrape, cut, or break in the skin on your feet, keep it and the skin around it clean and dry. You may clean these areas with mild soap and water. Do not clean the area with peroxide, alcohol, or iodine.  If you have a wound, scrape, corn, or callus on your foot, look at it several times a day to make sure it is healing and not infected. Check for: ? Redness, swelling, or pain. ? Fluid or blood. ? Warmth. ? Pus or a bad smell. General instructions  Do not cross your legs. This may decrease blood flow to your feet.  Do not use heating pads or hot water bottles on your feet. They may burn your skin. If you have lost feeling in your feet or legs, you may not know this is happening until it is too late.  Protect your feet from hot and cold by wearing shoes, such as at the beach or on hot pavement.  Schedule a complete foot exam at least once a year (annually) or more often if you have foot problems. If you have foot problems, report any cuts, sores, or bruises to your health care provider immediately. Contact a health care provider if:  You have a medical condition that increases your risk of infection and you have any cuts, sores, or bruises on your feet.  You have an injury that is not   healing.  You have redness on your legs or feet.  You feel burning or tingling in your legs or feet.  You have pain or cramps in your legs and feet.  Your legs or feet are numb.  Your feet always feel cold.  You have pain around a toenail. Get help right away if:  You have a wound, scrape, corn, or callus on your foot and: ? You have pain, swelling, or redness that gets worse. ? You have fluid or blood coming from the wound, scrape, corn, or callus. ? Your wound, scrape, corn, or callus feels warm to the touch. ? You have pus or a bad smell coming from the wound, scrape, corn, or callus. ? You have a fever. ? You have a red line going up your leg. Summary  Check your feet every day  for cuts, sores, red spots, swelling, and blisters.  Moisturize feet and legs daily.  Wear shoes that fit properly and have enough cushioning.  If you have foot problems, report any cuts, sores, or bruises to your health care provider immediately.  Schedule a complete foot exam at least once a year (annually) or more often if you have foot problems. This information is not intended to replace advice given to you by your health care provider. Make sure you discuss any questions you have with your health care provider. Document Revised: 07/30/2019 Document Reviewed: 12/08/2016 Elsevier Patient Education  2020 [employer] Inc.  

## 2020-03-22 NOTE — Progress Notes (Signed)
Subjective:  Patient ID: Brianna Lambert, female    DOB: 11-23-1955  Age: 64 y.o. MRN: [id number]  CC: Diabetes  This visit occurred during the SARS-CoV-2 public health emergency.  Safety protocols were in place, including screening questions prior to the visit, additional usage of staff PPE, and extensive cleaning of exam room while observing appropriate contact time as indicated for disinfecting solutions.    HPI Brianna Lambert presents for f/up on DM2 - She tells me that her blood sugars have been well controlled.  Outpatient Medications Prior to Visit  Medication Sig Dispense Refill  . ASPIRIN LOW DOSE 81 MG EC tablet TAKE 1 TABLET DAILY 90 tablet 3  . atorvastatin (LIPITOR) 40 MG tablet TAKE 1 TABLET DAILY 90 tablet 3  . Cholecalciferol (VITAMIN D3) 1.25 MG (50000 UT) CAPS TAKE 1 CAPSULE ONCE WEEKLY 4 capsule 1  . clobetasol (OLUX) 0.05 % topical foam Apply topically 2 (two) times daily. 100 g 3  . famotidine (PEPCID) 40 MG tablet TAKE 1 TABLET DAILY 90 tablet 1  . levalbuterol (XOPENEX HFA) 45 MCG/ACT inhaler Inhale 1-2 puffs into the lungs every 4 (four) hours as needed. Reported on 11/05/2015 1 Inhaler 6  . montelukast (SINGULAIR) 10 MG tablet TAKE 1 TABLET AT BEDTIME 90 tablet 1  . metFORMIN (GLUCOPHAGE-XR) 500 MG 24 hr tablet TAKE 1 TABLET TWICE A DAY (MUST KEEP SCHEDULED APPOINTMENT FOR FUTURE REFILLS) 180 tablet 1  . olmesartan (BENICAR) 40 MG tablet TAKE 1 TABLET DAILY 90 tablet 0  . Semaglutide,0.25 or 0.5MG /DOS, (OZEMPIC, 0.25 OR 0.5 MG/DOSE,) 2 MG/1.5ML SOPN Inject 0.5 mg into the skin once a week. 3 pen 1   No facility-administered medications prior to visit.    ROS Review of Systems  Constitutional: Negative for appetite change, diaphoresis, fatigue and unexpected weight change.  HENT: Negative.   Eyes: Negative for visual disturbance.  Respiratory: Negative for cough, chest tightness, shortness of breath and wheezing.   Cardiovascular: Negative for chest  pain, palpitations and leg swelling.  Gastrointestinal: Negative for abdominal pain, constipation, diarrhea, nausea and vomiting.  Endocrine: Negative.  Negative for polyphagia and polyuria.  Genitourinary: Negative.  Negative for difficulty urinating.  Musculoskeletal: Negative.  Negative for arthralgias.  Skin: Negative.  Negative for color change.  Neurological: Negative.  Negative for dizziness, weakness and light-headedness.  Hematological: Negative for adenopathy. Does not bruise/bleed easily.  Psychiatric/Behavioral: Negative.     Objective:  BP 140/80 (BP Location: Left Arm, Patient Position: Sitting, Cuff Size: Large)   Pulse 61   Temp 98.4 F (36.9 C) (Oral)   Resp 16   Ht 5\' 7"  (1.702 m)   Wt 270 lb (122.5 kg)   SpO2 98%   BMI 42.29 kg/m   BP Readings from Last 3 Encounters:  03/22/20 140/80  08/05/19 130/80  01/11/19 122/78    Wt Readings from Last 3 Encounters:  03/22/20 270 lb (122.5 kg)  08/05/19 275 lb (124.7 kg)  01/11/19 273 lb (123.8 kg)    Physical Exam Vitals reviewed.  Constitutional:      Appearance: She is obese.  HENT:     Nose: Nose normal.     Mouth/Throat:     Mouth: Mucous membranes are moist.  Eyes:     General: No scleral icterus.    Conjunctiva/sclera: Conjunctivae normal.  Cardiovascular:     Rate and Rhythm: Normal rate and regular rhythm.     Heart sounds: No murmur.  Pulmonary:     Effort: Pulmonary  effort is normal.     Breath sounds: No stridor. No wheezing, rhonchi or rales.  Abdominal:     General: Abdomen is protuberant. Bowel sounds are normal. There is no distension.     Palpations: Abdomen is soft. There is no hepatomegaly, splenomegaly or mass.     Tenderness: There is no abdominal tenderness.  Musculoskeletal:        General: Normal range of motion.     Cervical back: Neck supple.     Right lower leg: No edema.     Left lower leg: No edema.  Lymphadenopathy:     Cervical: No cervical adenopathy.  Skin:     General: Skin is warm and dry.  Neurological:     General: No focal deficit present.     Mental Status: She is alert.  Psychiatric:        Mood and Affect: Mood normal.        Behavior: Behavior normal.     Lab Results  Component Value Date   WBC 7.6 08/11/2019   HGB 14.3 08/11/2019   HCT 42.3 08/11/2019   PLT 318.0 08/11/2019   GLUCOSE 126 (H) 08/11/2019   CHOL 213 (H) 08/11/2019   TRIG 114.0 08/11/2019   HDL 41.20 08/11/2019   LDLDIRECT 155.6 07/11/2012   LDLCALC 149 (H) 08/11/2019   ALT 20 01/02/2019   AST 19 01/02/2019   NA 138 08/11/2019   K 4.0 08/11/2019   CL 103 08/11/2019   CREATININE 0.69 08/11/2019   BUN 14 08/11/2019   CO2 25 08/11/2019   TSH 0.50 08/11/2019   INR 0.97 10/27/2011   HGBA1C 6.8 (A) 03/22/2020   MICROALBUR <0.7 08/11/2019    MM DIGITAL SCREENING BILATERAL  Result Date: 10/14/2019 CLINICAL DATA:  Screening. EXAM: DIGITAL SCREENING BILATERAL MAMMOGRAM WITH CAD COMPARISON:  Previous exam(s). ACR Breast Density Category a: The breast tissue is almost entirely fatty. FINDINGS: There are no findings suspicious for malignancy. Images were processed with CAD. IMPRESSION: No mammographic evidence of malignancy. A result letter of this screening mammogram will be mailed directly to the patient. RECOMMENDATION: Screening mammogram in one year. (Code:SM-B-01Y) BI-RADS CATEGORY  1: Negative. Electronically Signed   By: Emmaline Kluver M.D.   On: 10/14/2019 11:54    Assessment & Plan:   [name] was seen today for diabetes.  Diagnoses and all orders for this visit:  Type II diabetes mellitus with manifestations- Her A1C is at 6.8%. Her blood sugars are adequately well controlled. -     metFORMIN (GLUCOPHAGE-XR) 500 MG 24 hr tablet; TAKE 1 TABLET TWICE A DAY (MUST KEEP SCHEDULED APPOINTMENT FOR FUTURE REFILLS) -     olmesartan (BENICAR) 40 MG tablet; Take 1 tablet (40 mg total) by mouth daily. -     Semaglutide,0.25 or 0.5MG /DOS, (OZEMPIC, 0.25 OR 0.5  MG/DOSE,) 2 MG/1.5ML SOPN; Inject 0.5 mg into the skin once a week. -     POCT glycosylated hemoglobin (Hb A1C)  Type 2 diabetes mellitus with complication, without long-term current use of insulin (HCC) -     metFORMIN (GLUCOPHAGE-XR) 500 MG 24 hr tablet; TAKE 1 TABLET TWICE A DAY (MUST KEEP SCHEDULED APPOINTMENT FOR FUTURE REFILLS)  Essential hypertension, benign- Her blood pressure is adequately well controlled. -     olmesartan (BENICAR) 40 MG tablet; Take 1 tablet (40 mg total) by mouth daily.   I have changed Bonnita Levan. Forbush's olmesartan. I am also having her maintain her levalbuterol, Aspirin Low Dose, clobetasol, famotidine, montelukast, atorvastatin,  Vitamin D3, metFORMIN, and Ozempic (0.25 or 0.5 MG/DOSE).  Meds ordered this encounter  Medications  . metFORMIN (GLUCOPHAGE-XR) 500 MG 24 hr tablet    Sig: TAKE 1 TABLET TWICE A DAY (MUST KEEP SCHEDULED APPOINTMENT FOR FUTURE REFILLS)    Dispense:  180 tablet    Refill:  1  . olmesartan (BENICAR) 40 MG tablet    Sig: Take 1 tablet (40 mg total) by mouth daily.    Dispense:  90 tablet    Refill:  1  . Semaglutide,0.25 or 0.5MG /DOS, (OZEMPIC, 0.25 OR 0.5 MG/DOSE,) 2 MG/1.5ML SOPN    Sig: Inject 0.5 mg into the skin once a week.    Dispense:  3 pen    Refill:  1     Follow-up: Return in about 6 months (around 09/22/2020).  Scarlette Calico, MD

## 2020-03-25 ENCOUNTER — Telehealth: Payer: Self-pay | Admitting: Internal Medicine

## 2020-03-25 DIAGNOSIS — E118 Type 2 diabetes mellitus with unspecified complications: Secondary | ICD-10-CM

## 2020-03-25 DIAGNOSIS — I10 Essential (primary) hypertension: Secondary | ICD-10-CM

## 2020-03-25 MED ORDER — OLMESARTAN MEDOXOMIL 40 MG PO TABS: 40.0000 mg | ORAL_TABLET | Freq: Every day | ORAL | 0 refills | Status: DC

## 2020-03-25 NOTE — Telephone Encounter (Signed)
New message:   1.Medication Requested: olmesartan (BENICAR) 40 MG tablet 2. Pharmacy (Name, Street, [place]): [facility] at [facility] [address], [place] - [address] 3. On Med List: Yes  4. Last Visit with PCP: 03/22/20  5. Next visit date with PCP: None   Agent: Please be advised that RX refills may take up to 3 business days. We ask that you follow-up with your pharmacy.

## 2020-03-25 NOTE — Telephone Encounter (Signed)
erx sent to pharmacy as requested.  °

## 2020-04-06 LAB — HM DIABETES EYE EXAM

## 2020-07-12 ENCOUNTER — Other Ambulatory Visit: Payer: Self-pay | Admitting: Internal Medicine

## 2020-08-09 ENCOUNTER — Encounter: Payer: Self-pay | Admitting: Internal Medicine

## 2020-08-23 ENCOUNTER — Other Ambulatory Visit: Payer: Self-pay

## 2020-08-23 ENCOUNTER — Encounter: Payer: Self-pay | Admitting: Internal Medicine

## 2020-08-23 ENCOUNTER — Ambulatory Visit (INDEPENDENT_AMBULATORY_CARE_PROVIDER_SITE_OTHER): Payer: Managed Care, Other (non HMO) | Admitting: Internal Medicine

## 2020-08-23 VITALS — BP 136/86 | HR 73 | Temp 98.0°F | Resp 16 | Ht 67.0 in | Wt 238.0 lb

## 2020-08-23 DIAGNOSIS — Z0000 Encounter for general adult medical examination without abnormal findings: Secondary | ICD-10-CM

## 2020-08-23 DIAGNOSIS — E785 Hyperlipidemia, unspecified: Secondary | ICD-10-CM | POA: Diagnosis not present

## 2020-08-23 DIAGNOSIS — Z23 Encounter for immunization: Secondary | ICD-10-CM

## 2020-08-23 DIAGNOSIS — E2839 Other primary ovarian failure: Secondary | ICD-10-CM | POA: Insufficient documentation

## 2020-08-23 DIAGNOSIS — E118 Type 2 diabetes mellitus with unspecified complications: Secondary | ICD-10-CM

## 2020-08-23 DIAGNOSIS — Z1231 Encounter for screening mammogram for malignant neoplasm of breast: Secondary | ICD-10-CM

## 2020-08-23 DIAGNOSIS — E559 Vitamin D deficiency, unspecified: Secondary | ICD-10-CM | POA: Diagnosis not present

## 2020-08-23 DIAGNOSIS — I10 Essential (primary) hypertension: Secondary | ICD-10-CM | POA: Diagnosis not present

## 2020-08-23 DIAGNOSIS — I700 Atherosclerosis of aorta: Secondary | ICD-10-CM | POA: Insufficient documentation

## 2020-08-23 LAB — BASIC METABOLIC PANEL
BUN: 15 mg/dL (ref 6–23)
CO2: 26 mEq/L (ref 19–32)
Calcium: 9.4 mg/dL (ref 8.4–10.5)
Chloride: 103 mEq/L (ref 96–112)
Creatinine, Ser: 0.92 mg/dL (ref 0.40–1.20)
GFR: 61.24 mL/min (ref >60.00)
Glucose, Bld: 126 mg/dL — ABNORMAL HIGH (ref 70–99)
Potassium: 3.9 mEq/L (ref 3.5–5.1)
Sodium: 138 mEq/L (ref 135–145)

## 2020-08-23 LAB — CBC WITH DIFFERENTIAL/PLATELET
Basophils Absolute: 0 10*3/uL (ref 0.0–0.1)
Basophils Relative: 0.6 % (ref 0.0–3.0)
Eosinophils Absolute: 0.2 10*3/uL (ref 0.0–0.7)
Eosinophils Relative: 2.1 % (ref 0.0–5.0)
HCT: 40.1 % (ref 36.0–46.0)
Hemoglobin: 13.6 g/dL (ref 12.0–15.0)
Lymphocytes Relative: 28.9 % (ref 12.0–46.0)
Lymphs Abs: 2.3 10*3/uL (ref 0.7–4.0)
MCHC: 33.9 g/dL (ref 30.0–36.0)
MCV: 90 fl (ref 78.0–100.0)
Monocytes Absolute: 0.6 10*3/uL (ref 0.1–1.0)
Monocytes Relative: 7.9 % (ref 3.0–12.0)
Neutro Abs: 4.8 10*3/uL (ref 1.4–7.7)
Neutrophils Relative %: 60.5 % (ref 43.0–77.0)
Platelets: 308 10*3/uL (ref 150.0–400.0)
RBC: 4.46 Mil/uL (ref 3.87–5.11)
RDW: 13.6 % (ref 11.5–15.5)
WBC: 7.8 10*3/uL (ref 4.0–10.5)

## 2020-08-23 LAB — URINALYSIS, ROUTINE W REFLEX MICROSCOPIC
Bilirubin Urine: NEGATIVE
Hgb urine dipstick: NEGATIVE
Ketones, ur: NEGATIVE
Leukocytes,Ua: NEGATIVE
Nitrite: NEGATIVE
Specific Gravity, Urine: 1.025 (ref 1.000–1.030)
Total Protein, Urine: NEGATIVE
Urine Glucose: NEGATIVE
Urobilinogen, UA: 0.2 (ref 0.0–1.0)
pH: 6 (ref 5.0–8.0)

## 2020-08-23 LAB — HEPATIC FUNCTION PANEL
ALT: 17 U/L (ref 0–35)
AST: 20 U/L (ref 0–37)
Albumin: 3.9 g/dL (ref 3.5–5.2)
Alkaline Phosphatase: 40 U/L (ref 39–117)
Bilirubin, Direct: 0.1 mg/dL (ref 0.0–0.3)
Total Bilirubin: 0.4 mg/dL (ref 0.2–1.2)
Total Protein: 6.7 g/dL (ref 6.0–8.3)

## 2020-08-23 LAB — MICROALBUMIN / CREATININE URINE RATIO
Creatinine,U: 135.2 mg/dL
Microalb Creat Ratio: 0.5 mg/g (ref 0.0–30.0)
Microalb, Ur: <0.7 mg/dL (ref 0.0–1.9)

## 2020-08-23 LAB — LIPID PANEL
Cholesterol: 208 mg/dL — ABNORMAL HIGH (ref 0–200)
HDL: 42 mg/dL (ref >39.00)
LDL Cholesterol: 139 mg/dL — ABNORMAL HIGH (ref 0–99)
NonHDL: 165.52
Total CHOL/HDL Ratio: 5
Triglycerides: 132 mg/dL (ref 0.0–149.0)
VLDL: 26.4 mg/dL (ref 0.0–40.0)

## 2020-08-23 LAB — HEMOGLOBIN A1C: Hgb A1c MFr Bld: 6.2 % (ref 4.6–6.5)

## 2020-08-23 LAB — VITAMIN D 25 HYDROXY (VIT D DEFICIENCY, FRACTURES): VITD: 33.87 ng/mL (ref 30.00–100.00)

## 2020-08-23 LAB — TSH: TSH: 0.77 u[IU]/mL (ref 0.35–4.50)

## 2020-08-23 NOTE — Patient Instructions (Signed)
Health Maintenance, Female Adopting a healthy lifestyle and getting preventive care are important in promoting health and wellness. Ask your health care provider about:  The right schedule for you to have regular tests and exams.  Things you can do on your own to prevent diseases and keep yourself healthy. What should I know about diet, weight, and exercise? Eat a healthy diet   Eat a diet that includes plenty of vegetables, fruits, low-fat dairy products, and lean protein.  Do not eat a lot of foods that are high in solid fats, added sugars, or sodium. Maintain a healthy weight Body mass index (BMI) is used to identify weight problems. It estimates body fat based on height and weight. Your health care provider can help determine your BMI and help you achieve or maintain a healthy weight. Get regular exercise Get regular exercise. This is one of the most important things you can do for your health. Most adults should:  Exercise for at least 150 minutes each week. The exercise should increase your heart rate and make you sweat (moderate-intensity exercise).  Do strengthening exercises at least twice a week. This is in addition to the moderate-intensity exercise.  Spend less time sitting. Even light physical activity can be beneficial. Watch cholesterol and blood lipids Have your blood tested for lipids and cholesterol at 65 years of age, then have this test every 5 years. Have your cholesterol levels checked more often if:  Your lipid or cholesterol levels are high.  You are older than 65 years of age.  You are at high risk for heart disease. What should I know about cancer screening? Depending on your health history and family history, you may need to have cancer screening at various ages. This may include screening for:  Breast cancer.  Cervical cancer.  Colorectal cancer.  Skin cancer.  Lung cancer. What should I know about heart disease, diabetes, and high blood  pressure? Blood pressure and heart disease  High blood pressure causes heart disease and increases the risk of stroke. This is more likely to develop in people who have high blood pressure readings, are of African descent, or are overweight.  Have your blood pressure checked: ? Every 3-5 years if you are 18-39 years of age. ? Every year if you are 40 years old or older. Diabetes Have regular diabetes screenings. This checks your fasting blood sugar level. Have the screening done:  Once every three years after age 40 if you are at a normal weight and have a low risk for diabetes.  More often and at a younger age if you are overweight or have a high risk for diabetes. What should I know about preventing infection? Hepatitis B If you have a higher risk for hepatitis B, you should be screened for this virus. Talk with your health care provider to find out if you are at risk for hepatitis B infection. Hepatitis C Testing is recommended for:  Everyone born from 1945 through 1965.  Anyone with known risk factors for hepatitis C. Sexually transmitted infections (STIs)  Get screened for STIs, including gonorrhea and chlamydia, if: ? You are sexually active and are younger than 65 years of age. ? You are older than 65 years of age and your health care provider tells you that you are at risk for this type of infection. ? Your sexual activity has changed since you were last screened, and you are at increased risk for chlamydia or gonorrhea. Ask your health care provider if   you are at risk.  Ask your health care provider about whether you are at high risk for HIV. Your health care provider may recommend a prescription medicine to help prevent HIV infection. If you choose to take medicine to prevent HIV, you should first get tested for HIV. You should then be tested every 3 months for as long as you are taking the medicine. Pregnancy  If you are about to stop having your period (premenopausal) and  you may become pregnant, seek counseling before you get pregnant.  Take 400 to 800 micrograms (mcg) of folic acid every day if you become pregnant.  Ask for birth control (contraception) if you want to prevent pregnancy. Osteoporosis and menopause Osteoporosis is a disease in which the bones lose minerals and strength with aging. This can result in bone fractures. If you are 65 years old or older, or if you are at risk for osteoporosis and fractures, ask your health care provider if you should:  Be screened for bone loss.  Take a calcium or vitamin D supplement to lower your risk of fractures.  Be given hormone replacement therapy (HRT) to treat symptoms of menopause. Follow these instructions at home: Lifestyle  Do not use any products that contain nicotine or tobacco, such as cigarettes, e-cigarettes, and chewing tobacco. If you need help quitting, ask your health care provider.  Do not use street drugs.  Do not share needles.  Ask your health care provider for help if you need support or information about quitting drugs. Alcohol use  Do not drink alcohol if: ? Your health care provider tells you not to drink. ? You are pregnant, may be pregnant, or are planning to become pregnant.  If you drink alcohol: ? Limit how much you use to 0-1 drink a day. ? Limit intake if you are breastfeeding.  Be aware of how much alcohol is in your drink. In the [place] one drink equals one 12 oz bottle of beer (355 mL), one 5 oz glass of wine (148 mL), or one 1 oz glass of hard liquor (44 mL). General instructions  Schedule regular health, dental, and eye exams.  Stay current with your vaccines.  Tell your health care provider if: ? You often feel depressed. ? You have ever been abused or do not feel safe at home. Summary  Adopting a healthy lifestyle and getting preventive care are important in promoting health and wellness.  Follow your health care provider's instructions about healthy  diet, exercising, and getting tested or screened for diseases.  Follow your health care provider's instructions on monitoring your cholesterol and blood pressure. This information is not intended to replace advice given to you by your health care provider. Make sure you discuss any questions you have with your health care provider. Document Revised: 10/30/2018 Document Reviewed: 10/30/2018 Elsevier Patient Education  2020 Elsevier Inc.  

## 2020-08-23 NOTE — Progress Notes (Signed)
Subjective:  Patient ID: Brianna Lambert, female    DOB: 08-04-1956  Age: 64 y.o. MRN: [id number]  CC: Annual Exam, Hypertension, Hyperlipidemia, and Diabetes  This visit occurred during the SARS-CoV-2 public health emergency.  Safety protocols were in place, including screening questions prior to the visit, additional usage of staff PPE, and extensive cleaning of exam room while observing appropriate contact time as indicated for disinfecting solutions.    HPI Glennice Marcos presents for a CPX.  She reports that her blood sugar and blood pressure have been well controlled.  She is very active and denies any recent episodes of chest pain, shortness of breath, palpitations, edema, fatigue, or palpitations.  She has intentionally lost weight with lifestyle modifications.  Outpatient Medications Prior to Visit  Medication Sig Dispense Refill  . ASPIRIN LOW DOSE 81 MG EC tablet TAKE 1 TABLET DAILY 90 tablet 3  . atorvastatin (LIPITOR) 40 MG tablet TAKE 1 TABLET DAILY 90 tablet 3  . Cholecalciferol (VITAMIN D3) 1.25 MG (50000 UT) CAPS TAKE 1 CAPSULE ONCE WEEKLY 4 capsule 1  . clobetasol (OLUX) 0.05 % topical foam Apply topically 2 (two) times daily. 100 g 3  . famotidine (PEPCID) 40 MG tablet TAKE 1 TABLET DAILY 90 tablet 1  . levalbuterol (XOPENEX HFA) 45 MCG/ACT inhaler Inhale 1-2 puffs into the lungs every 4 (four) hours as needed. Reported on 11/05/2015 1 Inhaler 6  . metFORMIN (GLUCOPHAGE-XR) 500 MG 24 hr tablet TAKE 1 TABLET TWICE A DAY (MUST KEEP SCHEDULED APPOINTMENT FOR FUTURE REFILLS) 180 tablet 1  . montelukast (SINGULAIR) 10 MG tablet TAKE 1 TABLET AT BEDTIME 90 tablet 1  . olmesartan (BENICAR) 40 MG tablet Take 1 tablet (40 mg total) by mouth daily. 30 tablet 0  . Semaglutide,0.25 or 0.5MG /DOS, (OZEMPIC, 0.25 OR 0.5 MG/DOSE,) 2 MG/1.5ML SOPN Inject 0.5 mg into the skin once a week. 3 pen 1   No facility-administered medications prior to visit.    ROS Review of Systems   Constitutional: Negative for appetite change, diaphoresis, fatigue and unexpected weight change.  HENT: Negative.   Eyes: Negative for visual disturbance.  Respiratory: Negative for cough, chest tightness, shortness of breath and wheezing.   Cardiovascular: Negative for chest pain, palpitations and leg swelling.  Gastrointestinal: Negative for abdominal pain, blood in stool, constipation, diarrhea, nausea and vomiting.  Endocrine: Negative.  Negative for polydipsia, polyphagia and polyuria.  Genitourinary: Negative for difficulty urinating and dysuria.  Musculoskeletal: Negative for arthralgias and myalgias.  Skin: Negative.  Negative for color change and pallor.  Neurological: Negative.  Negative for dizziness, weakness and light-headedness.  Hematological: Negative for adenopathy. Does not bruise/bleed easily.  Psychiatric/Behavioral: Negative.     Objective:  BP 136/86   Pulse 73   Temp 98 F (36.7 C) (Oral)   Resp 16   Ht 5\' 7"  (1.702 m)   Wt 238 lb (108 kg)   SpO2 97%   BMI 37.28 kg/m   BP Readings from Last 3 Encounters:  08/23/20 136/86  03/22/20 140/80  08/05/19 130/80    Wt Readings from Last 3 Encounters:  08/23/20 238 lb (108 kg)  03/22/20 270 lb (122.5 kg)  08/05/19 275 lb (124.7 kg)    Physical Exam Vitals reviewed.  Constitutional:      Appearance: She is obese.  HENT:     Nose: Nose normal.     Mouth/Throat:     Mouth: Mucous membranes are moist.  Eyes:     General: No scleral  icterus.    Conjunctiva/sclera: Conjunctivae normal.  Cardiovascular:     Rate and Rhythm: Normal rate and regular rhythm.     Heart sounds: No murmur heard.   Pulmonary:     Effort: Pulmonary effort is normal.     Breath sounds: No stridor. No wheezing, rhonchi or rales.  Abdominal:     General: Abdomen is protuberant. Bowel sounds are normal. There is no distension.     Palpations: Abdomen is soft. There is no hepatomegaly, splenomegaly or mass.     Tenderness:  There is no abdominal tenderness.  Musculoskeletal:        General: Normal range of motion.     Cervical back: Neck supple.     Right lower leg: No edema.     Left lower leg: No edema.  Lymphadenopathy:     Cervical: No cervical adenopathy.  Skin:    General: Skin is warm and dry.     Coloration: Skin is not pale.  Neurological:     General: No focal deficit present.     Mental Status: She is alert.  Psychiatric:        Mood and Affect: Mood normal.        Behavior: Behavior normal.    `  Lab Results  Component Value Date   WBC 7.8 08/23/2020   HGB 13.6 08/23/2020   HCT 40.1 08/23/2020   PLT 308.0 08/23/2020   GLUCOSE 126 (H) 08/23/2020   CHOL 208 (H) 08/23/2020   TRIG 132.0 08/23/2020   HDL 42.00 08/23/2020   LDLDIRECT 155.6 07/11/2012   LDLCALC 139 (H) 08/23/2020   ALT 17 08/23/2020   AST 20 08/23/2020   NA 138 08/23/2020   K 3.9 08/23/2020   CL 103 08/23/2020   CREATININE 0.92 08/23/2020   BUN 15 08/23/2020   CO2 26 08/23/2020   TSH 0.77 08/23/2020   INR 0.97 10/27/2011   HGBA1C 6.2 08/23/2020   MICROALBUR <0.7 08/23/2020    MM DIGITAL SCREENING BILATERAL  Result Date: 10/14/2019 CLINICAL DATA:  Screening. EXAM: DIGITAL SCREENING BILATERAL MAMMOGRAM WITH CAD COMPARISON:  Previous exam(s). ACR Breast Density Category a: The breast tissue is almost entirely fatty. FINDINGS: There are no findings suspicious for malignancy. Images were processed with CAD. IMPRESSION: No mammographic evidence of malignancy. A result letter of this screening mammogram will be mailed directly to the patient. RECOMMENDATION: Screening mammogram in one year. (Code:SM-B-01Y) BI-RADS CATEGORY  1: Negative. Electronically Signed   By: Emmaline Kluver M.D.   On: 10/14/2019 11:54    Assessment & Plan:   Demani was seen today for annual exam, hypertension, hyperlipidemia and diabetes.  Diagnoses and all orders for this visit:  Flu vaccine need -     Flu Vaccine QUAD High  Dose(Fluad)  Essential hypertension, benign- Her blood pressure is adequately well controlled.  Electrolytes and renal function are normal.  Will continue the ARB at the current dose. -     CBC with Differential/Platelet; Future -     BASIC METABOLIC PANEL WITH GFR; Future -     TSH; Future -     Urinalysis, Routine w reflex microscopic; Future -     Basic Metabolic Panel (BMET); Future -     CBC with Differential/Platelet -     TSH -     Urinalysis, Routine w reflex microscopic -     Basic Metabolic Panel (BMET)  Type II diabetes mellitus with manifestations- Her A1c is down to 6.2%.  She was  praised for her lifestyle modifications.  Will continue the current combination of Metformin and a GLP-1 agonist. -     BASIC METABOLIC PANEL WITH GFR; Future -     Hemoglobin A1c; Future -     Microalbumin / creatinine urine ratio; Future -     HM Diabetes Foot Exam -     Basic Metabolic Panel (BMET); Future -     Hemoglobin A1c -     Microalbumin / creatinine urine ratio -     Basic Metabolic Panel (BMET)  Routine general medical examination at a health care facility- Exam completed, labs reviewed, vaccines reviewed and updated, cancer screenings are up-to-date, patient education was given.  Hyperlipidemia with target LDL less than 100- She has achieved her LDL goal is doing well on the statin. -     Lipid panel; Future -     Hepatic function panel; Future -     TSH; Future -     Lipid panel -     Hepatic function panel -     TSH  Vitamin D deficiency disease -     VITAMIN D 25 Hydroxy (Vit-D Deficiency, Fractures); Future -     VITAMIN D 25 Hydroxy (Vit-D Deficiency, Fractures)  Visit for screening mammogram -     MM DIGITAL SCREENING BILATERAL; Future  Estrogen deficiency -     DG Bone Density; Future  Atherosclerosis of aorta (HCC)- Will continue to address risk factor modifications.  Other orders -     Pneumococcal conjugate vaccine 13-valent   I am having Daleen Bo  maintain her levalbuterol, Aspirin Low Dose, clobetasol, famotidine, atorvastatin, Vitamin D3, metFORMIN, Ozempic (0.25 or 0.5 MG/DOSE), olmesartan, and montelukast.  No orders of the defined types were placed in this encounter.    Follow-up: Return in about 6 months (around 02/21/2021).  Sanda Linger, MD

## 2020-09-10 ENCOUNTER — Other Ambulatory Visit: Payer: Self-pay | Admitting: Internal Medicine

## 2020-09-10 DIAGNOSIS — E118 Type 2 diabetes mellitus with unspecified complications: Secondary | ICD-10-CM

## 2020-09-23 ENCOUNTER — Other Ambulatory Visit: Payer: Self-pay | Admitting: Internal Medicine

## 2020-09-23 DIAGNOSIS — I10 Essential (primary) hypertension: Secondary | ICD-10-CM

## 2020-09-23 DIAGNOSIS — E118 Type 2 diabetes mellitus with unspecified complications: Secondary | ICD-10-CM

## 2020-09-29 ENCOUNTER — Other Ambulatory Visit: Payer: Self-pay | Admitting: Internal Medicine

## 2020-09-29 DIAGNOSIS — E118 Type 2 diabetes mellitus with unspecified complications: Secondary | ICD-10-CM

## 2020-10-27 ENCOUNTER — Other Ambulatory Visit: Payer: Self-pay | Admitting: Internal Medicine

## 2020-10-27 DIAGNOSIS — Z1231 Encounter for screening mammogram for malignant neoplasm of breast: Secondary | ICD-10-CM

## 2020-11-08 ENCOUNTER — Ambulatory Visit
Admission: RE | Admit: 2020-11-08 | Discharge: 2020-11-08 | Disposition: A | Discharge status: Home / Self Care | Payer: Managed Care, Other (non HMO) | Source: Ambulatory Visit

## 2020-11-08 DIAGNOSIS — Z1231 Encounter for screening mammogram for malignant neoplasm of breast: Secondary | ICD-10-CM

## 2020-11-09 ENCOUNTER — Ambulatory Visit
Admission: RE | Admit: 2020-11-09 | Discharge: 2020-11-09 | Disposition: A | Discharge status: Home / Self Care | Payer: Managed Care, Other (non HMO) | Source: Ambulatory Visit | Attending: Internal Medicine | Admitting: Internal Medicine

## 2020-11-09 ENCOUNTER — Other Ambulatory Visit: Payer: Self-pay

## 2020-11-09 DIAGNOSIS — E2839 Other primary ovarian failure: Secondary | ICD-10-CM

## 2020-11-09 LAB — HM DEXA SCAN: HM Dexa Scan: NORMAL

## 2020-11-10 LAB — HM MAMMOGRAPHY

## 2021-01-07 ENCOUNTER — Other Ambulatory Visit: Payer: Self-pay | Admitting: Internal Medicine

## 2021-01-14 ENCOUNTER — Other Ambulatory Visit: Payer: Self-pay | Admitting: Internal Medicine

## 2021-01-14 DIAGNOSIS — E785 Hyperlipidemia, unspecified: Secondary | ICD-10-CM

## 2021-02-14 ENCOUNTER — Telehealth: Payer: Managed Care, Other (non HMO) | Admitting: Internal Medicine

## 2021-02-14 ENCOUNTER — Telehealth: Payer: Self-pay | Admitting: Internal Medicine

## 2021-02-14 NOTE — Telephone Encounter (Signed)
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 Symptoms started mid week with clear discharge and reports that she needs an antibiotic and breathing treatment. Reports sinus pressure. Does not have a thermometer. Denies covid exposure/travel. Fully vaccinated and boosted.  [facility] advised to call PCP now

## 2021-02-14 NOTE — Telephone Encounter (Signed)
noted 

## 2021-02-14 NOTE — Telephone Encounter (Signed)
I have tried to contact the patient on both primary and secondary phone, no answer

## 2021-03-09 ENCOUNTER — Other Ambulatory Visit: Payer: Self-pay | Admitting: Internal Medicine

## 2021-03-09 DIAGNOSIS — E118 Type 2 diabetes mellitus with unspecified complications: Secondary | ICD-10-CM

## 2021-03-10 ENCOUNTER — Telehealth: Payer: Self-pay | Admitting: Emergency Medicine

## 2021-03-10 ENCOUNTER — Other Ambulatory Visit: Payer: Self-pay | Admitting: Internal Medicine

## 2021-03-10 DIAGNOSIS — E118 Type 2 diabetes mellitus with unspecified complications: Secondary | ICD-10-CM

## 2021-03-10 MED ORDER — METFORMIN HCL ER 500 MG PO TB24: 1000.0000 mg | ORAL_TABLET | Freq: Every day | ORAL | 0 refills | Status: DC

## 2021-03-10 NOTE — Telephone Encounter (Signed)
Pt is requesting a refill on her Metformin until she is able to be seen on 04/12/2021. Thanks.

## 2021-03-22 ENCOUNTER — Other Ambulatory Visit: Payer: Self-pay | Admitting: Internal Medicine

## 2021-03-22 DIAGNOSIS — E118 Type 2 diabetes mellitus with unspecified complications: Secondary | ICD-10-CM

## 2021-03-22 DIAGNOSIS — I10 Essential (primary) hypertension: Secondary | ICD-10-CM

## 2021-04-11 ENCOUNTER — Other Ambulatory Visit: Payer: Self-pay

## 2021-04-12 ENCOUNTER — Encounter: Payer: Self-pay | Admitting: Internal Medicine

## 2021-04-12 ENCOUNTER — Ambulatory Visit: Payer: Managed Care, Other (non HMO) | Admitting: Internal Medicine

## 2021-04-12 VITALS — BP 128/80 | HR 70 | Temp 98.4°F | Ht 67.0 in | Wt 254.0 lb

## 2021-04-12 DIAGNOSIS — Z23 Encounter for immunization: Secondary | ICD-10-CM | POA: Diagnosis not present

## 2021-04-12 DIAGNOSIS — J45998 Other asthma: Secondary | ICD-10-CM | POA: Diagnosis not present

## 2021-04-12 DIAGNOSIS — E785 Hyperlipidemia, unspecified: Secondary | ICD-10-CM | POA: Diagnosis not present

## 2021-04-12 DIAGNOSIS — E118 Type 2 diabetes mellitus with unspecified complications: Secondary | ICD-10-CM

## 2021-04-12 DIAGNOSIS — I10 Essential (primary) hypertension: Secondary | ICD-10-CM

## 2021-04-12 LAB — CBC WITH DIFFERENTIAL/PLATELET
Basophils Absolute: 0.1 10*3/uL (ref 0.0–0.1)
Basophils Relative: 0.7 % (ref 0.0–3.0)
Eosinophils Absolute: 0.1 10*3/uL (ref 0.0–0.7)
Eosinophils Relative: 1.9 % (ref 0.0–5.0)
HCT: 39.7 % (ref 36.0–46.0)
Hemoglobin: 13.5 g/dL (ref 12.0–15.0)
Lymphocytes Relative: 28 % (ref 12.0–46.0)
Lymphs Abs: 2.1 10*3/uL (ref 0.7–4.0)
MCHC: 33.9 g/dL (ref 30.0–36.0)
MCV: 90.4 fl (ref 78.0–100.0)
Monocytes Absolute: 0.6 10*3/uL (ref 0.1–1.0)
Monocytes Relative: 7.5 % (ref 3.0–12.0)
Neutro Abs: 4.7 10*3/uL (ref 1.4–7.7)
Neutrophils Relative %: 61.9 % (ref 43.0–77.0)
Platelets: 313 10*3/uL (ref 150.0–400.0)
RBC: 4.39 Mil/uL (ref 3.87–5.11)
RDW: 13.5 % (ref 11.5–15.5)
WBC: 7.7 10*3/uL (ref 4.0–10.5)

## 2021-04-12 LAB — BASIC METABOLIC PANEL
BUN: 12 mg/dL (ref 6–23)
CO2: 26 mEq/L (ref 19–32)
Calcium: 9.3 mg/dL (ref 8.4–10.5)
Chloride: 104 mEq/L (ref 96–112)
Creatinine, Ser: 0.74 mg/dL (ref 0.40–1.20)
GFR: 84.7 mL/min (ref >60.00)
Glucose, Bld: 123 mg/dL — ABNORMAL HIGH (ref 70–99)
Potassium: 4.4 mEq/L (ref 3.5–5.1)
Sodium: 137 mEq/L (ref 135–145)

## 2021-04-12 LAB — HEMOGLOBIN A1C: Hgb A1c MFr Bld: 6.4 % (ref 4.6–6.5)

## 2021-04-12 MED ORDER — MONTELUKAST SODIUM 10 MG PO TABS: 1.0000 | ORAL_TABLET | Freq: Every day | ORAL | 1 refills | Status: DC

## 2021-04-12 MED ORDER — OLMESARTAN MEDOXOMIL 40 MG PO TABS
40.0000 mg | ORAL_TABLET | Freq: Every day | ORAL | 1 refills | Status: DC
Start: 2021-04-12 — End: 2021-09-15

## 2021-04-12 MED ORDER — ATORVASTATIN CALCIUM 40 MG PO TABS: 1.0000 | ORAL_TABLET | Freq: Every day | ORAL | 1 refills | Status: DC

## 2021-04-12 MED ORDER — OZEMPIC (2 MG/DOSE) 8 MG/3ML ~~LOC~~ SOPN: 2.0000 mg | PEN_INJECTOR | SUBCUTANEOUS | 1 refills | Status: DC

## 2021-04-12 MED ORDER — METFORMIN HCL ER 500 MG PO TB24: 1000.0000 mg | ORAL_TABLET | Freq: Every day | ORAL | 1 refills | Status: DC

## 2021-04-12 NOTE — Patient Instructions (Signed)
Type 2 Diabetes Mellitus, Diagnosis, Adult Type 2 diabetes (type 2 diabetes mellitus) is a long-term, or chronic, disease. In type 2 diabetes, one or both of these problems may be present:  The pancreas does not make enough of a hormone called insulin.  Cells in the body do not respond properly to insulin that the body makes (insulin resistance). Normally, insulin allows blood sugar (glucose) to enter cells in the body. The cells use glucose for energy. Insulin resistance or lack of insulin causes excess glucose to build up in the blood instead of going into cells. This causes high blood glucose (hyperglycemia).  What are the causes? The exact cause of type 2 diabetes is not known. What increases the risk? The following factors may make you more likely to develop this condition:  Having a family member with type 2 diabetes.  Being overweight or obese.  Being inactive (sedentary).  Having been diagnosed with insulin resistance.  Having a history of prediabetes, diabetes when you were pregnant (gestational diabetes), or polycystic ovary syndrome (PCOS). What are the signs or symptoms? In the early stage of this condition, you may not have symptoms. Symptoms develop slowly and may include:  Increased thirst or hunger.  Increased urination.  Unexplained weight loss.  Tiredness (fatigue) or weakness.  Vision changes, such as blurry vision.  Dark patches on the skin. How is this diagnosed? This condition is diagnosed based on your symptoms, your medical history, a physical exam, and your blood glucose level. Your blood glucose may be checked with one or more of the following blood tests:  A fasting blood glucose (FBG) test. You will not be allowed to eat (you will fast) for 8 hours or longer before a blood sample is taken.  A random blood glucose test. This test checks blood glucose at any time of day regardless of when you ate.  An A1C (hemoglobin A1C) blood test. This test  provides information about blood glucose levels over the previous 2-3 months.  An oral glucose tolerance test (OGTT). This test measures your blood glucose at two times: ? After fasting. This is your baseline blood glucose level. ? Two hours after drinking a beverage that contains glucose. You may be diagnosed with type 2 diabetes if:  Your fasting blood glucose level is 126 mg/dL (7.0 mmol/L) or higher.  Your random blood glucose level is 200 mg/dL (11.1 mmol/L) or higher.  Your A1C level is 6.5% or higher.  Your oral glucose tolerance test result is higher than 200 mg/dL (11.1 mmol/L). These blood tests may be repeated to confirm your diagnosis.   How is this treated? Your treatment may be managed by a specialist called an endocrinologist. Type 2 diabetes may be treated by following instructions from your health care provider about:  Making dietary and lifestyle changes. These may include: ? Following a personalized nutrition plan that is developed by a registered dietitian. ? Exercising regularly. ? Finding ways to manage stress.  Checking your blood glucose level as often as told.  Taking diabetes medicines or insulin daily. This helps to keep your blood glucose levels in the healthy range.  Taking medicines to help prevent complications from diabetes. Medicines may include: ? Aspirin. ? Medicine to lower cholesterol. ? Medicine to control blood pressure. Your health care provider will set treatment goals for you. Your goals will be based on your age, other medical conditions you have, and how you respond to diabetes treatment. Generally, the goal of treatment is to maintain the   following blood glucose levels:  Before meals: 80-130 mg/dL (4.4-7.2 mmol/L).  After meals: below 180 mg/dL (10 mmol/L).  A1C level: less than 7%. Follow these instructions at home: Questions to ask your health care provider Consider asking the following questions:  Should I meet with a certified  diabetes care and education specialist?  What diabetes medicines do I need, and when should I take them?  What equipment will I need to manage my diabetes at home?  How often do I need to check my blood glucose?  Where can I find a support group for people with diabetes?  What number can I call if I have questions?  When is my next appointment? General instructions  Take over-the-counter and prescription medicines only as told by your health care provider.  Keep all follow-up visits as told by your health care provider. This is important. Where to find more information  American Diabetes Association (ADA): www.diabetes.org  American Association of Diabetes Care and Education Specialists (ADCES): www.diabeteseducator.org  International Diabetes Federation (IDF): www.idf.org Contact a health care provider if:  Your blood glucose is at or above 240 mg/dL (13.3 mmol/L) for 2 days in a row.  You have been sick or have had a fever for 2 days or longer, and you are not getting better.  You have any of the following problems for more than 6 hours: ? You cannot eat or drink. ? You have nausea and vomiting. ? You have diarrhea. Get help right away if:  You have severe hypoglycemia. This means your blood glucose is lower than 54 mg/dL (3.0 mmol/L).  You become confused or you have trouble thinking clearly.  You have difficulty breathing.  You have moderate or large ketone levels in your urine. These symptoms may represent a serious problem that is an emergency. Do not wait to see if the symptoms will go away. Get medical help right away. Call your local emergency services (911 in the [place] Do not drive yourself to the hospital. Summary  Type 2 diabetes (type 2 diabetes mellitus) is a long-term, or chronic, disease. In type 2 diabetes, the pancreas does not make enough of a hormone called insulin, or cells in the body do not respond properly to insulin that the body makes (insulin  resistance).  This condition is treated by making dietary and lifestyle changes and taking diabetes medicines or insulin.  Your health care provider will set treatment goals for you. Your goals will be based on your age, other medical conditions you have, and how you respond to diabetes treatment.  Keep all follow-up visits as told by your health care provider. This is important. This information is not intended to replace advice given to you by your health care provider. Make sure you discuss any questions you have with your health care provider. Document Revised: 06/02/2020 Document Reviewed: 06/02/2020 Elsevier Patient Education  2021 Elsevier Inc.  

## 2021-04-12 NOTE — Progress Notes (Signed)
Subjective:  Patient ID: Brianna Lambert, female    DOB: 1956-03-23  Age: 65 y.o. MRN: [id number]  CC: Hypertension, Diabetes, and Hyperlipidemia  This visit occurred during the SARS-CoV-2 public health emergency.  Safety protocols were in place, including screening questions prior to the visit, additional usage of staff PPE, and extensive cleaning of exam room while observing appropriate contact time as indicated for disinfecting solutions.    HPI Brianna Lambert presents for f/up -   She is very active and walks frequently.  She does not experience CP, DOE, palpitations, edema, or fatigue.  She tells me her blood sugar has been well controlled.  Outpatient Medications Prior to Visit  Medication Sig Dispense Refill  . ASPIRIN LOW DOSE 81 MG EC tablet TAKE 1 TABLET DAILY 90 tablet 3  . Cholecalciferol (VITAMIN D3) 1.25 MG (50000 UT) CAPS TAKE 1 CAPSULE ONCE WEEKLY 4 capsule 1  . levalbuterol (XOPENEX HFA) 45 MCG/ACT inhaler Inhale 1-2 puffs into the lungs every 4 (four) hours as needed. Reported on 11/05/2015 1 Inhaler 6  . atorvastatin (LIPITOR) 40 MG tablet TAKE 1 TABLET DAILY 90 tablet 1  . metFORMIN (GLUCOPHAGE-XR) 500 MG 24 hr tablet Take 2 tablets (1,000 mg total) by mouth daily with breakfast. 180 tablet 0  . montelukast (SINGULAIR) 10 MG tablet TAKE 1 TABLET AT BEDTIME 90 tablet 3  . olmesartan (BENICAR) 40 MG tablet TAKE 1 TABLET DAILY 90 tablet 0  . OZEMPIC, 0.25 OR 0.5 MG/DOSE, 2 MG/1.5ML SOPN INJECT 0.5 MG INTO THE SKIN ONCE A WEEK 4.5 mL 1  . clobetasol (OLUX) 0.05 % topical foam Apply topically 2 (two) times daily. 100 g 3  . famotidine (PEPCID) 40 MG tablet TAKE 1 TABLET DAILY 90 tablet 1   No facility-administered medications prior to visit.    ROS Review of Systems  Constitutional: Positive for unexpected weight change (wt gain). Negative for appetite change, chills, diaphoresis and fatigue.  HENT: Negative.   Eyes: Negative.   Respiratory: Negative for  cough, chest tightness, shortness of breath and wheezing.   Cardiovascular: Negative for chest pain, palpitations and leg swelling.  Gastrointestinal: Negative for abdominal pain, constipation, diarrhea and vomiting.  Endocrine: Negative.  Negative for polydipsia, polyphagia and polyuria.  Genitourinary: Negative.   Musculoskeletal: Negative.  Negative for arthralgias and myalgias.  Skin: Negative.   Neurological: Negative.  Negative for dizziness, weakness, light-headedness and headaches.  Hematological: Negative for adenopathy. Does not bruise/bleed easily.  Psychiatric/Behavioral: Negative.     Objective:  BP 128/80 (BP Location: Left Arm, Patient Position: Sitting, Cuff Size: Large)   Pulse 70   Temp 98.4 F (36.9 C) (Oral)   Ht 5\' 7"  (1.702 m)   Wt 254 lb (115.2 kg)   SpO2 97%   BMI 39.78 kg/m   BP Readings from Last 3 Encounters:  04/12/21 128/80  08/23/20 136/86  03/22/20 140/80    Wt Readings from Last 3 Encounters:  04/12/21 254 lb (115.2 kg)  08/23/20 238 lb (108 kg)  03/22/20 270 lb (122.5 kg)    Physical Exam Vitals reviewed.  Constitutional:      Appearance: She is obese.  HENT:     Nose: Nose normal.     Mouth/Throat:     Mouth: Mucous membranes are moist.  Eyes:     General: No scleral icterus.    Conjunctiva/sclera: Conjunctivae normal.  Cardiovascular:     Rate and Rhythm: Normal rate and regular rhythm.     Heart sounds:  No murmur heard.   Pulmonary:     Effort: Pulmonary effort is normal.     Breath sounds: No stridor. No wheezing, rhonchi or rales.  Abdominal:     General: Abdomen is protuberant. Bowel sounds are normal. There is no distension.     Palpations: Abdomen is soft. There is no hepatomegaly, splenomegaly or mass.     Tenderness: There is no abdominal tenderness.  Musculoskeletal:        General: Normal range of motion.     Cervical back: Neck supple.     Right lower leg: No edema.     Left lower leg: No edema.   Lymphadenopathy:     Cervical: No cervical adenopathy.  Skin:    General: Skin is warm and dry.  Neurological:     General: No focal deficit present.  Psychiatric:        Mood and Affect: Mood normal.        Behavior: Behavior normal.     Lab Results  Component Value Date   WBC 7.7 04/12/2021   HGB 13.5 04/12/2021   HCT 39.7 04/12/2021   PLT 313.0 04/12/2021   GLUCOSE 123 (H) 04/12/2021   CHOL 208 (H) 08/23/2020   TRIG 132.0 08/23/2020   HDL 42.00 08/23/2020   LDLDIRECT 155.6 07/11/2012   LDLCALC 139 (H) 08/23/2020   ALT 17 08/23/2020   AST 20 08/23/2020   NA 137 04/12/2021   K 4.4 04/12/2021   CL 104 04/12/2021   CREATININE 0.74 04/12/2021   BUN 12 04/12/2021   CO2 26 04/12/2021   TSH 0.77 08/23/2020   INR 0.97 10/27/2011   HGBA1C 6.4 04/12/2021   MICROALBUR <0.7 08/23/2020    DG Bone Density  Result Date: 11/09/2020 EXAM: DUAL X-RAY ABSORPTIOMETRY (DXA) FOR BONE MINERAL DENSITY IMPRESSION: Referring Physician:  Etta GrandchildHOMAS L Jazmon Kos Your patient completed a BMD test using Lunar IDXA DXA system ( analysis version: 16 ) manufactured by [employer]. Technologist: AW PATIENT: Name: Daleen BoBurnside, Azariyah M Patient ID: [id number] Birth Date: 1956-02-13 Height:     65.0 in. Sex: Female Measured: 11/09/2020 Weight:     235.2 lbs. Indications: Caucasian, Diabetic non insulin, Estrogen Deficient, Postmenopausal Fractures: Left wrist Treatments: Vitamin D (E933.5) ASSESSMENT: The BMD measured at Femur Neck Right is 0.940 g/cm2 with a T-score of -0.7. This patient is considered NORMAL according to World Health Organization [facility](WHO) criteria. The scan quality is good. Lumbar spine was not utilized due to advanced degenerative changes. Site Region Measured Date Measured Age YA T-score BMD Significant CHANGE DualFemur Neck Right 11/09/2020 65.3 -0.7 0.940 g/cm2 DualFemur Total Mean 11/09/2020 65.3 0.5 1.076 g/cm2 Left Forearm Radius 33% 11/09/2020 65.3 1.6 1.026 g/cm2 World Health Organization [facility](WHO)  criteria for post-menopausal, Caucasian Women: Normal       T-score at or above -1 SD Osteopenia   T-score between -1 and -2.5 SD Osteoporosis T-score at or below -2.5 SD RECOMMENDATION: 1. All patients should optimize calcium and vitamin D intake. 2. Consider FDA approved medical therapies in postmenopausal women and men aged 750 years and older, based on the following: a. A hip or vertebral (clinical or morphometric) fracture b. T-score < or = -2.5 at the femoral neck or spine after appropriate evaluation to exclude secondary causes c. Low bone mass (T-score between -1.0 and -2.5 at the femoral neck or spine) and a 10 year probability of a hip fracture > or = 3% or a 10 year probability of a major osteoporosis-related fracture > or =  20% based on the [place]-adapted WHO algorithm d. Clinician judgment and/or patient preferences may indicate treatment for people with 10-year fracture probabilities above or below these levels FOLLOW-UP: Patients with diagnosis of osteoporosis or at high risk for fracture should have regular bone mineral density tests. For patients eligible for Medicare, routine testing is allowed once every 2 years. The testing frequency can be increased to one year for patients who have rapidly progressing disease, those who are receiving or discontinuing medical therapy to restore bone mass, or have additional risk factors. I have reviewed this report and agree with the above findings. Mark A. Tyron Russell, M.D. [facility] Radiology Electronically Signed   By: Ulyses Southward M.D.   On: 11/09/2020 15:34    Assessment & Plan:   Brianna Lambert was seen today for hypertension, diabetes and hyperlipidemia.  Diagnoses and all orders for this visit:  Essential hypertension, benign- Her blood pressure is adequately well controlled. -     CBC with Differential/Platelet; Future -     Basic metabolic panel; Future -     olmesartan (BENICAR) 40 MG tablet; Take 1 tablet (40 mg total) by mouth daily. -     Basic metabolic  panel -     CBC with Differential/Platelet  Type II diabetes mellitus with manifestations -     Basic metabolic panel; Future -     Hemoglobin A1c; Future -     metFORMIN (GLUCOPHAGE-XR) 500 MG 24 hr tablet; Take 2 tablets (1,000 mg total) by mouth daily with breakfast. -     olmesartan (BENICAR) 40 MG tablet; Take 1 tablet (40 mg total) by mouth daily. -     Semaglutide, 2 MG/DOSE, (OZEMPIC, 2 MG/DOSE,) 8 MG/3ML SOPN; Inject 2 mg into the skin once a week. -     Hemoglobin A1c -     Basic metabolic panel  Hyperlipidemia with target LDL less than 100 -     atorvastatin (LIPITOR) 40 MG tablet; Take 1 tablet (40 mg total) by mouth daily.  Type 2 diabetes mellitus with complication, without long-term current use of insulin (HCC)- Her blood sugar is adequately well controlled.  I think she would benefit from a higher dose of semaglutide. -     metFORMIN (GLUCOPHAGE-XR) 500 MG 24 hr tablet; Take 2 tablets (1,000 mg total) by mouth daily with breakfast. -     Semaglutide, 2 MG/DOSE, (OZEMPIC, 2 MG/DOSE,) 8 MG/3ML SOPN; Inject 2 mg into the skin once a week.  Asthma in remission -     montelukast (SINGULAIR) 10 MG tablet; Take 1 tablet (10 mg total) by mouth at bedtime.  Other orders -     Varicella-zoster vaccine IM (Shingrix) -     Tdap vaccine greater than or equal to 7yo IM   I have discontinued Vena M. Forgette's clobetasol, famotidine, and Ozempic (0.25 or 0.5 MG/DOSE). I have also changed her atorvastatin, olmesartan, and montelukast. Additionally, I am having her start on Ozempic (2 MG/DOSE). Lastly, I am having her maintain her levalbuterol, Aspirin Low Dose, Vitamin D3, and metFORMIN.  Meds ordered this encounter  Medications  . atorvastatin (LIPITOR) 40 MG tablet    Sig: Take 1 tablet (40 mg total) by mouth daily.    Dispense:  90 tablet    Refill:  1  . metFORMIN (GLUCOPHAGE-XR) 500 MG 24 hr tablet    Sig: Take 2 tablets (1,000 mg total) by mouth daily with breakfast.     Dispense:  180 tablet    Refill:  1  . olmesartan (BENICAR) 40 MG tablet    Sig: Take 1 tablet (40 mg total) by mouth daily.    Dispense:  90 tablet    Refill:  1  . montelukast (SINGULAIR) 10 MG tablet    Sig: Take 1 tablet (10 mg total) by mouth at bedtime.    Dispense:  90 tablet    Refill:  1  . Semaglutide, 2 MG/DOSE, (OZEMPIC, 2 MG/DOSE,) 8 MG/3ML SOPN    Sig: Inject 2 mg into the skin once a week.    Dispense:  9 mL    Refill:  1     Follow-up: Return in about 4 months (around 08/13/2021).  Sanda Linger, MD

## 2021-06-08 ENCOUNTER — Ambulatory Visit: Payer: Managed Care, Other (non HMO)

## 2021-06-08 ENCOUNTER — Telehealth: Payer: Self-pay

## 2021-06-08 NOTE — Telephone Encounter (Signed)
Last Shingrix 04/12/21.  2nd dose needs to be at least 8mo from 1st; appt needs to be rescheduled to at least 7/24 or after. LVM instructing pt to call office to reschedule.  Will also send mychart message.

## 2021-06-17 ENCOUNTER — Other Ambulatory Visit: Payer: Self-pay

## 2021-06-17 ENCOUNTER — Ambulatory Visit (INDEPENDENT_AMBULATORY_CARE_PROVIDER_SITE_OTHER): Payer: Managed Care, Other (non HMO)

## 2021-06-17 DIAGNOSIS — Z23 Encounter for immunization: Secondary | ICD-10-CM | POA: Diagnosis not present

## 2021-06-28 ENCOUNTER — Other Ambulatory Visit: Payer: Self-pay | Admitting: Internal Medicine

## 2021-06-28 DIAGNOSIS — E559 Vitamin D deficiency, unspecified: Secondary | ICD-10-CM

## 2021-07-05 LAB — HM DIABETES EYE EXAM

## 2021-09-15 ENCOUNTER — Encounter: Payer: Self-pay | Admitting: Internal Medicine

## 2021-09-15 ENCOUNTER — Other Ambulatory Visit: Payer: Self-pay

## 2021-09-15 ENCOUNTER — Other Ambulatory Visit: Payer: Self-pay | Admitting: Internal Medicine

## 2021-09-15 ENCOUNTER — Other Ambulatory Visit: Payer: Self-pay | Admitting: Family Medicine

## 2021-09-15 ENCOUNTER — Ambulatory Visit (INDEPENDENT_AMBULATORY_CARE_PROVIDER_SITE_OTHER): Payer: Managed Care, Other (non HMO) | Admitting: Internal Medicine

## 2021-09-15 VITALS — BP 138/78 | HR 66 | Temp 98.2°F | Resp 16 | Ht 67.0 in | Wt 250.0 lb

## 2021-09-15 DIAGNOSIS — Z1231 Encounter for screening mammogram for malignant neoplasm of breast: Secondary | ICD-10-CM

## 2021-09-15 DIAGNOSIS — E118 Type 2 diabetes mellitus with unspecified complications: Secondary | ICD-10-CM

## 2021-09-15 DIAGNOSIS — E785 Hyperlipidemia, unspecified: Secondary | ICD-10-CM

## 2021-09-15 DIAGNOSIS — I10 Essential (primary) hypertension: Secondary | ICD-10-CM

## 2021-09-15 DIAGNOSIS — Z23 Encounter for immunization: Secondary | ICD-10-CM | POA: Diagnosis not present

## 2021-09-15 DIAGNOSIS — M150 Primary generalized (osteo)arthritis: Secondary | ICD-10-CM

## 2021-09-15 DIAGNOSIS — M159 Polyosteoarthritis, unspecified: Secondary | ICD-10-CM

## 2021-09-15 DIAGNOSIS — Z0001 Encounter for general adult medical examination with abnormal findings: Secondary | ICD-10-CM | POA: Diagnosis not present

## 2021-09-15 DIAGNOSIS — E559 Vitamin D deficiency, unspecified: Secondary | ICD-10-CM | POA: Diagnosis not present

## 2021-09-15 DIAGNOSIS — Z832 Family history of diseases of the blood and blood-forming organs and certain disorders involving the immune mechanism: Secondary | ICD-10-CM

## 2021-09-15 LAB — URINALYSIS, ROUTINE W REFLEX MICROSCOPIC
Bilirubin Urine: NEGATIVE
Hgb urine dipstick: NEGATIVE
Ketones, ur: NEGATIVE
Leukocytes,Ua: NEGATIVE
Nitrite: NEGATIVE
Specific Gravity, Urine: <=1.005 — AB (ref 1.000–1.030)
Total Protein, Urine: NEGATIVE
Urine Glucose: NEGATIVE
Urobilinogen, UA: 0.2 (ref 0.0–1.0)
pH: 6 (ref 5.0–8.0)

## 2021-09-15 LAB — MICROALBUMIN / CREATININE URINE RATIO
Creatinine,U: 23.9 mg/dL
Microalb Creat Ratio: 2.9 mg/g (ref 0.0–30.0)
Microalb, Ur: <0.7 mg/dL (ref 0.0–1.9)

## 2021-09-15 LAB — CBC WITH DIFFERENTIAL/PLATELET
Basophils Absolute: 0.1 10*3/uL (ref 0.0–0.1)
Basophils Relative: 0.9 % (ref 0.0–3.0)
Eosinophils Absolute: 0.1 10*3/uL (ref 0.0–0.7)
Eosinophils Relative: 1.2 % (ref 0.0–5.0)
HCT: 39.9 % (ref 36.0–46.0)
Hemoglobin: 13.4 g/dL (ref 12.0–15.0)
Lymphocytes Relative: 31.6 % (ref 12.0–46.0)
Lymphs Abs: 2.3 10*3/uL (ref 0.7–4.0)
MCHC: 33.5 g/dL (ref 30.0–36.0)
MCV: 91.3 fl (ref 78.0–100.0)
Monocytes Absolute: 0.6 10*3/uL (ref 0.1–1.0)
Monocytes Relative: 8.5 % (ref 3.0–12.0)
Neutro Abs: 4.1 10*3/uL (ref 1.4–7.7)
Neutrophils Relative %: 57.8 % (ref 43.0–77.0)
Platelets: 318 10*3/uL (ref 150.0–400.0)
RBC: 4.37 Mil/uL (ref 3.87–5.11)
RDW: 13.1 % (ref 11.5–15.5)
WBC: 7.1 10*3/uL (ref 4.0–10.5)

## 2021-09-15 LAB — LIPID PANEL
Cholesterol: 222 mg/dL — ABNORMAL HIGH (ref 0–200)
HDL: 49.5 mg/dL (ref >39.00)
LDL Cholesterol: 144 mg/dL — ABNORMAL HIGH (ref 0–99)
NonHDL: 172.16
Total CHOL/HDL Ratio: 4
Triglycerides: 140 mg/dL (ref 0.0–149.0)
VLDL: 28 mg/dL (ref 0.0–40.0)

## 2021-09-15 LAB — HEPATIC FUNCTION PANEL
ALT: 14 U/L (ref 0–35)
AST: 17 U/L (ref 0–37)
Albumin: 4.1 g/dL (ref 3.5–5.2)
Alkaline Phosphatase: 42 U/L (ref 39–117)
Bilirubin, Direct: 0.1 mg/dL (ref 0.0–0.3)
Total Bilirubin: 0.5 mg/dL (ref 0.2–1.2)
Total Protein: 7.1 g/dL (ref 6.0–8.3)

## 2021-09-15 LAB — BASIC METABOLIC PANEL
BUN: 15 mg/dL (ref 6–23)
CO2: 29 mEq/L (ref 19–32)
Calcium: 9.2 mg/dL (ref 8.4–10.5)
Chloride: 101 mEq/L (ref 96–112)
Creatinine, Ser: 0.82 mg/dL (ref 0.40–1.20)
GFR: 74.66 mL/min (ref >60.00)
Glucose, Bld: 158 mg/dL — ABNORMAL HIGH (ref 70–99)
Potassium: 4 mEq/L (ref 3.5–5.1)
Sodium: 137 mEq/L (ref 135–145)

## 2021-09-15 LAB — HEMOGLOBIN A1C: Hgb A1c MFr Bld: 6.1 % (ref 4.6–6.5)

## 2021-09-15 LAB — TSH: TSH: 0.45 u[IU]/mL (ref 0.35–5.50)

## 2021-09-15 LAB — VITAMIN D 25 HYDROXY (VIT D DEFICIENCY, FRACTURES): VITD: 52.33 ng/mL (ref 30.00–100.00)

## 2021-09-15 MED ORDER — CELECOXIB 100 MG PO CAPS: 100.0000 mg | ORAL_CAPSULE | Freq: Two times a day (BID) | ORAL | 1 refills | Status: DC

## 2021-09-15 MED ORDER — OLMESARTAN MEDOXOMIL 40 MG PO TABS
40.0000 mg | ORAL_TABLET | Freq: Every day | ORAL | 1 refills | Status: DC
Start: 2021-09-15 — End: 2022-05-23

## 2021-09-15 MED ORDER — ATORVASTATIN CALCIUM 40 MG PO TABS: 40.0000 mg | ORAL_TABLET | Freq: Every day | ORAL | 1 refills | Status: DC

## 2021-09-15 MED ORDER — OZEMPIC (2 MG/DOSE) 8 MG/3ML ~~LOC~~ SOPN
2.0000 mg | PEN_INJECTOR | SUBCUTANEOUS | 1 refills | Status: DC
Start: 2021-09-15 — End: 2021-12-17

## 2021-09-15 NOTE — Progress Notes (Signed)
Subjective:  Patient ID: Brianna Lambert, female    DOB: 1956/08/29  Age: 65 y.o. MRN: [id number]  CC: Annual Exam, Osteoarthritis, Hypertension, and Hyperlipidemia  This visit occurred during the SARS-CoV-2 public health emergency.  Safety protocols were in place, including screening questions prior to the visit, additional usage of staff PPE, and extensive cleaning of exam room while observing appropriate contact time as indicated for disinfecting solutions.    HPI Brianna Lambert presents for a CPX and f/up -   She complains of chronic, and somewhat worsening, pain in her hips and knees.  The symptoms are symmetrical.  She never notices any joint swelling.  Her small joints are not involved.  She has not gotten much symptom relief with the combination of Advil and Tylenol.  She is active and denies chest pain, shortness of breath, palpitations, edema, or fatigue.  She has been informed that 3 of her cousins have had thromboembolic events related to factor V Leiden.  She would like to be screened for this.  Outpatient Medications Prior to Visit  Medication Sig Dispense Refill   ASPIRIN LOW DOSE 81 MG EC tablet TAKE 1 TABLET DAILY 90 tablet 3   Cholecalciferol (VITAMIN D3) 1.25 MG (50000 UT) CAPS TAKE 1 CAPSULE ONCE WEEKLY 4 capsule 1   levalbuterol (XOPENEX HFA) 45 MCG/ACT inhaler Inhale 1-2 puffs into the lungs every 4 (four) hours as needed. Reported on 11/05/2015 1 Inhaler 6   metFORMIN (GLUCOPHAGE-XR) 500 MG 24 hr tablet Take 2 tablets (1,000 mg total) by mouth daily with breakfast. 180 tablet 1   montelukast (SINGULAIR) 10 MG tablet Take 1 tablet (10 mg total) by mouth at bedtime. 90 tablet 1   atorvastatin (LIPITOR) 40 MG tablet Take 1 tablet (40 mg total) by mouth daily. 90 tablet 1   olmesartan (BENICAR) 40 MG tablet Take 1 tablet (40 mg total) by mouth daily. 90 tablet 1   Semaglutide, 2 MG/DOSE, (OZEMPIC, 2 MG/DOSE,) 8 MG/3ML SOPN Inject 2 mg into the skin once a week. 9 mL  1   No facility-administered medications prior to visit.    ROS Review of Systems  Constitutional:  Negative for diaphoresis, fatigue and unexpected weight change.  HENT: Negative.    Eyes: Negative.   Respiratory:  Negative for cough, chest tightness, shortness of breath and wheezing.   Cardiovascular:  Negative for chest pain, palpitations and leg swelling.  Gastrointestinal:  Negative for abdominal pain, constipation, diarrhea, nausea and vomiting.  Endocrine: Negative.   Genitourinary: Negative.  Negative for difficulty urinating, dysuria, hematuria and urgency.  Musculoskeletal:  Positive for arthralgias. Negative for back pain, myalgias and neck pain.  Skin: Negative.  Negative for color change.  Neurological: Negative.  Negative for dizziness.  Hematological:  Negative for adenopathy. Does not bruise/bleed easily.  Psychiatric/Behavioral: Negative.     Objective:  BP 138/78 (BP Location: Right Arm, Patient Position: Sitting, Cuff Size: Large)   Pulse 66   Temp 98.2 F (36.8 C) (Oral)   Resp 16   Ht 5\' 7"  (1.702 m)   Wt 250 lb (113.4 kg)   SpO2 97%   BMI 39.16 kg/m   BP Readings from Last 3 Encounters:  09/15/21 138/78  04/12/21 128/80  08/23/20 136/86    Wt Readings from Last 3 Encounters:  09/15/21 250 lb (113.4 kg)  04/12/21 254 lb (115.2 kg)  08/23/20 238 lb (108 kg)    Physical Exam  Lab Results  Component Value Date   WBC  7.1 09/15/2021   HGB 13.4 09/15/2021   HCT 39.9 09/15/2021   PLT 318.0 09/15/2021   GLUCOSE 158 (H) 09/15/2021   CHOL 222 (H) 09/15/2021   TRIG 140.0 09/15/2021   HDL 49.50 09/15/2021   LDLDIRECT 155.6 07/11/2012   LDLCALC 144 (H) 09/15/2021   ALT 14 09/15/2021   AST 17 09/15/2021   NA 137 09/15/2021   K 4.0 09/15/2021   CL 101 09/15/2021   CREATININE 0.82 09/15/2021   BUN 15 09/15/2021   CO2 29 09/15/2021   TSH 0.45 09/15/2021   INR 0.97 10/27/2011   HGBA1C 6.1 09/15/2021   MICROALBUR <0.7 09/15/2021    DG Bone  Density  Result Date: 11/09/2020 EXAM: DUAL X-RAY ABSORPTIOMETRY (DXA) FOR BONE MINERAL DENSITY IMPRESSION: Referring Physician:  Etta Grandchild Your patient completed a BMD test using Lunar IDXA DXA system ( analysis version: 16 ) manufactured by [employer]. Technologist: AW PATIENT: Name: Brianna Lambert, Brianna Lambert Patient ID: [id number] Birth Date: [birth year] Height:     65.0 in. Sex: Female Measured: 11/09/2020 Weight:     235.2 lbs. Indications: Caucasian, Diabetic non insulin, Estrogen Deficient, Postmenopausal Fractures: Left wrist Treatments: Vitamin D (E933.5) ASSESSMENT: The BMD measured at Femur Neck Right is 0.940 g/cm2 with a T-score of -0.7. This patient is considered NORMAL according to World Health Organization [facility]) criteria. The scan quality is good. Lumbar spine was not utilized due to advanced degenerative changes. Site Region Measured Date Measured Age YA T-score BMD Significant CHANGE DualFemur Neck Right 11/09/2020 65.3 -0.7 0.940 g/cm2 DualFemur Total Mean 11/09/2020 65.3 0.5 1.076 g/cm2 Left Forearm Radius 33% 11/09/2020 65.3 1.6 1.026 g/cm2 World Health Organization [facility]) criteria for post-menopausal, Caucasian Women: Normal       T-score at or above -1 SD Osteopenia   T-score between -1 and -2.5 SD Osteoporosis T-score at or below -2.5 SD RECOMMENDATION: 1. All patients should optimize calcium and vitamin D intake. 2. Consider FDA approved medical therapies in postmenopausal women and men aged 75 years and older, based on the following: a. A hip or vertebral (clinical or morphometric) fracture b. T-score < or = -2.5 at the femoral neck or spine after appropriate evaluation to exclude secondary causes c. Low bone mass (T-score between -1.0 and -2.5 at the femoral neck or spine) and a 10 year probability of a hip fracture > or = 3% or a 10 year probability of a major osteoporosis-related fracture > or = 20% based on the [place]-adapted WHO algorithm d. Clinician judgment and/or patient preferences may  indicate treatment for people with 10-year fracture probabilities above or below these levels FOLLOW-UP: Patients with diagnosis of osteoporosis or at high risk for fracture should have regular bone mineral density tests. For patients eligible for Medicare, routine testing is allowed once every 2 years. The testing frequency can be increased to one year for patients who have rapidly progressing disease, those who are receiving or discontinuing medical therapy to restore bone mass, or have additional risk factors. I have reviewed this report and agree with the above findings. Mark A. Tyron Russell, M.D. [facility] Radiology Electronically Signed   By: Ulyses Southward M.D.   On: 11/09/2020 15:34    Assessment & Plan:   Amika was seen today for annual exam, osteoarthritis, hypertension and hyperlipidemia.  Diagnoses and all orders for this visit:  Vitamin D deficiency disease- Her vitamin D level is normal. -     VITAMIN D 25 Hydroxy (Vit-D Deficiency, Fractures); Future -  VITAMIN D 25 Hydroxy (Vit-D Deficiency, Fractures)  Hyperlipidemia with target LDL less than 100- LDL goal achieved. Doing well on the statin  -     atorvastatin (LIPITOR) 40 MG tablet; Take 1 tablet (40 mg total) by mouth daily. -     Lipid panel; Future -     TSH; Future -     Hepatic function panel; Future -     Hepatic function panel -     TSH -     Lipid panel  Essential hypertension, benign- Her blood pressure is adequately well controlled. -     olmesartan (BENICAR) 40 MG tablet; Take 1 tablet (40 mg total) by mouth daily. -     CBC with Differential/Platelet; Future -     Basic metabolic panel; Future -     Urinalysis, Routine w reflex microscopic; Future -     TSH; Future -     TSH -     Urinalysis, Routine w reflex microscopic -     Basic metabolic panel -     CBC with Differential/Platelet  Type II diabetes mellitus with manifestations- Her blood sugar is adequately well controlled.  Will continue the combination of  metformin and a GLP-1 agonist. -     olmesartan (BENICAR) 40 MG tablet; Take 1 tablet (40 mg total) by mouth daily. -     Semaglutide, 2 MG/DOSE, (OZEMPIC, 2 MG/DOSE,) 8 MG/3ML SOPN; Inject 2 mg into the skin once a week. -     Basic metabolic panel; Future -     Microalbumin / creatinine urine ratio; Future -     Hemoglobin A1c; Future -     HM Diabetes Foot Exam -     Hemoglobin A1c -     Microalbumin / creatinine urine ratio -     Basic metabolic panel  Type 2 diabetes mellitus with complication, without long-term current use of insulin (HCC) -     Semaglutide, 2 MG/DOSE, (OZEMPIC, 2 MG/DOSE,) 8 MG/3ML SOPN; Inject 2 mg into the skin once a week. -     Basic metabolic panel; Future -     Microalbumin / creatinine urine ratio; Future -     Hemoglobin A1c; Future -     HM Diabetes Foot Exam -     Hemoglobin A1c -     Microalbumin / creatinine urine ratio -     Basic metabolic panel  Flu vaccine need -     Flu Vaccine QUAD High Dose(Fluad)  Family history of hypercoagulability -     Factor 5 leiden; Future -     Factor 5 leiden  Primary osteoarthritis involving multiple joints -     celecoxib (CELEBREX) 100 MG capsule; Take 1 capsule (100 mg total) by mouth 2 (two) times daily.  Encounter for general adult medical examination with abnormal findings- Exam completed, labs reviewed, vaccines reviewed and updated, cancer screenings are up-to-date, patient education was given.  I am having Daleen Bo start on celecoxib. I am also having her maintain her levalbuterol, Aspirin Low Dose, metFORMIN, montelukast, Vitamin D3, atorvastatin, olmesartan, and Ozempic (2 MG/DOSE).  Meds ordered this encounter  Medications   atorvastatin (LIPITOR) 40 MG tablet    Sig: Take 1 tablet (40 mg total) by mouth daily.    Dispense:  90 tablet    Refill:  1   olmesartan (BENICAR) 40 MG tablet    Sig: Take 1 tablet (40 mg total) by mouth daily.    Dispense:  90 tablet    Refill:  1    Semaglutide, 2 MG/DOSE, (OZEMPIC, 2 MG/DOSE,) 8 MG/3ML SOPN    Sig: Inject 2 mg into the skin once a week.    Dispense:  9 mL    Refill:  1   celecoxib (CELEBREX) 100 MG capsule    Sig: Take 1 capsule (100 mg total) by mouth 2 (two) times daily.    Dispense:  180 capsule    Refill:  1      Follow-up: Return in about 6 months (around 03/16/2022).  Sanda Linger, MD

## 2021-09-15 NOTE — Patient Instructions (Signed)
Health Maintenance, Female Adopting a healthy lifestyle and getting preventive care are important in promoting health and wellness. Ask your health care provider about: The right schedule for you to have regular tests and exams. Things you can do on your own to prevent diseases and keep yourself healthy. What should I know about diet, weight, and exercise? Eat a healthy diet  Eat a diet that includes plenty of vegetables, fruits, low-fat dairy products, and lean protein. Do not eat a lot of foods that are high in solid fats, added sugars, or sodium. Maintain a healthy weight Body mass index (BMI) is used to identify weight problems. It estimates body fat based on height and weight. Your health care provider can help determine your BMI and help you achieve or maintain a healthy weight. Get regular exercise Get regular exercise. This is one of the most important things you can do for your health. Most adults should: Exercise for at least 150 minutes each week. The exercise should increase your heart rate and make you sweat (moderate-intensity exercise). Do strengthening exercises at least twice a week. This is in addition to the moderate-intensity exercise. Spend less time sitting. Even light physical activity can be beneficial. Watch cholesterol and blood lipids Have your blood tested for lipids and cholesterol at 66 years of age, then have this test every 5 years. Have your cholesterol levels checked more often if: Your lipid or cholesterol levels are high. You are older than 66 years of age. You are at high risk for heart disease. What should I know about cancer screening? Depending on your health history and family history, you may need to have cancer screening at various ages. This may include screening for: Breast cancer. Cervical cancer. Colorectal cancer. Skin cancer. Lung cancer. What should I know about heart disease, diabetes, and high blood pressure? Blood pressure and heart  disease High blood pressure causes heart disease and increases the risk of stroke. This is more likely to develop in people who have high blood pressure readings, are of African descent, or are overweight. Have your blood pressure checked: Every 3-5 years if you are 18-39 years of age. Every year if you are 40 years old or older. Diabetes Have regular diabetes screenings. This checks your fasting blood sugar level. Have the screening done: Once every three years after age 40 if you are at a normal weight and have a low risk for diabetes. More often and at a younger age if you are overweight or have a high risk for diabetes. What should I know about preventing infection? Hepatitis B If you have a higher risk for hepatitis B, you should be screened for this virus. Talk with your health care provider to find out if you are at risk for hepatitis B infection. Hepatitis C Testing is recommended for: Everyone born from 1945 through 1965. Anyone with known risk factors for hepatitis C. Sexually transmitted infections (STIs) Get screened for STIs, including gonorrhea and chlamydia, if: You are sexually active and are younger than 66 years of age. You are older than 66 years of age and your health care provider tells you that you are at risk for this type of infection. Your sexual activity has changed since you were last screened, and you are at increased risk for chlamydia or gonorrhea. Ask your health care provider if you are at risk. Ask your health care provider about whether you are at high risk for HIV. Your health care provider may recommend a prescription medicine   to help prevent HIV infection. If you choose to take medicine to prevent HIV, you should first get tested for HIV. You should then be tested every 3 months for as long as you are taking the medicine. Pregnancy If you are about to stop having your period (premenopausal) and you may become pregnant, seek counseling before you get  pregnant. Take 400 to 800 micrograms (mcg) of folic acid every day if you become pregnant. Ask for birth control (contraception) if you want to prevent pregnancy. Osteoporosis and menopause Osteoporosis is a disease in which the bones lose minerals and strength with aging. This can result in bone fractures. If you are 65 years old or older, or if you are at risk for osteoporosis and fractures, ask your health care provider if you should: Be screened for bone loss. Take a calcium or vitamin D supplement to lower your risk of fractures. Be given hormone replacement therapy (HRT) to treat symptoms of menopause. Follow these instructions at home: Lifestyle Do not use any products that contain nicotine or tobacco, such as cigarettes, e-cigarettes, and chewing tobacco. If you need help quitting, ask your health care provider. Do not use street drugs. Do not share needles. Ask your health care provider for help if you need support or information about quitting drugs. Alcohol use Do not drink alcohol if: Your health care provider tells you not to drink. You are pregnant, may be pregnant, or are planning to become pregnant. If you drink alcohol: Limit how much you use to 0-1 drink a day. Limit intake if you are breastfeeding. Be aware of how much alcohol is in your drink. In the [place] one drink equals one 12 oz bottle of beer (355 mL), one 5 oz glass of wine (148 mL), or one 1 oz glass of hard liquor (44 mL). General instructions Schedule regular health, dental, and eye exams. Stay current with your vaccines. Tell your health care provider if: You often feel depressed. You have ever been abused or do not feel safe at home. Summary Adopting a healthy lifestyle and getting preventive care are important in promoting health and wellness. Follow your health care provider's instructions about healthy diet, exercising, and getting tested or screened for diseases. Follow your health care provider's  instructions on monitoring your cholesterol and blood pressure. This information is not intended to replace advice given to you by your health care provider. Make sure you discuss any questions you have with your health care provider. Document Revised: 01/14/2021 Document Reviewed: 10/30/2018 Elsevier Patient Education  2022 Elsevier Inc.  

## 2021-09-16 DIAGNOSIS — Z0001 Encounter for general adult medical examination with abnormal findings: Secondary | ICD-10-CM | POA: Insufficient documentation

## 2021-09-21 ENCOUNTER — Other Ambulatory Visit: Payer: Self-pay | Admitting: Internal Medicine

## 2021-09-21 DIAGNOSIS — E118 Type 2 diabetes mellitus with unspecified complications: Secondary | ICD-10-CM

## 2021-09-22 ENCOUNTER — Other Ambulatory Visit: Payer: Self-pay | Admitting: Internal Medicine

## 2021-09-22 ENCOUNTER — Telehealth: Payer: Self-pay | Admitting: Internal Medicine

## 2021-09-22 DIAGNOSIS — E559 Vitamin D deficiency, unspecified: Secondary | ICD-10-CM

## 2021-09-22 DIAGNOSIS — M159 Polyosteoarthritis, unspecified: Secondary | ICD-10-CM

## 2021-09-22 MED ORDER — NABUMETONE 500 MG PO TABS: 500.0000 mg | ORAL_TABLET | Freq: Two times a day (BID) | ORAL | 1 refills | Status: DC | PRN

## 2021-09-22 NOTE — Telephone Encounter (Signed)
[facility] states a alternative medication has to be offered before rx  celecoxib (CELEBREX) 100 MG capsule can be filled  Representative states the alternative medications are naproxen tablets 250/375/500 mg and meloxicam tablets 7.5/15 mg  Representative is requesting a call back for prior authorization of alternative medication   Ref [id number]  Phone [phone]

## 2021-09-25 LAB — FACTOR 5 LEIDEN: Result: NEGATIVE

## 2021-11-10 ENCOUNTER — Ambulatory Visit
Admission: RE | Admit: 2021-11-10 | Discharge: 2021-11-10 | Disposition: A | Discharge status: Home / Self Care | Payer: Managed Care, Other (non HMO) | Source: Ambulatory Visit

## 2021-11-10 DIAGNOSIS — Z1231 Encounter for screening mammogram for malignant neoplasm of breast: Secondary | ICD-10-CM

## 2021-12-16 ENCOUNTER — Encounter: Payer: Self-pay | Admitting: Nurse Practitioner

## 2021-12-16 ENCOUNTER — Encounter: Payer: Self-pay | Admitting: Internal Medicine

## 2021-12-16 ENCOUNTER — Other Ambulatory Visit: Payer: Self-pay

## 2021-12-16 ENCOUNTER — Ambulatory Visit: Payer: Managed Care, Other (non HMO) | Admitting: Nurse Practitioner

## 2021-12-16 VITALS — BP 150/78 | HR 56 | Temp 98.1°F | Ht 67.0 in | Wt 259.6 lb

## 2021-12-16 DIAGNOSIS — J019 Acute sinusitis, unspecified: Secondary | ICD-10-CM

## 2021-12-16 MED ORDER — AMOXICILLIN-POT CLAVULANATE 875-125 MG PO TABS: 1.0000 | ORAL_TABLET | Freq: Two times a day (BID) | ORAL | 0 refills | Status: DC

## 2021-12-16 MED ORDER — FLUTICASONE PROPIONATE 50 MCG/ACT NA SUSP: 2.0000 | Freq: Every day | NASAL | 6 refills | Status: DC

## 2021-12-16 NOTE — Progress Notes (Signed)
Subjective:  Patient ID: Brianna Lambert, female    DOB: May 11, 1956  Age: 66 y.o. MRN: [id number]  CC:  Chief Complaint  Patient presents with   Sinus Problem      HPI  This patient arrives today for the above.  Symptoms started 1 month ago.  They began progressively worse.  Originally started with nasal congestion, now progressed to dry cough, low-grade fevers, continue nasal congestion, and shortness of breath intermittently.  She has taken COVID test and was negative.  Last time she tested was a couple of days ago.  She has tried over-the-counter Mucinex and over-the-counter decongestants.  These have helped her symptoms but only mildly.  Past Medical History:  Diagnosis Date   Allergy    Arthritis    knees and spine   Asthma    environmental allergies   Diabetes mellitus without complication (HCC)    GERD (gastroesophageal reflux disease)    Hiatal hernia    Hyperlipidemia    Hypertension    Low back pain radiating to both legs 2012   Dr. Electa Sniff   Lumbar pain with radiation down right leg    PONV (postoperative nausea and vomiting)       Family History  Problem Relation Age of Onset   Heart disease Father    Diabetes Father    Cancer Neg Hx    Anesthesia problems Neg Hx    Hypotension Neg Hx    Malignant hyperthermia Neg Hx    Pseudochol deficiency Neg Hx    Colon cancer Neg Hx    Esophageal cancer Neg Hx    Stomach cancer Neg Hx    Rectal cancer Neg Hx     Social History   Social History Narrative   Not on file   Social History   Tobacco Use   Smoking status: Never   Smokeless tobacco: Never  Substance Use Topics   Alcohol use: No    Alcohol/week: 0.0 standard drinks     Current Meds  Medication Sig   amoxicillin-clavulanate (AUGMENTIN) 875-125 MG tablet Take 1 tablet by mouth 2 (two) times daily.   atorvastatin (LIPITOR) 40 MG tablet Take 1 tablet (40 mg total) by mouth daily.   Cholecalciferol (VITAMIN D3) 1.25 MG (50000 UT)  CAPS TAKE 1 CAPSULE ONCE WEEKLY   fluticasone (FLONASE) 50 MCG/ACT nasal spray Place 2 sprays into both nostrils daily.   levalbuterol (XOPENEX HFA) 45 MCG/ACT inhaler Inhale 1-2 puffs into the lungs every 4 (four) hours as needed. Reported on 11/05/2015   metFORMIN (GLUCOPHAGE-XR) 500 MG 24 hr tablet TAKE 2 TABLETS DAILY WITH BREAKFAST   montelukast (SINGULAIR) 10 MG tablet Take 1 tablet (10 mg total) by mouth at bedtime.   nabumetone (RELAFEN) 500 MG tablet Take 1 tablet (500 mg total) by mouth 2 (two) times daily as needed.   olmesartan (BENICAR) 40 MG tablet Take 1 tablet (40 mg total) by mouth daily.   Semaglutide, 2 MG/DOSE, (OZEMPIC, 2 MG/DOSE,) 8 MG/3ML SOPN Inject 2 mg into the skin once a week.    ROS:  Review of Systems  Constitutional:  Positive for fever (low grade when she has checked it (around 100)) and malaise/fatigue.  HENT:  Positive for congestion, nosebleeds and sinus pain.   Respiratory:  Positive for cough and shortness of breath (intermittently, seem worse with the congestion). Negative for sputum production.   Cardiovascular:  Negative for chest pain.  Gastrointestinal:  Negative for diarrhea, nausea and vomiting.  Neurological:  Positive for headaches.    Objective:   Today's Vitals: BP (!) 150/78 (BP Location: Left Arm, Patient Position: Sitting, Cuff Size: Normal)    Pulse (!) 56    Temp 98.1 F (36.7 C) (Oral)    Ht 5\' 7"  (1.702 m)    Wt 259 lb 9.6 oz (117.8 kg)    SpO2 98%    BMI 40.66 kg/m  Vitals with BMI 12/16/2021 09/15/2021 04/12/2021  Height 5\' 7"  5\' 7"  5\' 7"   Weight 259 lbs 10 oz 250 lbs 254 lbs  BMI 40.65 39.15 39.77  Systolic 150 138 04/14/2021  Diastolic 78 78 80  Pulse 56 66 70     Physical Exam Vitals reviewed.  Constitutional:      General: She is not in acute distress.    Appearance: Normal appearance.  HENT:     Head: Normocephalic and atraumatic.  Neck:     Vascular: No carotid bruit.  Cardiovascular:     Rate and Rhythm: Normal rate  and regular rhythm.     Pulses: Normal pulses.     Heart sounds: Normal heart sounds.  Pulmonary:     Effort: Pulmonary effort is normal.     Breath sounds: Normal breath sounds.  Skin:    General: Skin is warm and dry.  Neurological:     General: No focal deficit present.     Mental Status: She is alert and oriented to person, place, and time.  Psychiatric:        Mood and Affect: Mood normal.        Behavior: Behavior normal.        Judgment: Judgment normal.         Assessment and Plan   1. Acute non-recurrent sinusitis, unspecified location      Plan: 1.  Luckily on clinical exam patient does not appear significantly ill.  However, because symptoms are getting progressively worse and not improving we will treat with antibiotics.  Augmentin and Flonase prescribed today.  Patient told to return to office if symptoms continue to get worse or do not improve.  She was told to continue using her Mucinex over-the-counter for symptom management.   Tests ordered No orders of the defined types were placed in this encounter.     Meds ordered this encounter  Medications   amoxicillin-clavulanate (AUGMENTIN) 875-125 MG tablet    Sig: Take 1 tablet by mouth 2 (two) times daily.    Dispense:  20 tablet    Refill:  0    Order Specific Question:   Supervising Provider    Answer:   BURNS, STACY J [[id number]]   fluticasone (FLONASE) 50 MCG/ACT nasal spray    Sig: Place 2 sprays into both nostrils daily.    Dispense:  16 g    Refill:  6    Order Specific Question:   Supervising Provider    Answer:       Patient to follow-up with PCP when due, or sooner as needed.  , NP

## 2021-12-17 ENCOUNTER — Other Ambulatory Visit: Payer: Self-pay | Admitting: Internal Medicine

## 2021-12-17 DIAGNOSIS — E118 Type 2 diabetes mellitus with unspecified complications: Secondary | ICD-10-CM

## 2021-12-17 MED ORDER — TIRZEPATIDE 5 MG/0.5ML ~~LOC~~ SOAJ: 5.0000 mg | SUBCUTANEOUS | 0 refills | Status: DC

## 2021-12-19 ENCOUNTER — Other Ambulatory Visit: Payer: Self-pay | Admitting: Internal Medicine

## 2022-01-02 ENCOUNTER — Other Ambulatory Visit: Payer: Self-pay | Admitting: Internal Medicine

## 2022-01-02 DIAGNOSIS — J45998 Other asthma: Secondary | ICD-10-CM

## 2022-03-01 ENCOUNTER — Telehealth: Payer: Self-pay

## 2022-03-01 NOTE — Telephone Encounter (Signed)
Key: [id number] ?Approved ?01/30/2022 - 03/01/2023 ?

## 2022-03-03 ENCOUNTER — Other Ambulatory Visit: Payer: Self-pay | Admitting: Internal Medicine

## 2022-03-03 DIAGNOSIS — M159 Polyosteoarthritis, unspecified: Secondary | ICD-10-CM

## 2022-03-15 ENCOUNTER — Other Ambulatory Visit: Payer: Self-pay | Admitting: Internal Medicine

## 2022-03-15 DIAGNOSIS — E785 Hyperlipidemia, unspecified: Secondary | ICD-10-CM

## 2022-03-20 ENCOUNTER — Other Ambulatory Visit: Payer: Self-pay | Admitting: Internal Medicine

## 2022-03-20 DIAGNOSIS — E118 Type 2 diabetes mellitus with unspecified complications: Secondary | ICD-10-CM

## 2022-03-21 ENCOUNTER — Ambulatory Visit: Payer: Managed Care, Other (non HMO) | Admitting: Internal Medicine

## 2022-03-21 ENCOUNTER — Encounter: Payer: Self-pay | Admitting: Internal Medicine

## 2022-03-21 VITALS — BP 130/80 | HR 64 | Temp 98.2°F | Ht 67.0 in | Wt 264.0 lb

## 2022-03-21 DIAGNOSIS — E118 Type 2 diabetes mellitus with unspecified complications: Secondary | ICD-10-CM

## 2022-03-21 DIAGNOSIS — J301 Allergic rhinitis due to pollen: Secondary | ICD-10-CM | POA: Diagnosis not present

## 2022-03-21 DIAGNOSIS — I10 Essential (primary) hypertension: Secondary | ICD-10-CM

## 2022-03-21 LAB — HEMOGLOBIN A1C: Hgb A1c MFr Bld: 6.7 % — ABNORMAL HIGH (ref 4.6–6.5)

## 2022-03-21 LAB — BASIC METABOLIC PANEL
BUN: 14 mg/dL (ref 6–23)
CO2: 28 mEq/L (ref 19–32)
Calcium: 8.8 mg/dL (ref 8.4–10.5)
Chloride: 102 mEq/L (ref 96–112)
Creatinine, Ser: 0.94 mg/dL (ref 0.40–1.20)
GFR: 63.14 mL/min (ref >60.00)
Glucose, Bld: 101 mg/dL — ABNORMAL HIGH (ref 70–99)
Potassium: 4 mEq/L (ref 3.5–5.1)
Sodium: 136 mEq/L (ref 135–145)

## 2022-03-21 MED ORDER — METFORMIN HCL ER 500 MG PO TB24: ORAL_TABLET | ORAL | 1 refills | Status: DC

## 2022-03-21 MED ORDER — TIRZEPATIDE 7.5 MG/0.5ML ~~LOC~~ SOAJ
7.5000 mg | SUBCUTANEOUS | 0 refills | Status: DC
Start: 2022-03-21 — End: 2023-03-14

## 2022-03-21 MED ORDER — METHYLPREDNISOLONE 4 MG PO TBPK: ORAL_TABLET | ORAL | 0 refills | Status: AC

## 2022-03-21 NOTE — Progress Notes (Signed)
? ?Subjective:  ?Patient ID: Brianna Lambert, female    DOB: [birth year]  Age: 67 y.o. MRN: [id number] ? ?CC: Hypertension, Diabetes, and Allergic Rhinitis  ? ? ?HPI ?Willis Modena presents for f/up - ? ?She is active and denies chest pain, shortness of breath, diaphoresis, dizziness, lightheadedness, or edema. ? ?Outpatient Medications Prior to Visit  ?Medication Sig Dispense Refill  ? atorvastatin (LIPITOR) 40 MG tablet TAKE 1 TABLET DAILY 90 tablet 1  ? Cholecalciferol (VITAMIN D3) 1.25 MG (50000 UT) CAPS TAKE 1 CAPSULE ONCE WEEKLY 4 capsule 3  ? levalbuterol (XOPENEX HFA) 45 MCG/ACT inhaler Inhale 1-2 puffs into the lungs every 4 (four) hours as needed. Reported on 11/05/2015 1 Inhaler 6  ? montelukast (SINGULAIR) 10 MG tablet TAKE 1 TABLET AT BEDTIME 90 tablet 0  ? nabumetone (RELAFEN) 500 MG tablet TAKE 1 TABLET TWICE A DAY AS NEEDED 180 tablet 0  ? olmesartan (BENICAR) 40 MG tablet Take 1 tablet (40 mg total) by mouth daily. 90 tablet 1  ? metFORMIN (GLUCOPHAGE-XR) 500 MG 24 hr tablet TAKE 2 TABLETS DAILY WITH BREAKFAST 180 tablet 1  ? tirzepatide (MOUNJARO) 5 MG/0.5ML Pen Inject 5 mg into the skin once a week. 6 mL 0  ? amoxicillin-clavulanate (AUGMENTIN) 875-125 MG tablet Take 1 tablet by mouth 2 (two) times daily. 20 tablet 0  ? fluticasone (FLONASE) 50 MCG/ACT nasal spray Place 2 sprays into both nostrils daily. 16 g 6  ? ?No facility-administered medications prior to visit.  ? ? ?ROS ?Review of Systems  ?Constitutional:  Negative for chills, diaphoresis, fatigue and fever.  ?HENT:  Positive for congestion, ear pain, postnasal drip and rhinorrhea. Negative for sinus pressure, sinus pain, sore throat, trouble swallowing and voice change.   ?Eyes: Negative.   ?Respiratory:  Negative for cough, chest tightness and shortness of breath.   ?Cardiovascular:  Negative for chest pain, palpitations and leg swelling.  ?Gastrointestinal:  Negative for abdominal pain, diarrhea, nausea and vomiting.   ?Endocrine: Negative.   ?Genitourinary: Negative.  Negative for difficulty urinating.  ?Musculoskeletal: Negative.   ?Skin: Negative.  Negative for color change and pallor.  ?Neurological: Negative.  Negative for dizziness, weakness, light-headedness and numbness.  ?Hematological:  Negative for adenopathy. Does not bruise/bleed easily.  ?Psychiatric/Behavioral: Negative.    ? ?Objective:  ?BP 130/80 (BP Location: Left Arm, Patient Position: Sitting, Cuff Size: Large)   Pulse 64   Temp 98.2 ?F (36.8 ?C) (Oral)   Ht 5\' 7"  (1.702 m)   Wt 264 lb (119.7 kg)   SpO2 95%   BMI 41.35 kg/m?  ? ?BP Readings from Last 3 Encounters:  ?03/21/22 130/80  ?12/16/21 (!) 150/78  ?09/15/21 138/78  ? ? ?Wt Readings from Last 3 Encounters:  ?03/21/22 264 lb (119.7 kg)  ?12/16/21 259 lb 9.6 oz (117.8 kg)  ?09/15/21 250 lb (113.4 kg)  ? ? ?Physical Exam ?Vitals reviewed.  ?HENT:  ?   Right Ear: Hearing, tympanic membrane, ear canal and external ear normal.  ?   Left Ear: Hearing, tympanic membrane, ear canal and external ear normal.  ?   Nose: Mucosal edema, congestion and rhinorrhea present. Rhinorrhea is clear.  ?   Right Nostril: No epistaxis.  ?   Left Nostril: No epistaxis.  ?   Right Sinus: No maxillary sinus tenderness or frontal sinus tenderness.  ?   Left Sinus: No maxillary sinus tenderness or frontal sinus tenderness.  ?   Mouth/Throat:  ?   Mouth: Mucous membranes are  moist.  ?Eyes:  ?   General: No scleral icterus. ?   Conjunctiva/sclera: Conjunctivae normal.  ?Cardiovascular:  ?   Rate and Rhythm: Normal rate and regular rhythm.  ?   Heart sounds: No murmur heard. ?Pulmonary:  ?   Effort: Pulmonary effort is normal.  ?   Breath sounds: No stridor. No wheezing, rhonchi or rales.  ?Abdominal:  ?   General: Abdomen is flat.  ?   Palpations: There is no mass.  ?   Tenderness: There is no abdominal tenderness. There is no guarding.  ?   Hernia: No hernia is present.  ?Musculoskeletal:  ?   Cervical back: Neck supple.   ?Lymphadenopathy:  ?   Cervical: No cervical adenopathy.  ?Neurological:  ?   Mental Status: She is alert.  ? ? ?Lab Results  ?Component Value Date  ? WBC 7.1 09/15/2021  ? HGB 13.4 09/15/2021  ? HCT 39.9 09/15/2021  ? PLT 318.0 09/15/2021  ? GLUCOSE 101 (H) 03/21/2022  ? CHOL 222 (H) 09/15/2021  ? TRIG 140.0 09/15/2021  ? HDL 49.50 09/15/2021  ? LDLDIRECT 155.6 07/11/2012  ? LDLCALC 144 (H) 09/15/2021  ? ALT 14 09/15/2021  ? AST 17 09/15/2021  ? NA 136 03/21/2022  ? K 4.0 03/21/2022  ? CL 102 03/21/2022  ? CREATININE 0.94 03/21/2022  ? BUN 14 03/21/2022  ? CO2 28 03/21/2022  ? TSH 0.45 09/15/2021  ? INR 0.97 10/27/2011  ? HGBA1C 6.7 (H) 03/21/2022  ? MICROALBUR <0.7 09/15/2021  ? ? ?MM 3D SCREEN BREAST BILATERAL ? ?Result Date: 11/10/2021 ?CLINICAL DATA:  Screening. EXAM: DIGITAL SCREENING BILATERAL MAMMOGRAM WITH TOMOSYNTHESIS AND CAD TECHNIQUE: Bilateral screening digital craniocaudal and mediolateral oblique mammograms were obtained. Bilateral screening digital breast tomosynthesis was performed. The images were evaluated with computer-aided detection. COMPARISON:  Previous exam(s). ACR Breast Density Category b: There are scattered areas of fibroglandular density. FINDINGS: There are no findings suspicious for malignancy. IMPRESSION: No mammographic evidence of malignancy. A result letter of this screening mammogram will be mailed directly to the patient. RECOMMENDATION: Screening mammogram in one year. (Code:SM-B-01Y) BI-RADS CATEGORY  1: Negative. Electronically Signed   By: Valentino Saxon M.D.   On: 11/10/2021 16:02  ? ? ?Assessment & Plan:  ? ?Grabiela was seen today for hypertension, diabetes and allergic rhinitis . ? ?Diagnoses and all orders for this visit: ? ?Essential hypertension, benign- Her blood pressure is adequately well controlled. ?-     Basic metabolic panel; Future ?-     Basic metabolic panel ? ?Type II diabetes mellitus with manifestations ?-     Basic metabolic panel; Future ?-      Hemoglobin A1c; Future ?-     Hemoglobin A1c ?-     Basic metabolic panel ? ?Seasonal allergic rhinitis due to pollen ?-     methylPREDNISolone (MEDROL DOSEPAK) 4 MG TBPK tablet; TAKE AS DIRECTED ? ?Type 2 diabetes mellitus with complication, without long-term current use of insulin ([place])- Her blood sugar is adequately well controlled.  Will continue the combination of metformin, and a GLP/GIP agonist. ?-     metFORMIN (GLUCOPHAGE-XR) 500 MG 24 hr tablet; TAKE 2 TABLETS DAILY WITH BREAKFAST ?-     tirzepatide (MOUNJARO) 7.5 MG/0.5ML Pen; Inject 7.5 mg into the skin once a week. ? ? ?I have discontinued Othella Boyer. Henshaw's amoxicillin-clavulanate, fluticasone, and tirzepatide. I am also having her start on methylPREDNISolone and tirzepatide. Additionally, I am having her maintain her levalbuterol, olmesartan, Vitamin D3, montelukast,  nabumetone, atorvastatin, and metFORMIN. ? ?Meds ordered this encounter  ?Medications  ? methylPREDNISolone (MEDROL DOSEPAK) 4 MG TBPK tablet  ?  Sig: TAKE AS DIRECTED  ?  Dispense:  21 tablet  ?  Refill:  0  ? metFORMIN (GLUCOPHAGE-XR) 500 MG 24 hr tablet  ?  Sig: TAKE 2 TABLETS DAILY WITH BREAKFAST  ?  Dispense:  180 tablet  ?  Refill:  1  ? tirzepatide (MOUNJARO) 7.5 MG/0.5ML Pen  ?  Sig: Inject 7.5 mg into the skin once a week.  ?  Dispense:  6 mL  ?  Refill:  0  ? ? ? ?Follow-up: Return in about 6 months (around 09/21/2022). ? ?Scarlette Calico, MD ?

## 2022-03-21 NOTE — Patient Instructions (Signed)

## 2022-03-22 ENCOUNTER — Ambulatory Visit: Payer: Managed Care, Other (non HMO) | Admitting: Internal Medicine

## 2022-03-31 ENCOUNTER — Telehealth: Payer: Self-pay

## 2022-03-31 ENCOUNTER — Other Ambulatory Visit: Payer: Self-pay

## 2022-03-31 DIAGNOSIS — E118 Type 2 diabetes mellitus with unspecified complications: Secondary | ICD-10-CM

## 2022-03-31 NOTE — Telephone Encounter (Signed)
Received Rx request via [facility] for Mounjaro 5mg /0.27mL PF pen, quantity-6 ?Sig- Inject 5mg  under skin once weekly, will dispense 6. ?

## 2022-03-31 NOTE — Progress Notes (Signed)
Provider increased dosage previously, will disregard the second request. ?

## 2022-04-03 ENCOUNTER — Other Ambulatory Visit: Payer: Self-pay | Admitting: Internal Medicine

## 2022-04-03 DIAGNOSIS — J45998 Other asthma: Secondary | ICD-10-CM

## 2022-05-23 ENCOUNTER — Other Ambulatory Visit: Payer: Self-pay | Admitting: Internal Medicine

## 2022-05-23 DIAGNOSIS — I10 Essential (primary) hypertension: Secondary | ICD-10-CM

## 2022-05-23 DIAGNOSIS — E118 Type 2 diabetes mellitus with unspecified complications: Secondary | ICD-10-CM

## 2022-06-01 ENCOUNTER — Other Ambulatory Visit: Payer: Self-pay | Admitting: Internal Medicine

## 2022-06-01 DIAGNOSIS — M159 Polyosteoarthritis, unspecified: Secondary | ICD-10-CM

## 2022-08-30 ENCOUNTER — Other Ambulatory Visit: Payer: Self-pay | Admitting: Internal Medicine

## 2022-08-30 DIAGNOSIS — M159 Polyosteoarthritis, unspecified: Secondary | ICD-10-CM

## 2022-09-11 ENCOUNTER — Other Ambulatory Visit: Payer: Self-pay | Admitting: Internal Medicine

## 2022-09-11 DIAGNOSIS — E785 Hyperlipidemia, unspecified: Secondary | ICD-10-CM

## 2022-09-19 ENCOUNTER — Other Ambulatory Visit: Payer: Self-pay | Admitting: Internal Medicine

## 2022-09-19 DIAGNOSIS — E118 Type 2 diabetes mellitus with unspecified complications: Secondary | ICD-10-CM

## 2022-09-28 ENCOUNTER — Other Ambulatory Visit: Payer: Self-pay | Admitting: Internal Medicine

## 2022-09-28 DIAGNOSIS — Z1231 Encounter for screening mammogram for malignant neoplasm of breast: Secondary | ICD-10-CM

## 2022-09-29 ENCOUNTER — Other Ambulatory Visit: Payer: Self-pay | Admitting: Internal Medicine

## 2022-09-29 DIAGNOSIS — J45998 Other asthma: Secondary | ICD-10-CM

## 2022-10-24 ENCOUNTER — Encounter: Payer: Self-pay | Admitting: Internal Medicine

## 2022-10-24 ENCOUNTER — Ambulatory Visit (INDEPENDENT_AMBULATORY_CARE_PROVIDER_SITE_OTHER): Payer: Managed Care, Other (non HMO) | Admitting: Internal Medicine

## 2022-10-24 VITALS — BP 128/76 | HR 63 | Temp 98.8°F | Ht 67.0 in | Wt 256.0 lb

## 2022-10-24 DIAGNOSIS — Z23 Encounter for immunization: Secondary | ICD-10-CM | POA: Diagnosis not present

## 2022-10-24 DIAGNOSIS — Z0001 Encounter for general adult medical examination with abnormal findings: Secondary | ICD-10-CM | POA: Diagnosis not present

## 2022-10-24 DIAGNOSIS — E785 Hyperlipidemia, unspecified: Secondary | ICD-10-CM

## 2022-10-24 DIAGNOSIS — I10 Essential (primary) hypertension: Secondary | ICD-10-CM

## 2022-10-24 DIAGNOSIS — G4701 Insomnia due to medical condition: Secondary | ICD-10-CM

## 2022-10-24 DIAGNOSIS — E559 Vitamin D deficiency, unspecified: Secondary | ICD-10-CM

## 2022-10-24 DIAGNOSIS — M159 Polyosteoarthritis, unspecified: Secondary | ICD-10-CM

## 2022-10-24 DIAGNOSIS — E118 Type 2 diabetes mellitus with unspecified complications: Secondary | ICD-10-CM | POA: Diagnosis not present

## 2022-10-24 DIAGNOSIS — G8929 Other chronic pain: Secondary | ICD-10-CM

## 2022-10-24 LAB — HEPATIC FUNCTION PANEL
ALT: 14 U/L (ref 0–35)
AST: 18 U/L (ref 0–37)
Albumin: 4.2 g/dL (ref 3.5–5.2)
Alkaline Phosphatase: 43 U/L (ref 39–117)
Bilirubin, Direct: 0.1 mg/dL (ref 0.0–0.3)
Total Bilirubin: 0.4 mg/dL (ref 0.2–1.2)
Total Protein: 7.1 g/dL (ref 6.0–8.3)

## 2022-10-24 LAB — CBC WITH DIFFERENTIAL/PLATELET
Basophils Absolute: 0.1 10*3/uL (ref 0.0–0.1)
Basophils Relative: 0.9 % (ref 0.0–3.0)
Eosinophils Absolute: 0.2 10*3/uL (ref 0.0–0.7)
Eosinophils Relative: 2.3 % (ref 0.0–5.0)
HCT: 41.1 % (ref 36.0–46.0)
Hemoglobin: 13.7 g/dL (ref 12.0–15.0)
Lymphocytes Relative: 35.6 % (ref 12.0–46.0)
Lymphs Abs: 2.7 10*3/uL (ref 0.7–4.0)
MCHC: 33.3 g/dL (ref 30.0–36.0)
MCV: 91.5 fl (ref 78.0–100.0)
Monocytes Absolute: 0.8 10*3/uL (ref 0.1–1.0)
Monocytes Relative: 11 % (ref 3.0–12.0)
Neutro Abs: 3.8 10*3/uL (ref 1.4–7.7)
Neutrophils Relative %: 50.2 % (ref 43.0–77.0)
Platelets: 324 10*3/uL (ref 150.0–400.0)
RBC: 4.49 Mil/uL (ref 3.87–5.11)
RDW: 13.7 % (ref 11.5–15.5)
WBC: 7.5 10*3/uL (ref 4.0–10.5)

## 2022-10-24 LAB — BASIC METABOLIC PANEL
BUN: 16 mg/dL (ref 6–23)
CO2: 27 mEq/L (ref 19–32)
Calcium: 9.3 mg/dL (ref 8.4–10.5)
Chloride: 103 mEq/L (ref 96–112)
Creatinine, Ser: 0.91 mg/dL (ref 0.40–1.20)
GFR: 65.38 mL/min (ref >60.00)
Glucose, Bld: 113 mg/dL — ABNORMAL HIGH (ref 70–99)
Potassium: 3.9 mEq/L (ref 3.5–5.1)
Sodium: 138 mEq/L (ref 135–145)

## 2022-10-24 LAB — CK: Total CK: 161 U/L (ref 7–177)

## 2022-10-24 LAB — HEMOGLOBIN A1C: Hgb A1c MFr Bld: 6.6 % — ABNORMAL HIGH (ref 4.6–6.5)

## 2022-10-24 LAB — LIPID PANEL
Cholesterol: 249 mg/dL — ABNORMAL HIGH (ref 0–200)
HDL: 47.6 mg/dL (ref >39.00)
LDL Cholesterol: 165 mg/dL — ABNORMAL HIGH (ref 0–99)
NonHDL: 201.52
Total CHOL/HDL Ratio: 5
Triglycerides: 184 mg/dL — ABNORMAL HIGH (ref 0.0–149.0)
VLDL: 36.8 mg/dL (ref 0.0–40.0)

## 2022-10-24 LAB — MICROALBUMIN / CREATININE URINE RATIO
Creatinine,U: 140.5 mg/dL
Microalb Creat Ratio: 0.5 mg/g (ref 0.0–30.0)
Microalb, Ur: <0.7 mg/dL (ref 0.0–1.9)

## 2022-10-24 LAB — VITAMIN D 25 HYDROXY (VIT D DEFICIENCY, FRACTURES): VITD: 41.99 ng/mL (ref 30.00–100.00)

## 2022-10-24 LAB — TSH: TSH: 0.52 u[IU]/mL (ref 0.35–5.50)

## 2022-10-24 MED ORDER — TRAMADOL HCL ER 100 MG PO TB24: 100.0000 mg | ORAL_TABLET | Freq: Every day | ORAL | 1 refills | Status: DC

## 2022-10-24 NOTE — Patient Instructions (Signed)

## 2022-10-24 NOTE — Progress Notes (Addendum)
Subjective:  Patient ID: Brianna Lambert, female    DOB: [birth year]  Age: 66 y.o. MRN: [id number]  CC: Annual Exam, Hypertension, Hyperlipidemia, and Diabetes   HPI Brianna Lambert presents for a CPX and f/up -  She complains of arthritis pain in her knees and tells me that her lower extremities hurt so much that she is having trouble sleeping.  She also has nocturnal leg cramps.  She is active and denies chest pain, shortness of breath, diaphoresis, or edema.  Outpatient Medications Prior to Visit  Medication Sig Dispense Refill   atorvastatin (LIPITOR) 40 MG tablet TAKE 1 TABLET DAILY 90 tablet 0   Cholecalciferol (VITAMIN D3) 1.25 MG (50000 UT) CAPS TAKE 1 CAPSULE ONCE WEEKLY 4 capsule 3   levalbuterol (XOPENEX HFA) 45 MCG/ACT inhaler Inhale 1-2 puffs into the lungs every 4 (four) hours as needed. Reported on 11/05/2015 1 Inhaler 6   metFORMIN (GLUCOPHAGE-XR) 500 MG 24 hr tablet TAKE 2 TABLETS BY MOUTH DAILY WITH BREAKFAST 180 tablet 0   montelukast (SINGULAIR) 10 MG tablet TAKE 1 TABLET AT BEDTIME 90 tablet 1   nabumetone (RELAFEN) 500 MG tablet TAKE 1 TABLET TWICE A DAY AS NEEDED 180 tablet 0   olmesartan (BENICAR) 40 MG tablet TAKE 1 TABLET DAILY 90 tablet 1   tirzepatide (MOUNJARO) 7.5 MG/0.5ML Pen Inject 7.5 mg into the skin once a week. 6 mL 0   No facility-administered medications prior to visit.    ROS Review of Systems  Constitutional: Negative.  Negative for appetite change, diaphoresis, fatigue and unexpected weight change.  HENT: Negative.    Eyes: Negative.   Respiratory:  Negative for cough, chest tightness, shortness of breath and wheezing.   Cardiovascular:  Negative for chest pain, palpitations and leg swelling.  Gastrointestinal:  Negative for abdominal pain, constipation, diarrhea, nausea and vomiting.  Endocrine: Negative.   Genitourinary: Negative.  Negative for difficulty urinating, dysuria and hematuria.  Musculoskeletal:  Positive for  arthralgias. Negative for back pain, myalgias and neck pain.  Neurological: Negative.  Negative for dizziness, weakness and light-headedness.  Hematological:  Negative for adenopathy. Does not bruise/bleed easily.  Psychiatric/Behavioral: Negative.      Objective:  BP 128/76 (BP Location: Left Arm, Patient Position: Sitting, Cuff Size: Large)   Pulse 63   Temp 98.8 F (37.1 C) (Oral)   Ht 5\' 7"  (1.702 m)   Wt 256 lb (116.1 kg)   SpO2 94%   BMI 40.10 kg/m   BP Readings from Last 3 Encounters:  10/24/22 128/76  03/21/22 130/80  12/16/21 (!) 150/78    Wt Readings from Last 3 Encounters:  10/24/22 256 lb (116.1 kg)  03/21/22 264 lb (119.7 kg)  12/16/21 259 lb 9.6 oz (117.8 kg)    Physical Exam Vitals reviewed.  HENT:     Nose: Nose normal.     Mouth/Throat:     Mouth: Mucous membranes are moist.  Eyes:     General: No scleral icterus.    Conjunctiva/sclera: Conjunctivae normal.  Cardiovascular:     Rate and Rhythm: Normal rate and regular rhythm.     Heart sounds: No murmur heard. Pulmonary:     Effort: Pulmonary effort is normal.     Breath sounds: No stridor. No wheezing, rhonchi or rales.  Abdominal:     General: Abdomen is flat.     Palpations: There is no mass.     Tenderness: There is no abdominal tenderness. There is no guarding.  Hernia: No hernia is present.  Musculoskeletal:        General: Normal range of motion.     Cervical back: Neck supple.     Right knee: Deformity (DJD) present. No swelling, effusion or erythema.     Left knee: Deformity (DJD) present. No swelling, effusion or erythema.     Right lower leg: No edema.     Left lower leg: No edema.  Lymphadenopathy:     Cervical: No cervical adenopathy.  Skin:    General: Skin is warm and dry.     Findings: No rash.  Neurological:     General: No focal deficit present.     Mental Status: She is alert.  Psychiatric:        Mood and Affect: Mood normal.        Behavior: Behavior normal.      Lab Results  Component Value Date   WBC 7.5 10/24/2022   HGB 13.7 10/24/2022   HCT 41.1 10/24/2022   PLT 324.0 10/24/2022   GLUCOSE 113 (H) 10/24/2022   CHOL 249 (H) 10/24/2022   TRIG 184.0 (H) 10/24/2022   HDL 47.60 10/24/2022   LDLDIRECT 155.6 07/11/2012   LDLCALC 165 (H) 10/24/2022   ALT 14 10/24/2022   AST 18 10/24/2022   NA 138 10/24/2022   K 3.9 10/24/2022   CL 103 10/24/2022   CREATININE 0.91 10/24/2022   BUN 16 10/24/2022   CO2 27 10/24/2022   TSH 0.52 10/24/2022   INR 0.97 10/27/2011   HGBA1C 6.6 (H) 10/24/2022   MICROALBUR <0.7 10/24/2022    MM 3D SCREEN BREAST BILATERAL  Result Date: 11/10/2021 CLINICAL DATA:  Screening. EXAM: DIGITAL SCREENING BILATERAL MAMMOGRAM WITH TOMOSYNTHESIS AND CAD TECHNIQUE: Bilateral screening digital craniocaudal and mediolateral oblique mammograms were obtained. Bilateral screening digital breast tomosynthesis was performed. The images were evaluated with computer-aided detection. COMPARISON:  Previous exam(s). ACR Breast Density Category b: There are scattered areas of fibroglandular density. FINDINGS: There are no findings suspicious for malignancy. IMPRESSION: No mammographic evidence of malignancy. A result letter of this screening mammogram will be mailed directly to the patient. RECOMMENDATION: Screening mammogram in one year. (Code:SM-B-01Y) BI-RADS CATEGORY  1: Negative. Electronically Signed   By: Meda Klinefelter M.D.   On: 11/10/2021 16:02    Assessment & Plan:   Brianna Lambert was seen today for annual exam, hypertension, hyperlipidemia and diabetes.  Diagnoses and all orders for this visit:  Essential hypertension, benign- Her blood pressure is well-controlled. -     CBC with Differential/Platelet; Future -     Basic metabolic panel; Future -     TSH; Future -     Urinalysis, Routine w reflex microscopic; Future -     Urinalysis, Routine w reflex microscopic -     TSH -     Basic metabolic panel -     CBC with  Differential/Platelet  Type II diabetes mellitus with manifestations- Her blood sugar is well-controlled. -     Microalbumin / creatinine urine ratio; Future -     Basic metabolic panel; Future -     Hemoglobin A1c; Future -     HM Diabetes Foot Exam -     Hemoglobin A1c -     Basic metabolic panel -     Microalbumin / creatinine urine ratio -     Ambulatory referral to Ophthalmology  Hyperlipidemia with target LDL less than 100- LDL goal not achieved. Will add zetia.  -  Lipid panel; Future -     TSH; Future -     Hepatic function panel; Future -     CK; Future -     CK -     Hepatic function panel -     TSH -     Lipid panel  Encounter for general adult medical examination with abnormal findings- Exam completed, labs reviewed, vaccines are up-to-date, cancer screenings are up-to-date, patient education was given.  Primary osteoarthritis involving multiple joints -     traMADol (ULTRAM-ER) 100 MG 24 hr tablet; Take 1 tablet (100 mg total) by mouth daily.  Insomnia secondary to chronic pain -     traMADol (ULTRAM-ER) 100 MG 24 hr tablet; Take 1 tablet (100 mg total) by mouth daily.  Vitamin D deficiency disease -     VITAMIN D 25 Hydroxy (Vit-D Deficiency, Fractures); Future -     VITAMIN D 25 Hydroxy (Vit-D Deficiency, Fractures)  Other orders -     Pneumococcal polysaccharide vaccine 23-valent greater than or equal to 2yo subcutaneous/IM   I am having Brianna Lambert start on traMADol. I am also having her maintain her levalbuterol, Vitamin D3, tirzepatide, olmesartan, nabumetone, atorvastatin, metFORMIN, and montelukast.  Meds ordered this encounter  Medications   traMADol (ULTRAM-ER) 100 MG 24 hr tablet    Sig: Take 1 tablet (100 mg total) by mouth daily.    Dispense:  90 tablet    Refill:  1     Follow-up: Return in about 6 months (around 04/25/2023).  Sanda Linger, MD

## 2022-10-25 ENCOUNTER — Telehealth: Payer: Self-pay

## 2022-10-25 LAB — URINALYSIS, ROUTINE W REFLEX MICROSCOPIC
Bilirubin Urine: NEGATIVE
Hgb urine dipstick: NEGATIVE
Ketones, ur: NEGATIVE
Leukocytes,Ua: NEGATIVE
Nitrite: NEGATIVE
RBC / HPF: NONE SEEN (ref 0–?)
Specific Gravity, Urine: 1.025 (ref 1.000–1.030)
Total Protein, Urine: NEGATIVE
Urine Glucose: NEGATIVE
Urobilinogen, UA: 0.2 (ref 0.0–1.0)
pH: 5.5 (ref 5.0–8.0)

## 2022-10-25 NOTE — Telephone Encounter (Signed)
Key: [id number]

## 2022-10-27 ENCOUNTER — Encounter: Payer: Self-pay | Admitting: Internal Medicine

## 2022-10-27 MED ORDER — TRAMADOL HCL 50 MG PO TABS: 50.0000 mg | ORAL_TABLET | Freq: Four times a day (QID) | ORAL | 1 refills | Status: AC | PRN

## 2022-10-27 MED ORDER — EZETIMIBE 10 MG PO TABS: 10.0000 mg | ORAL_TABLET | Freq: Every day | ORAL | 3 refills | Status: DC

## 2022-10-27 NOTE — Telephone Encounter (Signed)
PA was denied. Please advise

## 2022-10-27 NOTE — Addendum Note (Signed)
Addended by: Etta Grandchild on: 10/27/2022 12:17 PM   Modules accepted: Orders

## 2022-11-15 ENCOUNTER — Ambulatory Visit: Payer: Managed Care, Other (non HMO)

## 2022-11-20 ENCOUNTER — Other Ambulatory Visit: Payer: Self-pay | Admitting: Internal Medicine

## 2022-11-20 DIAGNOSIS — E118 Type 2 diabetes mellitus with unspecified complications: Secondary | ICD-10-CM

## 2022-11-20 DIAGNOSIS — I10 Essential (primary) hypertension: Secondary | ICD-10-CM

## 2022-11-28 ENCOUNTER — Other Ambulatory Visit: Payer: Self-pay | Admitting: Internal Medicine

## 2022-11-28 DIAGNOSIS — M159 Polyosteoarthritis, unspecified: Secondary | ICD-10-CM

## 2022-12-11 ENCOUNTER — Other Ambulatory Visit: Payer: Self-pay | Admitting: Internal Medicine

## 2022-12-11 DIAGNOSIS — E785 Hyperlipidemia, unspecified: Secondary | ICD-10-CM

## 2022-12-22 ENCOUNTER — Other Ambulatory Visit: Payer: Self-pay | Admitting: Internal Medicine

## 2022-12-22 DIAGNOSIS — E118 Type 2 diabetes mellitus with unspecified complications: Secondary | ICD-10-CM

## 2022-12-29 LAB — HM DIABETES EYE EXAM

## 2023-01-01 LAB — HM DIABETES EYE EXAM

## 2023-01-09 ENCOUNTER — Encounter: Payer: Self-pay | Admitting: *Deleted

## 2023-01-11 ENCOUNTER — Ambulatory Visit
Admission: RE | Admit: 2023-01-11 | Discharge: 2023-01-11 | Disposition: A | Discharge status: Home / Self Care | Payer: Managed Care, Other (non HMO) | Source: Ambulatory Visit | Attending: Internal Medicine | Admitting: Internal Medicine

## 2023-01-11 DIAGNOSIS — Z1231 Encounter for screening mammogram for malignant neoplasm of breast: Secondary | ICD-10-CM

## 2023-02-26 ENCOUNTER — Other Ambulatory Visit: Payer: Self-pay | Admitting: Internal Medicine

## 2023-02-26 DIAGNOSIS — M159 Polyosteoarthritis, unspecified: Secondary | ICD-10-CM

## 2023-03-14 ENCOUNTER — Telehealth: Payer: Self-pay | Admitting: Internal Medicine

## 2023-03-14 ENCOUNTER — Other Ambulatory Visit: Payer: Self-pay | Admitting: Internal Medicine

## 2023-03-14 DIAGNOSIS — E118 Type 2 diabetes mellitus with unspecified complications: Secondary | ICD-10-CM

## 2023-03-14 MED ORDER — TIRZEPATIDE 7.5 MG/0.5ML ~~LOC~~ SOAJ: 7.5000 mg | SUBCUTANEOUS | 0 refills | Status: DC

## 2023-03-14 NOTE — Telephone Encounter (Signed)
Prescription Request  03/14/2023  LOV: 10/24/2022  What is the name of the medication or equipment? Mounjaro 7.5mg   Have you contacted your pharmacy to request a refill? Yes   Which pharmacy would you like this sent to?  [facility] - [place], [place] - [address] [place] [address] Phone: [phone] Fax: [phone]     Patient notified that their request is being sent to the clinical staff for review and that they should receive a response within 2 business days.   Please advise at Mobile [phone] (mobile)

## 2023-03-21 ENCOUNTER — Other Ambulatory Visit: Payer: Self-pay | Admitting: Internal Medicine

## 2023-03-21 DIAGNOSIS — E118 Type 2 diabetes mellitus with unspecified complications: Secondary | ICD-10-CM

## 2023-03-28 ENCOUNTER — Telehealth: Payer: Self-pay

## 2023-03-28 ENCOUNTER — Other Ambulatory Visit: Payer: Self-pay | Admitting: Internal Medicine

## 2023-03-28 ENCOUNTER — Other Ambulatory Visit (HOSPITAL_COMMUNITY): Payer: Self-pay

## 2023-03-28 DIAGNOSIS — J45998 Other asthma: Secondary | ICD-10-CM

## 2023-03-28 NOTE — Telephone Encounter (Signed)
Patient Advocate Encounter  Prior Authorization for Mounjaro 7.5MG /0.5ML pen-injectors has been approved through [employer].    Key: [id number]  Effective: 03-28-2023 to 03-27-2024

## 2023-05-18 ENCOUNTER — Other Ambulatory Visit: Payer: Self-pay | Admitting: Internal Medicine

## 2023-05-18 DIAGNOSIS — E118 Type 2 diabetes mellitus with unspecified complications: Secondary | ICD-10-CM

## 2023-05-18 DIAGNOSIS — I10 Essential (primary) hypertension: Secondary | ICD-10-CM

## 2023-05-28 ENCOUNTER — Other Ambulatory Visit: Payer: Self-pay | Admitting: Internal Medicine

## 2023-05-28 DIAGNOSIS — M150 Primary generalized (osteo)arthritis: Secondary | ICD-10-CM

## 2023-05-28 DIAGNOSIS — M159 Polyosteoarthritis, unspecified: Secondary | ICD-10-CM

## 2023-06-08 ENCOUNTER — Other Ambulatory Visit: Payer: Self-pay | Admitting: Internal Medicine

## 2023-06-08 DIAGNOSIS — E785 Hyperlipidemia, unspecified: Secondary | ICD-10-CM

## 2023-06-19 ENCOUNTER — Other Ambulatory Visit: Payer: Self-pay | Admitting: Internal Medicine

## 2023-06-19 DIAGNOSIS — E118 Type 2 diabetes mellitus with unspecified complications: Secondary | ICD-10-CM

## 2023-06-25 ENCOUNTER — Ambulatory Visit: Payer: Managed Care, Other (non HMO) | Admitting: Internal Medicine

## 2023-06-25 VITALS — BP 138/82 | HR 63 | Temp 97.9°F | Ht 67.0 in | Wt 267.0 lb

## 2023-06-25 DIAGNOSIS — L409 Psoriasis, unspecified: Secondary | ICD-10-CM

## 2023-06-25 DIAGNOSIS — E785 Hyperlipidemia, unspecified: Secondary | ICD-10-CM | POA: Diagnosis not present

## 2023-06-25 DIAGNOSIS — I700 Atherosclerosis of aorta: Secondary | ICD-10-CM | POA: Diagnosis not present

## 2023-06-25 DIAGNOSIS — Z7984 Long term (current) use of oral hypoglycemic drugs: Secondary | ICD-10-CM | POA: Diagnosis not present

## 2023-06-25 DIAGNOSIS — E7841 Elevated Lipoprotein(a): Secondary | ICD-10-CM

## 2023-06-25 DIAGNOSIS — E118 Type 2 diabetes mellitus with unspecified complications: Secondary | ICD-10-CM

## 2023-06-25 DIAGNOSIS — R9431 Abnormal electrocardiogram [ECG] [EKG]: Secondary | ICD-10-CM

## 2023-06-25 DIAGNOSIS — I10 Essential (primary) hypertension: Secondary | ICD-10-CM

## 2023-06-25 DIAGNOSIS — E119 Type 2 diabetes mellitus without complications: Secondary | ICD-10-CM

## 2023-06-25 NOTE — Patient Instructions (Signed)

## 2023-06-25 NOTE — Progress Notes (Signed)
Subjective:  Patient ID: Brianna Lambert, female    DOB: 19-Apr-1956  Age: 67 y.o. MRN: [id number]  CC: Rash, Hypertension, Diabetes, and Hyperlipidemia   HPI [name] presents for f/up ----  Discussed the use of AI scribe software for clinical note transcription with the patient, who gave verbal consent to proceed.  History of Present Illness   The patient, with a history of hypertension and diabetes, reports no recent chest pain, shortness of breath, or edema. They have been avoiding strenuous activity due to the heat. They deny symptoms of hypertension or diabetes, such as headaches, blurred vision, excessive thirst, or excessive urination. There have been no significant changes in weight or appetite. However, they report that their insurance no longer covers Ozempic and Mounjaro, so they have been managing their diabetes with Metformin alone.  In addition to their chronic conditions, the patient has been experiencing a worsening of their psoriasis. Initially limited to the scalp, the psoriasis has now spread to the side of their body and other small areas. Despite using prescription shampoo and over-the-counter products like Head and Shoulders, the patient has found no relief for their scalp psoriasis. They have not been treating the psoriasis on their body with any medication. Their last visit to a dermatologist was a couple of years ago, but they felt it was unhelpful.           Outpatient Medications Prior to Visit  Medication Sig Dispense Refill   Cholecalciferol (VITAMIN D3) 1.25 MG (50000 UT) CAPS TAKE 1 CAPSULE ONCE WEEKLY 4 capsule 3   montelukast (SINGULAIR) 10 MG tablet TAKE 1 TABLET AT BEDTIME 90 tablet 1   nabumetone (RELAFEN) 500 MG tablet TAKE 1 TABLET TWICE A DAY AS NEEDED 180 tablet 0   atorvastatin (LIPITOR) 40 MG tablet TAKE 1 TABLET DAILY 90 tablet 1   ezetimibe (ZETIA) 10 MG tablet Take 1 tablet (10 mg total) by mouth daily. 90 tablet 3    levalbuterol (XOPENEX HFA) 45 MCG/ACT inhaler Inhale 1-2 puffs into the lungs every 4 (four) hours as needed. Reported on 11/05/2015 1 Inhaler 6   metFORMIN (GLUCOPHAGE-XR) 500 MG 24 hr tablet TAKE 2 TABLETS BY MOUTH DAILY WITH BREAKFAST 60 tablet 0   olmesartan (BENICAR) 40 MG tablet TAKE 1 TABLET DAILY 90 tablet 1   tirzepatide (MOUNJARO) 7.5 MG/0.5ML Pen Inject 7.5 mg into the skin once a week. 6 mL 0   No facility-administered medications prior to visit.    ROS Review of Systems  Constitutional:  Positive for fatigue and unexpected weight change (wt gain). Negative for appetite change, chills and diaphoresis.  HENT: Negative.    Eyes:  Negative for visual disturbance.  Respiratory: Negative.  Negative for cough, chest tightness, shortness of breath and wheezing.   Cardiovascular:  Negative for chest pain, palpitations and leg swelling.  Gastrointestinal:  Negative for abdominal pain, constipation, diarrhea, nausea and vomiting.  Endocrine: Negative.   Genitourinary: Negative.  Negative for decreased urine volume, difficulty urinating, dysuria, hematuria and urgency.  Musculoskeletal:  Positive for arthralgias. Negative for myalgias.  Skin:  Positive for color change and rash.  Allergic/Immunologic: Negative.   Neurological: Negative.  Negative for dizziness, weakness and headaches.  Hematological:  Negative for adenopathy. Does not bruise/bleed easily.  Psychiatric/Behavioral: Negative.      Objective:  BP 138/82 (BP Location: Left Arm, Patient Position: Sitting, Cuff Size: Large)   Pulse 63   Temp 97.9 F (36.6 C) (Oral)   Ht 5\' 7"  (  1.702 m)   Wt 267 lb (121.1 kg)   SpO2 96%   BMI 41.82 kg/m   BP Readings from Last 3 Encounters:  06/25/23 138/82  10/24/22 128/76  03/21/22 130/80    Wt Readings from Last 3 Encounters:  06/25/23 267 lb (121.1 kg)  10/24/22 256 lb (116.1 kg)  03/21/22 264 lb (119.7 kg)    Physical Exam Vitals reviewed.  Constitutional:       Appearance: Normal appearance.  HENT:     Nose: Nose normal.     Mouth/Throat:     Mouth: Mucous membranes are moist.  Eyes:     General: No scleral icterus.    Conjunctiva/sclera: Conjunctivae normal.  Cardiovascular:     Rate and Rhythm: Normal rate and regular rhythm.     Pulses: Normal pulses.     Heart sounds: Normal heart sounds, S1 normal and S2 normal. No murmur heard.    No gallop.     Comments: EKG- NSR, 62 bpm Anterior infarct pattern is not new Low voltage is c/w habitus Pulmonary:     Effort: Pulmonary effort is normal.     Breath sounds: No stridor. No wheezing, rhonchi or rales.  Abdominal:     General: Abdomen is protuberant. Bowel sounds are normal. There is no distension.     Palpations: Abdomen is soft. There is no hepatomegaly, splenomegaly or mass.     Tenderness: There is no abdominal tenderness.  Musculoskeletal:     Cervical back: Neck supple.     Right lower leg: No edema.     Left lower leg: No edema.  Lymphadenopathy:     Cervical: No cervical adenopathy.  Skin:    General: Skin is warm.     Findings: Lesion and rash present.  Neurological:     General: No focal deficit present.     Mental Status: She is alert. Mental status is at baseline.  Psychiatric:        Mood and Affect: Mood normal.        Behavior: Behavior normal.     Lab Results  Component Value Date   WBC 8.4 06/25/2023   HGB 14.0 06/25/2023   HCT 42.4 06/25/2023   PLT 310.0 06/25/2023   GLUCOSE 124 (H) 06/25/2023   CHOL 261 (H) 06/25/2023   TRIG 179.0 (H) 06/25/2023   HDL 44.30 06/25/2023   LDLDIRECT 155.6 07/11/2012   LDLCALC 181 (H) 06/25/2023   ALT 14 10/24/2022   AST 18 10/24/2022   NA 137 06/25/2023   K 4.5 06/25/2023   CL 103 06/25/2023   CREATININE 0.90 06/25/2023   BUN 16 06/25/2023   CO2 23 06/25/2023   TSH 0.52 10/24/2022   INR 0.97 10/27/2011   HGBA1C 7.8 (H) 06/25/2023   MICROALBUR <0.7 10/24/2022    MM 3D SCREEN BREAST BILATERAL  Result Date:  01/16/2023 CLINICAL DATA:  Screening. EXAM: DIGITAL SCREENING BILATERAL MAMMOGRAM WITH TOMOSYNTHESIS AND CAD TECHNIQUE: Bilateral screening digital craniocaudal and mediolateral oblique mammograms were obtained. Bilateral screening digital breast tomosynthesis was performed. The images were evaluated with computer-aided detection. COMPARISON:  Previous exam(s). ACR Breast Density Category b: There are scattered areas of fibroglandular density. FINDINGS: There are no findings suspicious for malignancy. IMPRESSION: No mammographic evidence of malignancy. A result letter of this screening mammogram will be mailed directly to the patient. RECOMMENDATION: Screening mammogram in one year. (Code:SM-B-01Y) BI-RADS CATEGORY  1: Negative. Electronically Signed   By: Sherian Rein M.D.   On: 01/16/2023 09:12  Assessment & Plan:  Atherosclerosis of aorta (HCC) -     Lipoprotein A (LPA); Future -     Lipid panel; Future -     Nexlizet; Take 1 tablet by mouth daily.  Dispense: 90 tablet; Refill: 1 -     CT CARDIAC SCORING (SELF PAY ONLY); Future  Type II diabetes mellitus with manifestations -     Basic metabolic panel; Future -     CBC with Differential/Platelet; Future -     Hemoglobin A1c; Future -     metFORMIN HCl ER; Take 1 tablet (750 mg total) by mouth daily with breakfast.  Dispense: 180 tablet; Refill: 1 -     Olmesartan Medoxomil; Take 1 tablet (40 mg total) by mouth daily.  Dispense: 90 tablet; Refill: 1  Essential hypertension, benign -     Basic metabolic panel; Future -     EKG 12-Lead -     Olmesartan Medoxomil; Take 1 tablet (40 mg total) by mouth daily.  Dispense: 90 tablet; Refill: 1  Hyperlipidemia with target LDL less than 100- She has not achieved her LDL goal. -     Lipoprotein A (LPA); Future -     CK; Future -     Lipid panel; Future -     Atorvastatin Calcium; Take 1 tablet (40 mg total) by mouth daily.  Dispense: 90 tablet; Refill: 1 -     Nexlizet; Take 1 tablet by mouth  daily.  Dispense: 90 tablet; Refill: 1 -     CT CARDIAC SCORING (SELF PAY ONLY); Future  Psoriasis of scalp -     Ambulatory referral to Dermatology  Abnormal electrocardiogram (ECG) (EKG) -     ECHOCARDIOGRAM COMPLETE; Future -     CT CARDIAC SCORING (SELF PAY ONLY); Future  Insulin-requiring or dependent type II diabetes mellitus (HCC)- Her A1C is up to 7.8%. Will treat with metformin, basal insulin, and a GLP-1 agonist. -     Soliqua; Inject 20 Units into the skin daily.  Dispense: 18 mL; Refill: 1 -     Insulin Pen Needle; 1 Act by Does not apply route once a week.  Dispense: 100 each; Refill: 1 -     FreeStyle Libre 3 Sensor; 1 Act by Does not apply route daily. Place 1 sensor on the skin every 14 days. Use to check glucose continuously  Dispense: 2 each; Refill: 5 -     FreeStyle Libre 3 Reader; 1 Act by Does not apply route daily.  Dispense: 1 each; Refill: 3  High serum lipoprotein(a) -     CT CARDIAC SCORING (SELF PAY ONLY); Future     Follow-up: Return in about 6 months (around 12/26/2023).  Sanda Linger, MD

## 2023-06-26 MED ORDER — FREESTYLE LIBRE 3 READER DEVI
1.0000 | Freq: Every day | 3 refills | Status: DC
Start: 2023-06-26 — End: 2024-03-13

## 2023-06-26 MED ORDER — NEXLIZET 180-10 MG PO TABS
1.0000 | ORAL_TABLET | Freq: Every day | ORAL | 1 refills | Status: DC
Start: 2023-06-26 — End: 2023-06-26

## 2023-06-26 MED ORDER — METFORMIN HCL ER 750 MG PO TB24
750.0000 mg | ORAL_TABLET | Freq: Every day | ORAL | 1 refills | Status: DC
Start: 2023-06-26 — End: 2024-03-13

## 2023-06-26 MED ORDER — SOLIQUA 100-33 UNT-MCG/ML ~~LOC~~ SOPN
20.0000 [IU] | PEN_INJECTOR | Freq: Every day | SUBCUTANEOUS | 1 refills | Status: DC
Start: 2023-06-26 — End: 2023-10-22

## 2023-06-26 MED ORDER — ATORVASTATIN CALCIUM 40 MG PO TABS
40.0000 mg | ORAL_TABLET | Freq: Every day | ORAL | 1 refills | Status: DC
Start: 2023-06-26 — End: 2023-12-05

## 2023-06-26 MED ORDER — INSULIN PEN NEEDLE 32G X 6 MM MISC: 1.0000 | 1 refills | Status: DC

## 2023-06-26 MED ORDER — FREESTYLE LIBRE 3 SENSOR MISC
1.0000 | Freq: Every day | 5 refills | Status: DC
Start: 2023-06-26 — End: 2024-03-13

## 2023-06-26 MED ORDER — NEXLIZET 180-10 MG PO TABS
1.0000 | ORAL_TABLET | Freq: Every day | ORAL | 1 refills | Status: DC
Start: 2023-06-26 — End: 2024-01-07

## 2023-06-26 MED ORDER — OLMESARTAN MEDOXOMIL 40 MG PO TABS
40.0000 mg | ORAL_TABLET | Freq: Every day | ORAL | 1 refills | Status: DC
Start: 2023-06-26 — End: 2023-12-05

## 2023-06-29 DIAGNOSIS — E119 Type 2 diabetes mellitus without complications: Secondary | ICD-10-CM | POA: Insufficient documentation

## 2023-06-29 DIAGNOSIS — R9431 Abnormal electrocardiogram [ECG] [EKG]: Secondary | ICD-10-CM | POA: Insufficient documentation

## 2023-06-29 DIAGNOSIS — E7841 Elevated Lipoprotein(a): Secondary | ICD-10-CM | POA: Insufficient documentation

## 2023-07-03 ENCOUNTER — Telehealth: Payer: Self-pay | Admitting: Internal Medicine

## 2023-07-03 NOTE — Telephone Encounter (Signed)
Victorino Dike called from [facility] asking about direction for the Insulin Pen Needle. Please advise.  [phone] Reference Number [id number]

## 2023-07-03 NOTE — Telephone Encounter (Signed)
Called and spoke with Emmit Alexanders from [facility] in regards to the pen needle prescription that was sent in 8/6. Did advise her that the patients insulin is daily the pen needle usage would need to be once daily. She changed the prescription read it back to me and disconnected the all.

## 2023-07-12 ENCOUNTER — Telehealth: Payer: Self-pay

## 2023-07-12 ENCOUNTER — Other Ambulatory Visit (HOSPITAL_COMMUNITY): Payer: Self-pay

## 2023-07-12 NOTE — Telephone Encounter (Signed)
*  Primary  Pharmacy Patient Advocate Encounter   Received notification from Fax that prior authorization for Nexlizet 180-10MG  tablets  is required/requested.   Insurance verification completed.   The patient is insured through [employer] .   Per test claim: PA required; PA started via CoverMyMeds. KEY [id number] . Waiting for clinical questions to populate.    *Patient needs labs done for a CAC score.

## 2023-07-18 ENCOUNTER — Ambulatory Visit (HOSPITAL_BASED_OUTPATIENT_CLINIC_OR_DEPARTMENT_OTHER): Payer: Managed Care, Other (non HMO)

## 2023-07-18 DIAGNOSIS — R9431 Abnormal electrocardiogram [ECG] [EKG]: Secondary | ICD-10-CM | POA: Diagnosis not present

## 2023-07-19 ENCOUNTER — Other Ambulatory Visit: Payer: Self-pay | Admitting: Internal Medicine

## 2023-07-19 DIAGNOSIS — E118 Type 2 diabetes mellitus with unspecified complications: Secondary | ICD-10-CM

## 2023-07-19 LAB — ECHOCARDIOGRAM COMPLETE
Area-P 1/2: 3.77 cm2
S' Lateral: 2.82 cm

## 2023-07-24 ENCOUNTER — Other Ambulatory Visit: Payer: Self-pay | Admitting: Internal Medicine

## 2023-07-24 ENCOUNTER — Ambulatory Visit
Admission: RE | Admit: 2023-07-24 | Discharge: 2023-07-24 | Disposition: A | Discharge status: Home / Self Care | Payer: No Typology Code available for payment source | Source: Ambulatory Visit | Attending: Internal Medicine | Admitting: Internal Medicine

## 2023-07-24 DIAGNOSIS — I700 Atherosclerosis of aorta: Secondary | ICD-10-CM

## 2023-07-24 DIAGNOSIS — R931 Abnormal findings on diagnostic imaging of heart and coronary circulation: Secondary | ICD-10-CM | POA: Insufficient documentation

## 2023-07-24 DIAGNOSIS — R9431 Abnormal electrocardiogram [ECG] [EKG]: Secondary | ICD-10-CM

## 2023-07-24 DIAGNOSIS — E785 Hyperlipidemia, unspecified: Secondary | ICD-10-CM

## 2023-07-24 DIAGNOSIS — E7841 Elevated Lipoprotein(a): Secondary | ICD-10-CM

## 2023-07-26 ENCOUNTER — Other Ambulatory Visit (HOSPITAL_COMMUNITY): Payer: Self-pay

## 2023-07-26 ENCOUNTER — Ambulatory Visit: Payer: Managed Care, Other (non HMO) | Attending: Cardiology | Admitting: Cardiology

## 2023-07-26 ENCOUNTER — Telehealth: Payer: Self-pay

## 2023-07-26 ENCOUNTER — Encounter: Payer: Self-pay | Admitting: Cardiology

## 2023-07-26 VITALS — BP 126/82 | HR 63 | Ht 66.0 in | Wt 263.6 lb

## 2023-07-26 DIAGNOSIS — E785 Hyperlipidemia, unspecified: Secondary | ICD-10-CM

## 2023-07-26 DIAGNOSIS — I2510 Atherosclerotic heart disease of native coronary artery without angina pectoris: Secondary | ICD-10-CM

## 2023-07-26 DIAGNOSIS — I10 Essential (primary) hypertension: Secondary | ICD-10-CM

## 2023-07-26 DIAGNOSIS — R079 Chest pain, unspecified: Secondary | ICD-10-CM | POA: Diagnosis not present

## 2023-07-26 DIAGNOSIS — Z01812 Encounter for preprocedural laboratory examination: Secondary | ICD-10-CM

## 2023-07-26 MED ORDER — ASPIRIN 81 MG PO TBEC: 81.0000 mg | DELAYED_RELEASE_TABLET | Freq: Every day | ORAL | Status: AC

## 2023-07-26 MED ORDER — NITROGLYCERIN 0.4 MG SL SUBL: 0.4000 mg | SUBLINGUAL_TABLET | SUBLINGUAL | 1 refills | Status: DC | PRN

## 2023-07-26 NOTE — H&P (View-Only) (Signed)
Cardiology Office Note:    Date:  07/26/2023   ID:  Brianna Lambert, Brianna Lambert 11/18/1956, MRN [id number]  PCP:  Etta Grandchild, MD  Cardiologist:  None  Electrophysiologist:  None   Referring MD: Etta Grandchild, MD   Chief Complaint  Patient presents with   Coronary Artery Disease    History of Present Illness:    Brianna Lambert is a 67 y.o. female with a hx of T2DM, hypertension, hyperlipidemia, GERD who is referred by Dr. Yetta Barre for evaluation of CAD.  Echocardiogram 07/18/2023 showed normal biventricular function, no significant valvular disease.  Calcium score on 07/24/2023 was 1195 (99th percentile).  She reports has been having chest pain.  Describes as chest tightness that occurs when she exerts herself, such as when she vacuums.  Has just started over the last month or so.  States that she will rest and it resolves.  She denies any dyspnea, lightheadedness, syncope, or lower extremity edema.  Reports rare palpitations.  No smoking history.  Family's includes mother had A-fib and father had AVR in his 22s.   Past Medical History:  Diagnosis Date   Allergy    Arthritis    knees and spine   Asthma    environmental allergies   Diabetes mellitus without complication (HCC)    GERD (gastroesophageal reflux disease)    Hiatal hernia    Hyperlipidemia    Hypertension    Low back pain radiating to both legs 2012   Dr. Electa Sniff   Lumbar pain with radiation down right leg    PONV (postoperative nausea and vomiting)     Past Surgical History:  Procedure Laterality Date   BACK SURGERY  1992   lumbar lam   CESAREAN SECTION  1986; 1989   CHOLECYSTECTOMY  2003   COLONOSCOPY     KNEE ARTHROSCOPY  2000   rt knee repair   POSTERIOR FUSION LUMBAR SPINE  11/02/11   right PLIF; L2-L5   TONSILLECTOMY  1970's   TUBAL LIGATION  1989    Current Medications: Current Meds  Medication Sig   aspirin EC 81 MG tablet Take 1 tablet (81 mg total) by mouth daily. Swallow whole.    atorvastatin (LIPITOR) 40 MG tablet Take 1 tablet (40 mg total) by mouth daily.   Bempedoic Acid-Ezetimibe (NEXLIZET) 180-10 MG TABS Take 1 tablet by mouth daily.   Cholecalciferol (VITAMIN D3) 1.25 MG (50000 UT) CAPS TAKE 1 CAPSULE ONCE WEEKLY   Continuous Glucose Receiver (FREESTYLE LIBRE 3 READER) DEVI 1 Act by Does not apply route daily.   Continuous Glucose Sensor (FREESTYLE LIBRE 3 SENSOR) MISC 1 Act by Does not apply route daily. Place 1 sensor on the skin every 14 days. Use to check glucose continuously   Insulin Glargine-Lixisenatide (SOLIQUA) 100-33 UNT-MCG/ML SOPN Inject 20 Units into the skin daily.   Insulin Pen Needle 32G X 6 MM MISC 1 Act by Does not apply route once a week.   metFORMIN (GLUCOPHAGE-XR) 750 MG 24 hr tablet Take 1 tablet (750 mg total) by mouth daily with breakfast.   montelukast (SINGULAIR) 10 MG tablet TAKE 1 TABLET AT BEDTIME   nabumetone (RELAFEN) 500 MG tablet TAKE 1 TABLET TWICE A DAY AS NEEDED   nitroGLYCERIN (NITROSTAT) 0.4 MG SL tablet Place 1 tablet (0.4 mg total) under the tongue every 5 (five) minutes as needed for chest pain. Do not exceed 3 doses in 15 minutes. If symptoms are not better with second dose, then call 911.  olmesartan (BENICAR) 40 MG tablet Take 1 tablet (40 mg total) by mouth daily.     Allergies:   Crestor [rosuvastatin], Codeine, and Pravastatin   Social History   Socioeconomic History   Marital status: Widowed    Spouse name: Not on file   Number of children: Not on file   Years of education: Not on file   Highest education level: Associate degree: occupational, Scientist, product/process development, or vocational program  Occupational History   Not on file  Tobacco Use   Smoking status: Never   Smokeless tobacco: Never  Substance and Sexual Activity   Alcohol use: No    Alcohol/week: 0.0 standard drinks of alcohol   Drug use: No   Sexual activity: Never  Other Topics Concern   Not on file  Social History Narrative   Not on file   Social  Determinants of Health   Financial Resource Strain: Low Risk  (06/25/2023)   Overall Financial Resource Strain (CARDIA)    Difficulty of Paying Living Expenses: Not very hard  Food Insecurity: No Food Insecurity (06/25/2023)   Hunger Vital Sign    Worried About Running Out of Food in the Last Year: Never true    Ran Out of Food in the Last Year: Never true  Transportation Needs: No Transportation Needs (06/25/2023)   PRAPARE - Administrator, Civil Service (Medical): No    Lack of Transportation (Non-Medical): No  Physical Activity: Insufficiently Active (06/25/2023)   Exercise Vital Sign    Days of Exercise per Week: 2 days    Minutes of Exercise per Session: 30 min  Stress: No Stress Concern Present (06/25/2023)   [employer] of Occupational Health - Occupational Stress Questionnaire    Feeling of Stress : Not at all  Social Connections: Moderately Integrated (06/25/2023)   Social Connection and Isolation Panel [NHANES]    Frequency of Communication with Friends and Family: More than three times a week    Frequency of Social Gatherings with Friends and Family: More than three times a week    Attends Religious Services: More than 4 times per year    Active Member of [employer] or Organizations: Yes    Attends Banker Meetings: 1 to 4 times per year    Marital Status: Widowed     Family History: The patient's family history includes Diabetes in her father; Heart disease in her father. There is no history of Cancer, Anesthesia problems, Hypotension, Malignant hyperthermia, Pseudochol deficiency, Colon cancer, Esophageal cancer, Stomach cancer, or Rectal cancer.  ROS:   Please see the history of present illness.     All other systems reviewed and are negative.  EKGs/Labs/Other Studies Reviewed:    The following studies were reviewed today:   EKG:   07/26/2023: Normal sinus rhythm, rate 63, no ST abnormalities  Recent Labs: 10/24/2022: ALT 14; TSH 0.52 06/25/2023:  BUN 16; Creatinine, Ser 0.90; Hemoglobin 14.0; Platelets 310.0; Potassium 4.5; Sodium 137  Recent Lipid Panel    Component Value Date/Time   CHOL 261 (H) 06/25/2023 1611   TRIG 179.0 (H) 06/25/2023 1611   HDL 44.30 06/25/2023 1611   CHOLHDL 6 06/25/2023 1611   VLDL 35.8 06/25/2023 1611   LDLCALC 181 (H) 06/25/2023 1611   LDLDIRECT 155.6 07/11/2012 0911    Physical Exam:    VS:  BP 126/82   Pulse 63   Ht 5\' 6"  (1.676 m)   Wt 263 lb 9.6 oz (119.6 kg)   SpO2 99%  BMI 42.55 kg/m     Wt Readings from Last 3 Encounters:  07/26/23 263 lb 9.6 oz (119.6 kg)  06/25/23 267 lb (121.1 kg)  10/24/22 256 lb (116.1 kg)     GEN:  Well nourished, well developed in no acute distress HEENT: Normal NECK: No JVD; No carotid bruits LYMPHATICS: No lymphadenopathy CARDIAC: RRR, no murmurs, rubs, gallops RESPIRATORY:  Clear to auscultation without rales, wheezing or rhonchi  ABDOMEN: Soft, non-tender, non-distended MUSCULOSKELETAL:  No edema; No deformity  SKIN: Warm and dry NEUROLOGIC:  Alert and oriented x 3 PSYCHIATRIC:  Normal affect   ASSESSMENT:    1. Coronary artery disease involving native coronary artery of native heart, unspecified whether angina present   2. Chest pain of uncertain etiology   3. Essential hypertension, benign   4. Hyperlipidemia, unspecified hyperlipidemia type   5. Pre-procedure lab exam    PLAN:    CAD: Calcium score on 07/24/2023 was 1195 (99th percentile).  Echocardiogram 07/18/2023 showed normal biventricular function, no significant valvular disease.  -She is reported exertional chest pain concerning for typical angina.  Reports worsening symptoms over the last few weeks.  Recommend [facility] for further evaluation.  Risks and benefits of cardiac catheterization have been discussed with the patient.  These include bleeding, infection, kidney damage, stroke, heart attack, death.  The patient understands these risks and is willing to proceed. -Continue ASA,  statin -Sublingual nitroglycerin as needed  Hypertension: On olmesartan 40 mg daily.  Appears controlled  T2DM: On insulin.  A1c 7.8% on 06/25/2023  Hyperlipidemia: On atorvastatin 40 mg daily, LDL 181 on 06/25/2023.  Nexlizet 180-10 mg daily was added at that time.  Will follow-up lipid panel next month, suspect she will need PCSK9 inhibitor to get LDL to goal  RTC in 1 month   Medication Adjustments/Labs and Tests Ordered: Current medicines are reviewed at length with the patient today.  Concerns regarding medicines are outlined above.  Orders Placed This Encounter  Procedures   CBC   Basic metabolic panel   EKG 12-Lead   Meds ordered this encounter  Medications   nitroGLYCERIN (NITROSTAT) 0.4 MG SL tablet    Sig: Place 1 tablet (0.4 mg total) under the tongue every 5 (five) minutes as needed for chest pain. Do not exceed 3 doses in 15 minutes. If symptoms are not better with second dose, then call 911.    Dispense:  30 tablet    Refill:  1   aspirin EC 81 MG tablet    Sig: Take 1 tablet (81 mg total) by mouth daily. Swallow whole.    Patient Instructions  Medication Instructions:  Nitroglycerin 0.4 mg sublingual- See directions below NITROGLYCERIN  is a type of vasodilator. It relaxes blood vessels, increasing the blood and oxygen supply to your heart. This medicine is used to relieve chest pain caused by angina.  In an angina attack (CHEST PAIN), you should feel better within 5 minutes after your first dose. Do not swallow whole. Place tablet under your tongue. Sit down when taking this medicine. You can take a dose every 5 minutes up to a total of 3 doses. If you do not feel better or feel worse after 1-2 doses, call 9-1-1 at once. Do not take more than 3 doses in 15 minutes. Do not take your medicine more often than directed.  *If you need a refill on your cardiac medications before your next appointment, please call your pharmacy*   Lab Work: CBC, BMP- come tomorrow, no  appointment needed If you have labs (blood work) drawn today and your tests are completely normal, you will receive your results only by: MyChart Message (if you have MyChart) OR A paper copy in the mail If you have any lab test that is abnormal or we need to change your treatment, we will call you to review the results.   Testing/Procedures:  [place] A DEPT OF [place]. [facility] AT [facility] [address] [place] [address] [place] [address] Dept: [phone] Loc: [phone]  Jevonne Frase  07/26/2023  You are scheduled for a Cardiac Catheterization on Tuesday, September 10 with Dr. Bryan Lemma.  1. Please arrive at the [facility] (Main Entrance A) at [facility]: [address] [place], [place] [address] at 8:30 AM (This time is 2 hour(s) before your procedure to ensure your preparation). Free valet parking service is available. You will check in at ADMITTING. The support person will be asked to wait in the waiting room.  It is OK to have someone drop you off and come back when you are ready to be discharged.    Special note: Every effort is made to have your procedure done on time. Please understand that emergencies sometimes delay scheduled procedures.  2. Diet: Do not eat solid foods after midnight.  The patient may have clear liquids until 5am upon the day of the procedure.  3. Labs: You will need to have blood drawn on 07/27/23  4. Medication instructions in preparation for your procedure:   Contrast Allergy: No  Do not take Diabetes Med Glucophage (Metformin) on the day of the procedure and HOLD 48 HOURS AFTER THE PROCEDURE.  On the morning of your procedure, take your Aspirin 81 mg and any morning medicines NOT listed above.  You may use sips of water.  5. Plan to go home the same day, you will only stay overnight if medically necessary. 6. Bring a current list of your medications and current insurance  cards. 7. You MUST have a responsible person to drive you home. 8. Someone MUST be with you the first 24 hours after you arrive home or your discharge will be delayed. 9. Please wear clothes that are easy to get on and off and wear slip-on shoes.  Thank you for allowing [place] to care for you!   -- [facility]    Follow-Up: At [facility], you and your health needs are our priority.  As part of our continuing mission to provide you with exceptional heart care, we have created designated Provider Care Teams.  These Care Teams include your primary Cardiologist (physician) and Advanced Practice Providers (APPs -  Physician Assistants and Nurse Practitioners) who all work together to provide you with the care you need, when you need it.  We recommend signing up for the patient portal called "MyChart".  Sign up information is provided on this After Visit Summary.  MyChart is used to connect with patients for Virtual Visits (Telemedicine).  Patients are able to view lab/test results, encounter notes, upcoming appointments, etc.  Non-urgent messages can be sent to your provider as well.   To learn more about what you can do with MyChart, go to [email or web address].    Your next appointment:   1 month(s)  Provider:   Dr Bjorn Pippin    Signed, Little Ishikawa, MD  07/26/2023 10:09 PM    [facility]

## 2023-07-26 NOTE — Progress Notes (Signed)
Cardiology Office Note:    Date:  07/26/2023   ID:  Brianna Lambert, Brianna Lambert 11/18/1956, MRN [id number]  PCP:  Etta Grandchild, MD  Cardiologist:  None  Electrophysiologist:  None   Referring MD: Etta Grandchild, MD   Chief Complaint  Patient presents with   Coronary Artery Disease    History of Present Illness:    Brianna Lambert is a 67 y.o. female with a hx of T2DM, hypertension, hyperlipidemia, GERD who is referred by Dr. Yetta Barre for evaluation of CAD.  Echocardiogram 07/18/2023 showed normal biventricular function, no significant valvular disease.  Calcium score on 07/24/2023 was 1195 (99th percentile).  She reports has been having chest pain.  Describes as chest tightness that occurs when she exerts herself, such as when she vacuums.  Has just started over the last month or so.  States that she will rest and it resolves.  She denies any dyspnea, lightheadedness, syncope, or lower extremity edema.  Reports rare palpitations.  No smoking history.  Family's includes mother had A-fib and father had AVR in his 22s.   Past Medical History:  Diagnosis Date   Allergy    Arthritis    knees and spine   Asthma    environmental allergies   Diabetes mellitus without complication (HCC)    GERD (gastroesophageal reflux disease)    Hiatal hernia    Hyperlipidemia    Hypertension    Low back pain radiating to both legs 2012   Dr. Electa Sniff   Lumbar pain with radiation down right leg    PONV (postoperative nausea and vomiting)     Past Surgical History:  Procedure Laterality Date   BACK SURGERY  1992   lumbar lam   CESAREAN SECTION  1986; 1989   CHOLECYSTECTOMY  2003   COLONOSCOPY     KNEE ARTHROSCOPY  2000   rt knee repair   POSTERIOR FUSION LUMBAR SPINE  11/02/11   right PLIF; L2-L5   TONSILLECTOMY  1970's   TUBAL LIGATION  1989    Current Medications: Current Meds  Medication Sig   aspirin EC 81 MG tablet Take 1 tablet (81 mg total) by mouth daily. Swallow whole.    atorvastatin (LIPITOR) 40 MG tablet Take 1 tablet (40 mg total) by mouth daily.   Bempedoic Acid-Ezetimibe (NEXLIZET) 180-10 MG TABS Take 1 tablet by mouth daily.   Cholecalciferol (VITAMIN D3) 1.25 MG (50000 UT) CAPS TAKE 1 CAPSULE ONCE WEEKLY   Continuous Glucose Receiver (FREESTYLE LIBRE 3 READER) DEVI 1 Act by Does not apply route daily.   Continuous Glucose Sensor (FREESTYLE LIBRE 3 SENSOR) MISC 1 Act by Does not apply route daily. Place 1 sensor on the skin every 14 days. Use to check glucose continuously   Insulin Glargine-Lixisenatide (SOLIQUA) 100-33 UNT-MCG/ML SOPN Inject 20 Units into the skin daily.   Insulin Pen Needle 32G X 6 MM MISC 1 Act by Does not apply route once a week.   metFORMIN (GLUCOPHAGE-XR) 750 MG 24 hr tablet Take 1 tablet (750 mg total) by mouth daily with breakfast.   montelukast (SINGULAIR) 10 MG tablet TAKE 1 TABLET AT BEDTIME   nabumetone (RELAFEN) 500 MG tablet TAKE 1 TABLET TWICE A DAY AS NEEDED   nitroGLYCERIN (NITROSTAT) 0.4 MG SL tablet Place 1 tablet (0.4 mg total) under the tongue every 5 (five) minutes as needed for chest pain. Do not exceed 3 doses in 15 minutes. If symptoms are not better with second dose, then call 911.  olmesartan (BENICAR) 40 MG tablet Take 1 tablet (40 mg total) by mouth daily.     Allergies:   Crestor [rosuvastatin], Codeine, and Pravastatin   Social History   Socioeconomic History   Marital status: Widowed    Spouse name: Not on file   Number of children: Not on file   Years of education: Not on file   Highest education level: Associate degree: occupational, Scientist, product/process development, or vocational program  Occupational History   Not on file  Tobacco Use   Smoking status: Never   Smokeless tobacco: Never  Substance and Sexual Activity   Alcohol use: No    Alcohol/week: 0.0 standard drinks of alcohol   Drug use: No   Sexual activity: Never  Other Topics Concern   Not on file  Social History Narrative   Not on file   Social  Determinants of Health   Financial Resource Strain: Low Risk  (06/25/2023)   Overall Financial Resource Strain (CARDIA)    Difficulty of Paying Living Expenses: Not very hard  Food Insecurity: No Food Insecurity (06/25/2023)   Hunger Vital Sign    Worried About Running Out of Food in the Last Year: Never true    Ran Out of Food in the Last Year: Never true  Transportation Needs: No Transportation Needs (06/25/2023)   PRAPARE - Administrator, Civil Service (Medical): No    Lack of Transportation (Non-Medical): No  Physical Activity: Insufficiently Active (06/25/2023)   Exercise Vital Sign    Days of Exercise per Week: 2 days    Minutes of Exercise per Session: 30 min  Stress: No Stress Concern Present (06/25/2023)   [employer] of Occupational Health - Occupational Stress Questionnaire    Feeling of Stress : Not at all  Social Connections: Moderately Integrated (06/25/2023)   Social Connection and Isolation Panel [NHANES]    Frequency of Communication with Friends and Family: More than three times a week    Frequency of Social Gatherings with Friends and Family: More than three times a week    Attends Religious Services: More than 4 times per year    Active Member of [employer] or Organizations: Yes    Attends Banker Meetings: 1 to 4 times per year    Marital Status: Widowed     Family History: The patient's family history includes Diabetes in her father; Heart disease in her father. There is no history of Cancer, Anesthesia problems, Hypotension, Malignant hyperthermia, Pseudochol deficiency, Colon cancer, Esophageal cancer, Stomach cancer, or Rectal cancer.  ROS:   Please see the history of present illness.     All other systems reviewed and are negative.  EKGs/Labs/Other Studies Reviewed:    The following studies were reviewed today:   EKG:   07/26/2023: Normal sinus rhythm, rate 63, no ST abnormalities  Recent Labs: 10/24/2022: ALT 14; TSH 0.52 06/25/2023:  BUN 16; Creatinine, Ser 0.90; Hemoglobin 14.0; Platelets 310.0; Potassium 4.5; Sodium 137  Recent Lipid Panel    Component Value Date/Time   CHOL 261 (H) 06/25/2023 1611   TRIG 179.0 (H) 06/25/2023 1611   HDL 44.30 06/25/2023 1611   CHOLHDL 6 06/25/2023 1611   VLDL 35.8 06/25/2023 1611   LDLCALC 181 (H) 06/25/2023 1611   LDLDIRECT 155.6 07/11/2012 0911    Physical Exam:    VS:  BP 126/82   Pulse 63   Ht 5\' 6"  (1.676 m)   Wt 263 lb 9.6 oz (119.6 kg)   SpO2 99%  BMI 42.55 kg/m     Wt Readings from Last 3 Encounters:  07/26/23 263 lb 9.6 oz (119.6 kg)  06/25/23 267 lb (121.1 kg)  10/24/22 256 lb (116.1 kg)     GEN:  Well nourished, well developed in no acute distress HEENT: Normal NECK: No JVD; No carotid bruits LYMPHATICS: No lymphadenopathy CARDIAC: RRR, no murmurs, rubs, gallops RESPIRATORY:  Clear to auscultation without rales, wheezing or rhonchi  ABDOMEN: Soft, non-tender, non-distended MUSCULOSKELETAL:  No edema; No deformity  SKIN: Warm and dry NEUROLOGIC:  Alert and oriented x 3 PSYCHIATRIC:  Normal affect   ASSESSMENT:    1. Coronary artery disease involving native coronary artery of native heart, unspecified whether angina present   2. Chest pain of uncertain etiology   3. Essential hypertension, benign   4. Hyperlipidemia, unspecified hyperlipidemia type   5. Pre-procedure lab exam    PLAN:    CAD: Calcium score on 07/24/2023 was 1195 (99th percentile).  Echocardiogram 07/18/2023 showed normal biventricular function, no significant valvular disease.  -She is reported exertional chest pain concerning for typical angina.  Reports worsening symptoms over the last few weeks.  Recommend [facility] for further evaluation.  Risks and benefits of cardiac catheterization have been discussed with the patient.  These include bleeding, infection, kidney damage, stroke, heart attack, death.  The patient understands these risks and is willing to proceed. -Continue ASA,  statin -Sublingual nitroglycerin as needed  Hypertension: On olmesartan 40 mg daily.  Appears controlled  T2DM: On insulin.  A1c 7.8% on 06/25/2023  Hyperlipidemia: On atorvastatin 40 mg daily, LDL 181 on 06/25/2023.  Nexlizet 180-10 mg daily was added at that time.  Will follow-up lipid panel next month, suspect she will need PCSK9 inhibitor to get LDL to goal  RTC in 1 month   Medication Adjustments/Labs and Tests Ordered: Current medicines are reviewed at length with the patient today.  Concerns regarding medicines are outlined above.  Orders Placed This Encounter  Procedures   CBC   Basic metabolic panel   EKG 12-Lead   Meds ordered this encounter  Medications   nitroGLYCERIN (NITROSTAT) 0.4 MG SL tablet    Sig: Place 1 tablet (0.4 mg total) under the tongue every 5 (five) minutes as needed for chest pain. Do not exceed 3 doses in 15 minutes. If symptoms are not better with second dose, then call 911.    Dispense:  30 tablet    Refill:  1   aspirin EC 81 MG tablet    Sig: Take 1 tablet (81 mg total) by mouth daily. Swallow whole.    Patient Instructions  Medication Instructions:  Nitroglycerin 0.4 mg sublingual- See directions below NITROGLYCERIN  is a type of vasodilator. It relaxes blood vessels, increasing the blood and oxygen supply to your heart. This medicine is used to relieve chest pain caused by angina.  In an angina attack (CHEST PAIN), you should feel better within 5 minutes after your first dose. Do not swallow whole. Place tablet under your tongue. Sit down when taking this medicine. You can take a dose every 5 minutes up to a total of 3 doses. If you do not feel better or feel worse after 1-2 doses, call 9-1-1 at once. Do not take more than 3 doses in 15 minutes. Do not take your medicine more often than directed.  *If you need a refill on your cardiac medications before your next appointment, please call your pharmacy*   Lab Work: CBC, BMP- come tomorrow, no  appointment needed If you have labs (blood work) drawn today and your tests are completely normal, you will receive your results only by: MyChart Message (if you have MyChart) OR A paper copy in the mail If you have any lab test that is abnormal or we need to change your treatment, we will call you to review the results.   Testing/Procedures:  [place] A DEPT OF [place]. [facility] AT [facility] [address] [place] [address] [place] [address] Dept: [phone] Loc: [phone]  Jevonne Frase  07/26/2023  You are scheduled for a Cardiac Catheterization on Tuesday, September 10 with Dr. Bryan Lemma.  1. Please arrive at the [facility] (Main Entrance A) at [facility]: [address] [place], [place] [address] at 8:30 AM (This time is 2 hour(s) before your procedure to ensure your preparation). Free valet parking service is available. You will check in at ADMITTING. The support person will be asked to wait in the waiting room.  It is OK to have someone drop you off and come back when you are ready to be discharged.    Special note: Every effort is made to have your procedure done on time. Please understand that emergencies sometimes delay scheduled procedures.  2. Diet: Do not eat solid foods after midnight.  The patient may have clear liquids until 5am upon the day of the procedure.  3. Labs: You will need to have blood drawn on 07/27/23  4. Medication instructions in preparation for your procedure:   Contrast Allergy: No  Do not take Diabetes Med Glucophage (Metformin) on the day of the procedure and HOLD 48 HOURS AFTER THE PROCEDURE.  On the morning of your procedure, take your Aspirin 81 mg and any morning medicines NOT listed above.  You may use sips of water.  5. Plan to go home the same day, you will only stay overnight if medically necessary. 6. Bring a current list of your medications and current insurance  cards. 7. You MUST have a responsible person to drive you home. 8. Someone MUST be with you the first 24 hours after you arrive home or your discharge will be delayed. 9. Please wear clothes that are easy to get on and off and wear slip-on shoes.  Thank you for allowing [place] to care for you!   -- [place] [facility]    Follow-Up: At [facility], you and your health needs are our priority.  As part of our continuing mission to provide you with exceptional heart care, we have created designated Provider Care Teams.  These Care Teams include your primary Cardiologist (physician) and Advanced Practice Providers (APPs -  Physician Assistants and Nurse Practitioners) who all work together to provide you with the care you need, when you need it.  We recommend signing up for the patient portal called "MyChart".  Sign up information is provided on this After Visit Summary.  MyChart is used to connect with patients for Virtual Visits (Telemedicine).  Patients are able to view lab/test results, encounter notes, upcoming appointments, etc.  Non-urgent messages can be sent to your provider as well.   To learn more about what you can do with MyChart, go to [email or web address].    Your next appointment:   1 month(s)  Provider:   Dr Bjorn Pippin    Signed, Little Ishikawa, MD  07/26/2023 10:09 PM    [facility]

## 2023-07-26 NOTE — Telephone Encounter (Signed)
Pharmacy Patient Advocate Encounter  Received notification from [facility] that Prior Authorization for Nexlizet 180-10mg  tabs has been APPROVED from 06/26/23 to 07/24/24. Ran test claim, Copay is $50 for 30 day supply. This test claim was processed through [facility]- copay amounts may vary at other pharmacies due to pharmacy/plan contracts, or as the patient moves through the different stages of their insurance plan.   PA #/Case ID/Reference #: [id number]

## 2023-07-26 NOTE — Patient Instructions (Signed)
Medication Instructions:  Nitroglycerin 0.4 mg sublingual- See directions below NITROGLYCERIN  is a type of vasodilator. It relaxes blood vessels, increasing the blood and oxygen supply to your heart. This medicine is used to relieve chest pain caused by angina.  In an angina attack (CHEST PAIN), you should feel better within 5 minutes after your first dose. Do not swallow whole. Place tablet under your tongue. Sit down when taking this medicine. You can take a dose every 5 minutes up to a total of 3 doses. If you do not feel better or feel worse after 1-2 doses, call 9-1-1 at once. Do not take more than 3 doses in 15 minutes. Do not take your medicine more often than directed.  *If you need a refill on your cardiac medications before your next appointment, please call your pharmacy*   Lab Work: CBC, BMP- come tomorrow, no appointment needed If you have labs (blood work) drawn today and your tests are completely normal, you will receive your results only by: MyChart Message (if you have MyChart) OR A paper copy in the mail If you have any lab test that is abnormal or we need to change your treatment, we will call you to review the results.   Testing/Procedures:  [place] A DEPT OF [place]. [facility] AT [facility] [address] [place] [address] [place] [address] Dept: [phone] Loc: [phone]  Brianna Lambert  07/26/2023  You are scheduled for a Cardiac Catheterization on Tuesday, September 10 with Dr. Bryan Lemma.  1. Please arrive at the [facility] (Main Entrance A) at [facility]: [address] [place], [place] [address] at 8:30 AM (This time is 2 hour(s) before your procedure to ensure your preparation). Free valet parking service is available. You will check in at ADMITTING. The support person will be asked to wait in the waiting room.  It is OK to have someone drop you off and come back when you are ready to be  discharged.    Special note: Every effort is made to have your procedure done on time. Please understand that emergencies sometimes delay scheduled procedures.  2. Diet: Do not eat solid foods after midnight.  The patient may have clear liquids until 5am upon the day of the procedure.  3. Labs: You will need to have blood drawn on 07/27/23  4. Medication instructions in preparation for your procedure:   Contrast Allergy: No  Do not take Diabetes Med Glucophage (Metformin) on the day of the procedure and HOLD 48 HOURS AFTER THE PROCEDURE.  On the morning of your procedure, take your Aspirin 81 mg and any morning medicines NOT listed above.  You may use sips of water.  5. Plan to go home the same day, you will only stay overnight if medically necessary. 6. Bring a current list of your medications and current insurance cards. 7. You MUST have a responsible person to drive you home. 8. Someone MUST be with you the first 24 hours after you arrive home or your discharge will be delayed. 9. Please wear clothes that are easy to get on and off and wear slip-on shoes.  Thank you for allowing [place] to care for you!   -- [place] [facility] services    Follow-Up: At [facility], you and your health needs are our priority.  As part of our continuing mission to provide you with exceptional heart care, we have created designated Provider Care  Teams.  These Care Teams include your primary Cardiologist (physician) and Advanced Practice Providers (APPs -  Physician Assistants and Nurse Practitioners) who all work together to provide you with the care you need, when you need it.  We recommend signing up for the patient portal called "MyChart".  Sign up information is provided on this After Visit Summary.  MyChart is used to connect with patients for Virtual Visits (Telemedicine).  Patients are able to view lab/test results, encounter notes, upcoming appointments, etc.  Non-urgent  messages can be sent to your provider as well.   To learn more about what you can do with MyChart, go to [email or web address].    Your next appointment:   1 month(s)  Provider:   Dr Bjorn Pippin

## 2023-07-28 LAB — CBC
Hematocrit: 40.1 % (ref 34.0–46.6)
Hemoglobin: 13.8 g/dL (ref 11.1–15.9)
MCH: 30.7 pg (ref 26.6–33.0)
MCHC: 34.4 g/dL (ref 31.5–35.7)
MCV: 89 fL (ref 79–97)
Platelets: 306 10*3/uL (ref 150–450)
RBC: 4.5 x10E6/uL (ref 3.77–5.28)
RDW: 12.6 % (ref 11.7–15.4)
WBC: 7.8 10*3/uL (ref 3.4–10.8)

## 2023-07-28 LAB — BASIC METABOLIC PANEL
BUN/Creatinine Ratio: 16 (ref 12–28)
BUN: 14 mg/dL (ref 8–27)
CO2: 23 mmol/L (ref 20–29)
Calcium: 9.4 mg/dL (ref 8.7–10.3)
Chloride: 101 mmol/L (ref 96–106)
Creatinine, Ser: 0.85 mg/dL (ref 0.57–1.00)
Glucose: 193 mg/dL — ABNORMAL HIGH (ref 70–99)
Potassium: 4.1 mmol/L (ref 3.5–5.2)
Sodium: 138 mmol/L (ref 134–144)
eGFR: 75 mL/min/{1.73_m2} (ref >59)

## 2023-07-30 ENCOUNTER — Telehealth: Payer: Self-pay | Admitting: *Deleted

## 2023-07-30 NOTE — Telephone Encounter (Signed)
Cardiac Catheterization scheduled at [facility] for: Tuesday July 31, 2023 10:30 AM Arrival time [facility] Main Entrance A at: 8:30 AM  Nothing to eat after midnight prior to procedure, clear liquids until 5 AM day of procedure.  Medication instructions: -Hold:  Metformin-day of procedure and 48 hours post procedure  Insulin-pt reports she has not started taking this yet -Other usual morning medications can be taken with sips of water including aspirin 81 mg.  Plan to go home the same day, you will only stay overnight if medically necessary.  You must have responsible adult to drive you home.  Someone must be with you the first 24 hours after you arrive home.  Reviewed procedure instructions with patient.

## 2023-07-31 ENCOUNTER — Encounter (HOSPITAL_COMMUNITY)
Admission: RE | Disposition: A | Discharge status: Home / Self Care | Payer: Self-pay | Source: Home / Self Care | Attending: Cardiology

## 2023-07-31 ENCOUNTER — Ambulatory Visit (HOSPITAL_COMMUNITY)
Admission: RE | Admit: 2023-07-31 | Discharge: 2023-07-31 | Disposition: A | Discharge status: Home / Self Care | Payer: Managed Care, Other (non HMO) | Attending: Cardiology | Admitting: Cardiology

## 2023-07-31 ENCOUNTER — Other Ambulatory Visit: Payer: Self-pay

## 2023-07-31 DIAGNOSIS — Z79899 Other long term (current) drug therapy: Secondary | ICD-10-CM | POA: Insufficient documentation

## 2023-07-31 DIAGNOSIS — I209 Angina pectoris, unspecified: Secondary | ICD-10-CM | POA: Diagnosis present

## 2023-07-31 DIAGNOSIS — K219 Gastro-esophageal reflux disease without esophagitis: Secondary | ICD-10-CM | POA: Diagnosis not present

## 2023-07-31 DIAGNOSIS — E119 Type 2 diabetes mellitus without complications: Secondary | ICD-10-CM

## 2023-07-31 DIAGNOSIS — Z794 Long term (current) use of insulin: Secondary | ICD-10-CM | POA: Diagnosis not present

## 2023-07-31 DIAGNOSIS — I10 Essential (primary) hypertension: Secondary | ICD-10-CM | POA: Diagnosis present

## 2023-07-31 DIAGNOSIS — Z7984 Long term (current) use of oral hypoglycemic drugs: Secondary | ICD-10-CM | POA: Insufficient documentation

## 2023-07-31 DIAGNOSIS — Z7982 Long term (current) use of aspirin: Secondary | ICD-10-CM | POA: Diagnosis not present

## 2023-07-31 DIAGNOSIS — E785 Hyperlipidemia, unspecified: Secondary | ICD-10-CM | POA: Diagnosis not present

## 2023-07-31 DIAGNOSIS — I25119 Atherosclerotic heart disease of native coronary artery with unspecified angina pectoris: Secondary | ICD-10-CM | POA: Diagnosis present

## 2023-07-31 HISTORY — PX: LEFT HEART CATH AND CORONARY ANGIOGRAPHY: CATH118249

## 2023-07-31 LAB — GLUCOSE, CAPILLARY
Glucose-Capillary: 186 mg/dL — ABNORMAL HIGH (ref 70–99)
Glucose-Capillary: 138 mg/dL — ABNORMAL HIGH (ref 70–99)

## 2023-07-31 SURGERY — LEFT HEART CATH AND CORONARY ANGIOGRAPHY
Anesthesia: LOCAL

## 2023-07-31 MED ORDER — VERAPAMIL HCL 2.5 MG/ML IV SOLN
INTRAVENOUS | Status: AC
  Filled 2023-07-31: qty 2

## 2023-07-31 MED ORDER — LIDOCAINE HCL (PF) 1 % IJ SOLN
INTRAMUSCULAR | Status: DC | PRN
  Administered 2023-07-31: 2 mL

## 2023-07-31 MED ORDER — VERAPAMIL HCL 2.5 MG/ML IV SOLN
INTRAVENOUS | Status: DC | PRN
  Administered 2023-07-31: 10 mL via INTRA_ARTERIAL

## 2023-07-31 MED ORDER — SODIUM CHLORIDE 0.9% FLUSH: 3.0000 mL | INTRAVENOUS | Status: DC | PRN

## 2023-07-31 MED ORDER — FENTANYL CITRATE (PF) 100 MCG/2ML IJ SOLN
INTRAMUSCULAR | Status: DC | PRN
  Administered 2023-07-31: 25 ug via INTRAVENOUS

## 2023-07-31 MED ORDER — AMLODIPINE BESYLATE 2.5 MG PO TABS
2.5000 mg | ORAL_TABLET | Freq: Every day | ORAL | 11 refills | Status: DC
Start: 2023-07-31 — End: 2023-08-07

## 2023-07-31 MED ORDER — HEPARIN (PORCINE) IN NACL 1000-0.9 UT/500ML-% IV SOLN
INTRAVENOUS | Status: DC | PRN
  Administered 2023-07-31 (×2): 500 mL

## 2023-07-31 MED ORDER — SODIUM CHLORIDE 0.9 % WEIGHT BASED INFUSION
3.0000 mL/kg/h | INTRAVENOUS | Status: AC
  Administered 2023-07-31: 3 mL/kg/h via INTRAVENOUS

## 2023-07-31 MED ORDER — HYDRALAZINE HCL 20 MG/ML IJ SOLN: 10.0000 mg | INTRAMUSCULAR | Status: DC | PRN

## 2023-07-31 MED ORDER — SODIUM CHLORIDE 0.9% FLUSH: 3.0000 mL | Freq: Two times a day (BID) | INTRAVENOUS | Status: DC

## 2023-07-31 MED ORDER — FENTANYL CITRATE (PF) 100 MCG/2ML IJ SOLN
INTRAMUSCULAR | Status: AC
  Filled 2023-07-31: qty 2

## 2023-07-31 MED ORDER — SODIUM CHLORIDE 0.9 % IV SOLN: 250.0000 mL | INTRAVENOUS | Status: DC | PRN

## 2023-07-31 MED ORDER — ONDANSETRON HCL 4 MG/2ML IJ SOLN: 4.0000 mg | Freq: Four times a day (QID) | INTRAMUSCULAR | Status: DC | PRN

## 2023-07-31 MED ORDER — LABETALOL HCL 5 MG/ML IV SOLN: 10.0000 mg | INTRAVENOUS | Status: DC | PRN

## 2023-07-31 MED ORDER — SODIUM CHLORIDE 0.9 % WEIGHT BASED INFUSION: 1.0000 mL/kg/h | INTRAVENOUS | Status: DC

## 2023-07-31 MED ORDER — ACETAMINOPHEN 325 MG PO TABS: 650.0000 mg | ORAL_TABLET | ORAL | Status: DC | PRN

## 2023-07-31 MED ORDER — MIDAZOLAM HCL 2 MG/2ML IJ SOLN
INTRAMUSCULAR | Status: DC | PRN
  Administered 2023-07-31: 1 mg via INTRAVENOUS

## 2023-07-31 MED ORDER — ASPIRIN 81 MG PO CHEW: 81.0000 mg | CHEWABLE_TABLET | ORAL | Status: DC

## 2023-07-31 MED ORDER — HEPARIN SODIUM (PORCINE) 1000 UNIT/ML IJ SOLN
INTRAMUSCULAR | Status: AC
  Filled 2023-07-31: qty 10

## 2023-07-31 MED ORDER — HEPARIN SODIUM (PORCINE) 1000 UNIT/ML IJ SOLN
INTRAMUSCULAR | Status: DC | PRN
  Administered 2023-07-31: 6000 [IU] via INTRAVENOUS

## 2023-07-31 MED ORDER — IOHEXOL 350 MG/ML SOLN
INTRAVENOUS | Status: DC | PRN
  Administered 2023-07-31: 180 mL

## 2023-07-31 MED ORDER — MIDAZOLAM HCL 2 MG/2ML IJ SOLN
INTRAMUSCULAR | Status: AC
  Filled 2023-07-31: qty 2

## 2023-07-31 SURGICAL SUPPLY — 8 items
CATH INFINITI AMBI 5FR TG (CATHETERS) IMPLANT
DEVICE RAD COMP TR BAND LRG (VASCULAR PRODUCTS) IMPLANT
GLIDESHEATH SLEND SS 6F .021 (SHEATH) IMPLANT
GUIDEWIRE INQWIRE 1.5J.035X260 (WIRE) IMPLANT
INQWIRE 1.5J .035X260CM (WIRE) ×1
PACK CARDIAC CATHETERIZATION (CUSTOM PROCEDURE TRAY) ×2 IMPLANT
SET ATX-X65L (MISCELLANEOUS) IMPLANT
SHEATH PROBE COVER 6X72 (BAG) IMPLANT

## 2023-07-31 NOTE — Discharge Instructions (Signed)
Radial Site Care  This sheet gives you information about how to care for yourself after your procedure. Your health care provider may also give you more specific instructions. If you have problems or questions, contact your health care provider. What can I expect after the procedure? After the procedure, it is common to have: Bruising and tenderness at the catheter insertion area. Follow these instructions at home: Medicines Take over-the-counter and prescription medicines only as told by your health care provider. Insertion site care Follow instructions from your health care provider about how to take care of your insertion site. Make sure you: Wash your hands with soap and water before you remove your bandage (dressing). If soap and water are not available, use hand sanitizer. May remove dressing in 24 hours. Check your insertion site every day for signs of infection. Check for: Redness, swelling, or pain. Fluid or blood. Pus or a bad smell. Warmth. Do no take baths, swim, or use a hot tub for 5 days. You may shower 24-48 hours after the procedure. Remove the dressing and gently wash the site with plain soap and water. Pat the area dry with a clean towel. Do not rub the site. That could cause bleeding. Do not apply powder or lotion to the site. Activity  For 24 hours after the procedure, or as directed by your health care provider: Do not flex or bend the affected arm. Do not push or pull heavy objects with the affected arm. Do not drive yourself home from the hospital or clinic. You may drive 24 hours after the procedure. Do not operate machinery or power tools. KEEP ARM ELEVATED THE REMAINDER OF THE DAY. Do not push, pull or lift anything that is heavier than 10 lb for 5 days. Ask your health care provider when it is okay to: Return to work or school. Resume usual physical activities or sports. Resume sexual activity. General instructions If the catheter site starts to  bleed, raise your arm and put firm pressure on the site. If the bleeding does not stop, get help right away. This is a medical emergency. DRINK PLENTY OF FLUIDS FOR THE NEXT 2-3 DAYS. No alcohol consumption for 24 hours after receiving sedation. If you went home on the same day as your procedure, a responsible adult should be with you for the first 24 hours after you arrive home. Keep all follow-up visits as told by your health care provider. This is important. Contact a health care provider if: You have a fever. You have redness, swelling, or yellow drainage around your insertion site. Get help right away if: You have unusual pain at the radial site. The catheter insertion area swells very fast. The insertion area is bleeding, and the bleeding does not stop when you hold steady pressure on the area. Your arm or hand becomes pale, cool, tingly, or numb. These symptoms may represent a serious problem that is an emergency. Do not wait to see if the symptoms will go away. Get medical help right away. Call your local emergency services (911 in the [place] Do not drive yourself to the hospital. Summary After the procedure, it is common to have bruising and tenderness at the site. Follow instructions from your health care provider about how to take care of your radial site wound. Check the wound every day for signs of infection.  This information is not intended to replace advice given to you by your health care provider. Make sure you discuss any questions you have with   your health care provider. Document Revised: 12/12/2017 Document Reviewed: 12/12/2017 Elsevier Patient Education  2020 Elsevier Inc.  

## 2023-07-31 NOTE — Interval H&P Note (Signed)
History and Physical Interval Note:  07/31/2023 12:08 PM  Brianna Lambert  has presented today for surgery, with the diagnosis of chest pain - concerning for Class III Angina.   The various methods of treatment have been discussed with the patient and family. After consideration of risks, benefits and other options for treatment, the patient has consented to  Procedure(s): LEFT HEART CATH AND CORONARY ANGIOGRAPHY (N/A) . PERCUTANEOUS CORONARY INTERVENTION   as a surgical intervention.  The patient's history has been reviewed, patient examined, no change in status, stable for surgery.  I have reviewed the patient's chart and labs.  Questions were answered to the patient's satisfaction.     Cath Lab Visit (complete for each Cath Lab visit)  Clinical Evaluation Leading to the Procedure:   ACS: No.  Non-ACS:    Anginal Classification: CCS III  Anti-ischemic medical therapy: Minimal Therapy (1 class of medications)  Non-Invasive Test Results: No non-invasive testing performed; cac > 1000  Prior CABG: No previous CABG      Bryan Lemma

## 2023-08-01 ENCOUNTER — Encounter (HOSPITAL_COMMUNITY): Payer: Self-pay | Admitting: Cardiology

## 2023-08-06 NOTE — Progress Notes (Unsigned)
Cardiology Office Note:    Date:  08/07/2023   ID:  Brianna, Lambert 13-Apr-1956, MRN [id number]  PCP:  Brianna Grandchild, MD  Cardiologist:  None  Electrophysiologist:  None   Referring MD: Brianna Grandchild, MD   Chief Complaint  Patient presents with   Coronary Artery Disease   Advice Only    WatchPAT Issued    History of Present Illness:    Brianna Lambert is a 67 y.o. female with a hx of CAD, T2DM, hypertension, hyperlipidemia, GERD who presents for follow-up.  She was referred by Dr. Yetta Lambert for evaluation of CAD, initially seen on 07/26/2023.  Echocardiogram 07/18/2023 showed normal biventricular function, no significant valvular disease.  Calcium score on 07/24/2023 was 1195 (99th percentile).  She reports has been having chest pain.  Describes as chest tightness that occurs when she exerts herself, such as when she vacuums.  Has just started over the last month or so.  States that she will rest and it resolves.  She denies any dyspnea, lightheadedness, syncope, or lower extremity edema.  Reports rare palpitations.  No smoking history.  Family's includes mother had A-fib and father had AVR in his 25s.  LHC on 07/31/2023 showed 80% OM 2 stenosis (small caliber, not amenable to PCI), 40% mid LAD, 50% D1, 55% distal LCx, 15% proximal RCA.  Since last clinic visit, she reports she is doing okay.  She continues to have some chest pain with exertion.  Has not had to use nitroglycerin, states that chest pain will resolve within a few minutes when she rests.  She denies any dyspnea, lightheadedness, syncope, lower extremity edema.  Reports she will wake up at night with palpitations, last for short duration, feels like heart is racing.    Past Medical History:  Diagnosis Date   Allergy    Arthritis    knees and spine   Asthma    environmental allergies   Diabetes mellitus without complication (HCC)    GERD (gastroesophageal reflux disease)    Hiatal hernia    Hyperlipidemia     Hypertension    Low back pain radiating to both legs 2012   Dr. Electa Sniff   Lumbar pain with radiation down right leg    PONV (postoperative nausea and vomiting)     Past Surgical History:  Procedure Laterality Date   BACK SURGERY  1992   lumbar lam   CESAREAN SECTION  1986; 1989   CHOLECYSTECTOMY  2003   COLONOSCOPY     KNEE ARTHROSCOPY  2000   rt knee repair   LEFT HEART CATH AND CORONARY ANGIOGRAPHY N/A 07/31/2023   Procedure: LEFT HEART CATH AND CORONARY ANGIOGRAPHY;  Surgeon: Brianna Lex, MD;  Location: [facility] INVASIVE CV LAB;  Service: Cardiovascular;  Laterality: N/A;   POSTERIOR FUSION LUMBAR SPINE  11/02/11   right PLIF; L2-L5   TONSILLECTOMY  1970's   TUBAL LIGATION  1989    Current Medications: Current Meds  Medication Sig   aspirin EC 81 MG tablet Take 1 tablet (81 mg total) by mouth daily. Swallow whole.   atorvastatin (LIPITOR) 40 MG tablet Take 1 tablet (40 mg total) by mouth daily.   Bempedoic Acid-Ezetimibe (NEXLIZET) 180-10 MG TABS Take 1 tablet by mouth daily.   Cholecalciferol (VITAMIN D3) 1.25 MG (50000 UT) CAPS Take 50,000 Units by mouth once a week.   Continuous Glucose Receiver (FREESTYLE LIBRE 3 READER) DEVI 1 Act by Does not apply route daily.   Continuous Glucose  Sensor (FREESTYLE LIBRE 3 SENSOR) MISC 1 Act by Does not apply route daily. Place 1 sensor on the skin every 14 days. Use to check glucose continuously   Insulin Glargine-Lixisenatide (SOLIQUA) 100-33 UNT-MCG/ML SOPN Inject 20 Units into the skin daily.   Insulin Pen Needle 32G X 6 MM MISC 1 Act by Does not apply route once a week.   metFORMIN (GLUCOPHAGE-XR) 750 MG 24 hr tablet Take 1 tablet (750 mg total) by mouth daily with breakfast.   montelukast (SINGULAIR) 10 MG tablet TAKE 1 TABLET AT BEDTIME   nabumetone (RELAFEN) 500 MG tablet TAKE 1 TABLET TWICE A DAY AS NEEDED   nitroGLYCERIN (NITROSTAT) 0.4 MG SL tablet Place 1 tablet (0.4 mg total) under the tongue every 5 (five) minutes as  needed for chest pain. Do not exceed 3 doses in 15 minutes. If symptoms are not better with second dose, then call 911.   olmesartan (BENICAR) 40 MG tablet Take 1 tablet (40 mg total) by mouth daily.   [DISCONTINUED] amLODipine (NORVASC) 2.5 MG tablet Take 1 tablet (2.5 mg total) by mouth daily.   [DISCONTINUED] Cholecalciferol (VITAMIN D3) 1.25 MG (50000 UT) CAPS TAKE 1 CAPSULE ONCE WEEKLY     Allergies:   Crestor [rosuvastatin], Codeine, and Pravastatin   Social History   Socioeconomic History   Marital status: Widowed    Spouse name: Not on file   Number of children: Not on file   Years of education: Not on file   Highest education level: Associate degree: occupational, Scientist, product/process development, or vocational program  Occupational History   Not on file  Tobacco Use   Smoking status: Never   Smokeless tobacco: Never  Substance and Sexual Activity   Alcohol use: No    Alcohol/week: 0.0 standard drinks of alcohol   Drug use: No   Sexual activity: Never  Other Topics Concern   Not on file  Social History Narrative   Not on file   Social Determinants of Health   Financial Resource Strain: Low Risk  (06/25/2023)   Overall Financial Resource Strain (CARDIA)    Difficulty of Paying Living Expenses: Not very hard  Food Insecurity: No Food Insecurity (06/25/2023)   Hunger Vital Sign    Worried About Running Out of Food in the Last Year: Never true    Ran Out of Food in the Last Year: Never true  Transportation Needs: No Transportation Needs (06/25/2023)   PRAPARE - Administrator, Civil Service (Medical): No    Lack of Transportation (Non-Medical): No  Physical Activity: Insufficiently Active (06/25/2023)   Exercise Vital Sign    Days of Exercise per Week: 2 days    Minutes of Exercise per Session: 30 min  Stress: No Stress Concern Present (06/25/2023)   [employer] of Occupational Health - Occupational Stress Questionnaire    Feeling of Stress : Not at all  Social Connections:  Moderately Integrated (06/25/2023)   Social Connection and Isolation Panel [NHANES]    Frequency of Communication with Friends and Family: More than three times a week    Frequency of Social Gatherings with Friends and Family: More than three times a week    Attends Religious Services: More than 4 times per year    Active Member of [employer] or Organizations: Yes    Attends Banker Meetings: 1 to 4 times per year    Marital Status: Widowed     Family History: The patient's family history includes Diabetes in her father; Heart  disease in her father. There is no history of Cancer, Anesthesia problems, Hypotension, Malignant hyperthermia, Pseudochol deficiency, Colon cancer, Esophageal cancer, Stomach cancer, or Rectal cancer.  ROS:   Please see the history of present illness.     All other systems reviewed and are negative.  EKGs/Labs/Other Studies Reviewed:    The following studies were reviewed today:   EKG:   07/26/2023: Normal sinus rhythm, rate 63, no ST abnormalities  Recent Labs: 10/24/2022: ALT 14; TSH 0.52 07/27/2023: BUN 14; Creatinine, Ser 0.85; Hemoglobin 13.8; Platelets 306; Potassium 4.1; Sodium 138  Recent Lipid Panel    Component Value Date/Time   CHOL 261 (H) 06/25/2023 1611   TRIG 179.0 (H) 06/25/2023 1611   HDL 44.30 06/25/2023 1611   CHOLHDL 6 06/25/2023 1611   VLDL 35.8 06/25/2023 1611   LDLCALC 181 (H) 06/25/2023 1611   LDLDIRECT 155.6 07/11/2012 0911    Physical Exam:    VS:  BP (!) 148/80 (BP Location: Right Arm, Patient Position: Sitting, Cuff Size: Large)   Pulse (!) 57   Ht 5\' 6"  (1.676 m)   Wt 266 lb 12.8 oz (121 kg)   SpO2 96%   BMI 43.06 kg/m     Wt Readings from Last 3 Encounters:  08/07/23 266 lb 12.8 oz (121 kg)  07/31/23 267 lb (121.1 kg)  07/26/23 263 lb 9.6 oz (119.6 kg)     GEN:  Well nourished, well developed in no acute distress HEENT: Normal NECK: No JVD; No carotid bruits LYMPHATICS: No lymphadenopathy CARDIAC: RRR,  no murmurs, rubs, gallops RESPIRATORY:  Clear to auscultation without rales, wheezing or rhonchi  ABDOMEN: Soft, non-tender, non-distended MUSCULOSKELETAL:  No edema; No deformity  SKIN: Warm and dry NEUROLOGIC:  Alert and oriented x 3 PSYCHIATRIC:  Normal affect   ASSESSMENT:    1. Coronary artery disease of native artery of native heart with stable angina pectoris (HCC)   2. Palpitations   3. Hyperlipidemia, unspecified hyperlipidemia type   4. Essential hypertension, benign   5. Daytime somnolence     PLAN:    CAD: Calcium score on 07/24/2023 was 1195 (99th percentile).  Echocardiogram 07/18/2023 showed normal biventricular function, no significant valvular disease.  He reported exertional chest pain concerning for typical angina.  LHC on 07/31/2023 showed 80% OM 2 stenosis (small caliber, not amenable to PCI), 40% mid LAD, 50% D1, 55% distal LCx, 15% proximal RCA. -Continue ASA, statin -Sublingual nitroglycerin as needed -Medical management recommended for OM disease, started on amlodipine 2.5 mg daily.  Reports she continues to have exertional chest pain, will increase amlodipine to 5 mg daily.  Palpitations: Description concerning for arrhythmia, evaluate with Zio patch x 2 weeks  Hypertension: On olmesartan 40 mg daily and amlodipine 2.5 mg daily.  BP today, will increase amlodipine to 5 mg daily.  Asked to check blood pressure daily for next 2 weeks and let us know results  T2DM: On insulin.  A1c 7.8% on 06/25/2023  Hyperlipidemia: On atorvastatin 40 mg daily, LDL 181 on 06/25/2023.  Nexlizet 180-10 mg daily was added at that time.  Will follow-up lipid panel next month, suspect she will need PCSK9 inhibitor to get LDL to goal  Daytime somnolence: Concern for OSA, check [facility] sleep study.  STOP-BANG 4  RTC in 3 months   Medication Adjustments/Labs and Tests Ordered: Current medicines are reviewed at length with the patient today.  Concerns regarding medicines are outlined  above.  Orders Placed This Encounter  Procedures   Lipid  panel   LONG TERM MONITOR (3-14 DAYS)   [facility] Sleep Study   Meds ordered this encounter  Medications   amLODipine (NORVASC) 5 MG tablet    Sig: Take 1 tablet (5 mg total) by mouth daily.    Dispense:  90 tablet    Refill:  1    NEW DOSE, D/C 5 MG RX    Patient Instructions  Medication Instructions:  INCREASE YOUR AMLODIPINE TO 5 MG DAILY   *If you need a refill on your cardiac medications before your next appointment, please call your pharmacy*  Lab Work: FASTING LIPIDS 1 MONTH   If you have labs (blood work) drawn today and your tests are completely normal, you will receive your results only by: MyChart Message (if you have MyChart) OR A paper copy in the mail If you have any lab test that is abnormal or we need to change your treatment, we will call you to review the results.  Testing/Procedures: [facility] HOME SLEEP STUDY  THE OFFICE WILL CALL YOU WITH CODE ONCE INSURANCE HAS APPROVED   14 DAY ZIO MONITOR   Follow-Up: At [facility], you and your health needs are our priority.  As part of our continuing mission to provide you with exceptional heart care, we have created designated Provider Care Teams.  These Care Teams include your primary Cardiologist (physician) and Advanced Practice Providers (APPs -  Physician Assistants and Nurse Practitioners) who all work together to provide you with the care you need, when you need it.  We recommend signing up for the patient portal called "MyChart".  Sign up information is provided on this After Visit Summary.  MyChart is used to connect with patients for Virtual Visits (Telemedicine).  Patients are able to view lab/test results, encounter notes, upcoming appointments, etc.  Non-urgent messages can be sent to your provider as well.   To learn more about what you can do with MyChart, go to [email or web address].    Your next appointment:   3 month(s)  Provider:    Epifanio Lesches, MD    Other Instructions MONITOR AN LOG YOUR BLOOD PRESSURE DAILY FOR 2 WEEKS. CALL THE OFFICE OR SEND MYCHART MESSAGE WITH YOUR READINGS   ZIO XT- Long Term Monitor Instructions  Your physician has requested you wear a ZIO patch monitor for 14 days.  This is a single patch monitor. [employer] supplies one patch monitor per enrollment. Additional stickers are not available. Please do not apply patch if you will be having a Nuclear Stress Test,  Echocardiogram, Cardiac CT, MRI, or Chest Xray during the period you would be wearing the  monitor. The patch cannot be worn during these tests. You cannot remove and re-apply the  ZIO XT patch monitor.  Your ZIO patch monitor will be mailed 3 day USPS to your address on file. It may take 3-5 days  to receive your monitor after you have been enrolled.  Once you have received your monitor, please review the enclosed instructions. Your monitor  has already been registered assigning a specific monitor serial # to you.  Billing and Patient Assistance Program Information  We have supplied [employer] with any of your insurance information on file for billing purposes. [employer] offers a sliding scale Patient Assistance Program for patients that do not have  insurance, or whose insurance does not completely cover the cost of the ZIO monitor.  You must apply for the Patient Assistance Program to qualify for this discounted rate.  To apply, please call  [employer] at [phone], select option 4, select option 2, ask to apply for  Patient Assistance Program. Meredeth Ide will ask your household income, and how many people  are in your household. They will quote your out-of-pocket cost based on that information.  [employer] will also be able to set up a 55-month, interest-free payment plan if needed.  Applying the monitor   Shave hair from upper left chest.  Hold abrader disc by orange tab. Rub abrader in 40 strokes over the upper left chest as   indicated in your monitor instructions.  Clean area with 4 enclosed alcohol pads. Let dry.  Apply patch as indicated in monitor instructions. Patch will be placed under collarbone on left  side of chest with arrow pointing upward.  Rub patch adhesive wings for 2 minutes. Remove white label marked "1". Remove the white  label marked "2". Rub patch adhesive wings for 2 additional minutes.  While looking in a mirror, press and release button in center of patch. A small green light will  flash 3-4 times. This will be your only indicator that the monitor has been turned on.  Do not shower for the first 24 hours. You may shower after the first 24 hours.  Press the button if you feel a symptom. You will hear a small click. Record Date, Time and  Symptom in the Patient Logbook.  When you are ready to remove the patch, follow instructions on the last 2 pages of Patient  Logbook. Stick patch monitor onto the last page of Patient Logbook.  Place Patient Logbook in the blue and white box. Use locking tab on box and tape box closed  securely. The blue and white box has prepaid postage on it. Please place it in the mailbox as  soon as possible. Your physician should have your test results approximately 7 days after the  monitor has been mailed back to [facility].  Call [facility] Customer Care at [phone] if you have questions regarding  your ZIO XT patch monitor. Call them immediately if you see an orange light blinking on your  monitor.  If your monitor falls off in less than 4 days, contact our Monitor department at [phone].  If your monitor becomes loose or falls off after 4 days call [employer] at [phone] for  suggestions on securing your monitor  WatchPAT?  Is a FDA cleared portable home sleep study test that uses a watch and 3 points of contact to monitor 7 different channels, including your heart rate, oxygen saturations, body position, snoring, and chest motion.  The study  is easy to use from the comfort of your own home and accurately detect sleep apnea.  Before bed, you attach the chest sensor, attached the sleep apnea bracelet to your nondominant hand, and attach the finger probe.  After the study, the raw data is downloaded from the watch and scored for apnea events.   For more information: [email or web address]  Patient Testing Instructions:  Do not put battery into the device until bedtime when you are ready to begin the test. Please call the support number if you need assistance after following the instructions below: 24 hour support line- [phone] or [facility] support at [phone] (option 2)  Download the IntelWatchPAT One" app through the google play store or App Store  Be sure to turn on or enable access to bluetooth in settlings on your smartphone/ device  Make sure no other bluetooth devices are on and within the vicinity of your smartphone/ device  and WatchPAT watch during testing.  Make sure to leave your smart phone/ device plugged in and charging all night.  When ready for bed:  Follow the instructions step by step in the WatchPAT One App to activate the testing device. For additional instructions, including video instruction, visit the WatchPAT One video on Youtube. You can search for WatchPat One within Youtube (video is 4 minutes and 18 seconds) or enter: https://youtube/watch?[email or web address]_vbiwxE Please note: You will be prompted to enter a Pin to connect via bluetooth when starting the test. The PIN will be assigned to you when you receive the test.  The device is disposable, but it recommended that you retain the device until you receive a call letting you know the study has been received and the results have been interpreted.  We will let you know if the study did not transmit to [place] properly after the test is completed. You do not need to call us to confirm the receipt of the test.  Please complete the test within 48 hours of  receiving PIN.   Frequently Asked Questions:  What is Watch Dennie Bible one?  A single use fully disposable home sleep apnea testing device and will not need to be returned after completion.  What are the requirements to use WatchPAT one?  The be able to have a successful watchpat one sleep study, you should have your Watch pat one device, your smart phone, watch pat one app, your PIN number and Internet access What type of phone do I need?  You should have a smart phone that uses Android 5.1 and above or any Iphone with IOS 10 and above How can I download the WatchPAT one app?  Based on your device type search for WatchPAT one app either in google play for android devices or APP store for Iphone's Where will I get my PIN for the study?  Your PIN will be provided by your physician's office. It is used for authentication and if you lose/forget your PIN, please reach out to your providers office.  I do not have Internet at home. Can I do WatchPAT one study?  WatchPAT One needs Internet connection throughout the night to be able to transmit the sleep data. You can use your home/local internet or your cellular's data package. However, it is always recommended to use home/local Internet. It is estimated that between 20MB-30MB will be used with each study.However, the application will be looking for space in the phone to start the study.  What happens if I lose internet or bluetooth connection?  During the internet disconnection, your phone will not be able to transmit the sleep data. All the data, will be stored in your phone. As soon as the internet connection is back on, the phone will being sending the sleep data. During the bluetooth disconnection, WatchPAT one will not be able to to send the sleep data to your phone. Data will be kept in the [facility] one until two devices have bluetooth connection back on. As soon as the connection is back on, WatchPAT one will send the sleep data to the phone.  How  long do I need to wear the WatchPAT one?  After you start the study, you should wear the device at least 6 hours.  How far should I keep my phone from the device?  During the night, your phone should be within 15 feet.  What happens if I leave the room for restroom or other reasons?  Leaving the room for  any reason will not cause any problem. As soon as your get back to the room, both devices will reconnect and will continue to send the sleep data. Can I use my phone during the sleep study?  Yes, you can use your phone as usual during the study. But it is recommended to put your watchpat one on when you are ready to go to bed.  How will I get my study results?  A soon as you completed your study, your sleep data will be sent to the provider. They will then share the results with you when they are ready.     Signed, Little Ishikawa, MD  08/07/2023 12:46 PM    [facility]

## 2023-08-07 ENCOUNTER — Ambulatory Visit (HOSPITAL_BASED_OUTPATIENT_CLINIC_OR_DEPARTMENT_OTHER): Payer: Managed Care, Other (non HMO) | Admitting: Cardiology

## 2023-08-07 ENCOUNTER — Other Ambulatory Visit (HOSPITAL_BASED_OUTPATIENT_CLINIC_OR_DEPARTMENT_OTHER): Payer: Managed Care, Other (non HMO)

## 2023-08-07 ENCOUNTER — Encounter (HOSPITAL_BASED_OUTPATIENT_CLINIC_OR_DEPARTMENT_OTHER): Payer: Self-pay | Admitting: Cardiology

## 2023-08-07 VITALS — BP 148/80 | HR 57 | Ht 66.0 in | Wt 266.8 lb

## 2023-08-07 DIAGNOSIS — E785 Hyperlipidemia, unspecified: Secondary | ICD-10-CM | POA: Diagnosis not present

## 2023-08-07 DIAGNOSIS — I10 Essential (primary) hypertension: Secondary | ICD-10-CM

## 2023-08-07 DIAGNOSIS — R400 Somnolence: Secondary | ICD-10-CM

## 2023-08-07 DIAGNOSIS — R002 Palpitations: Secondary | ICD-10-CM

## 2023-08-07 DIAGNOSIS — I25118 Atherosclerotic heart disease of native coronary artery with other forms of angina pectoris: Secondary | ICD-10-CM | POA: Diagnosis not present

## 2023-08-07 MED ORDER — AMLODIPINE BESYLATE 5 MG PO TABS: 5.0000 mg | ORAL_TABLET | Freq: Every day | ORAL | 1 refills | Status: DC

## 2023-08-07 NOTE — Progress Notes (Signed)
Patient agreement reviewed and signed on 08/07/2023.  WatchPAT issued to patient on 08/07/2023 by Regis Bill, LPN. Patient aware to not open the WatchPAT box until contacted with the activation PIN. Patient profile initialized in CloudPAT on 08/07/2023 by Brunetta Genera, CMA. Device serial number: [id number]

## 2023-08-07 NOTE — Patient Instructions (Signed)
Medication Instructions:  INCREASE YOUR AMLODIPINE TO 5 MG DAILY   *If you need a refill on your cardiac medications before your next appointment, please call your pharmacy*  Lab Work: FASTING LIPIDS 1 MONTH   If you have labs (blood work) drawn today and your tests are completely normal, you will receive your results only by: MyChart Message (if you have MyChart) OR A paper copy in the mail If you have any lab test that is abnormal or we need to change your treatment, we will call you to review the results.  Testing/Procedures: [facility] HOME SLEEP STUDY  THE OFFICE WILL CALL YOU WITH CODE ONCE INSURANCE HAS APPROVED   14 DAY ZIO MONITOR   Follow-Up: At [facility], you and your health needs are our priority.  As part of our continuing mission to provide you with exceptional heart care, we have created designated Provider Care Teams.  These Care Teams include your primary Cardiologist (physician) and Advanced Practice Providers (APPs -  Physician Assistants and Nurse Practitioners) who all work together to provide you with the care you need, when you need it.  We recommend signing up for the patient portal called "MyChart".  Sign up information is provided on this After Visit Summary.  MyChart is used to connect with patients for Virtual Visits (Telemedicine).  Patients are able to view lab/test results, encounter notes, upcoming appointments, etc.  Non-urgent messages can be sent to your provider as well.   To learn more about what you can do with MyChart, go to [email or web address].    Your next appointment:   3 month(s)  Provider:   Epifanio Lesches, MD    Other Instructions MONITOR AN LOG YOUR BLOOD PRESSURE DAILY FOR 2 WEEKS. CALL THE OFFICE OR SEND MYCHART MESSAGE WITH YOUR READINGS   ZIO XT- Long Term Monitor Instructions  Your physician has requested you wear a ZIO patch monitor for 14 days.  This is a single patch monitor. [employer] supplies one patch  monitor per enrollment. Additional stickers are not available. Please do not apply patch if you will be having a Nuclear Stress Test,  Echocardiogram, Cardiac CT, MRI, or Chest Xray during the period you would be wearing the  monitor. The patch cannot be worn during these tests. You cannot remove and re-apply the  ZIO XT patch monitor.  Your ZIO patch monitor will be mailed 3 day USPS to your address on file. It may take 3-5 days  to receive your monitor after you have been enrolled.  Once you have received your monitor, please review the enclosed instructions. Your monitor  has already been registered assigning a specific monitor serial # to you.  Billing and Patient Assistance Program Information  We have supplied [employer] with any of your insurance information on file for billing purposes. [employer] offers a sliding scale Patient Assistance Program for patients that do not have  insurance, or whose insurance does not completely cover the cost of the ZIO monitor.  You must apply for the Patient Assistance Program to qualify for this discounted rate.  To apply, please call [employer] at [phone], select option 4, select option 2, ask to apply for  Patient Assistance Program. Meredeth Ide will ask your household income, and how many people  are in your household. They will quote your out-of-pocket cost based on that information.  [employer] will also be able to set up a 18-month, interest-free payment plan if needed.  Applying the monitor   Shave hair from upper left  chest.  Hold abrader disc by orange tab. Rub abrader in 40 strokes over the upper left chest as  indicated in your monitor instructions.  Clean area with 4 enclosed alcohol pads. Let dry.  Apply patch as indicated in monitor instructions. Patch will be placed under collarbone on left  side of chest with arrow pointing upward.  Rub patch adhesive wings for 2 minutes. Remove white label marked "1". Remove the white  label marked "2".  Rub patch adhesive wings for 2 additional minutes.  While looking in a mirror, press and release button in center of patch. A small green light will  flash 3-4 times. This will be your only indicator that the monitor has been turned on.  Do not shower for the first 24 hours. You may shower after the first 24 hours.  Press the button if you feel a symptom. You will hear a small click. Record Date, Time and  Symptom in the Patient Logbook.  When you are ready to remove the patch, follow instructions on the last 2 pages of Patient  Logbook. Stick patch monitor onto the last page of Patient Logbook.  Place Patient Logbook in the blue and white box. Use locking tab on box and tape box closed  securely. The blue and white box has prepaid postage on it. Please place it in the mailbox as  soon as possible. Your physician should have your test results approximately 7 days after the  monitor has been mailed back to [facility].  Call [facility] Customer Care at [phone] if you have questions regarding  your ZIO XT patch monitor. Call them immediately if you see an orange light blinking on your  monitor.  If your monitor falls off in less than 4 days, contact our Monitor department at [phone].  If your monitor becomes loose or falls off after 4 days call [employer] at [phone] for  suggestions on securing your monitor  WatchPAT?  Is a FDA cleared portable home sleep study test that uses a watch and 3 points of contact to monitor 7 different channels, including your heart rate, oxygen saturations, body position, snoring, and chest motion.  The study is easy to use from the comfort of your own home and accurately detect sleep apnea.  Before bed, you attach the chest sensor, attached the sleep apnea bracelet to your nondominant hand, and attach the finger probe.  After the study, the raw data is downloaded from the watch and scored for apnea events.   For more information:  [email or web address]  Patient Testing Instructions:  Do not put battery into the device until bedtime when you are ready to begin the test. Please call the support number if you need assistance after following the instructions below: 24 hour support line- [phone] or ITAMAR support at [phone] (option 2)  Download the IntelWatchPAT One" app through the google play store or App Store  Be sure to turn on or enable access to bluetooth in settlings on your smartphone/ device  Make sure no other bluetooth devices are on and within the vicinity of your smartphone/ device and WatchPAT watch during testing.  Make sure to leave your smart phone/ device plugged in and charging all night.  When ready for bed:  Follow the instructions step by step in the WatchPAT One App to activate the testing device. For additional instructions, including video instruction, visit the WatchPAT One video on Youtube. You can search for WatchPat One within Youtube (video is 4 minutes and 18  seconds) or enter: https://youtube/watch?[email or web address]_vbiwxE Please note: You will be prompted to enter a Pin to connect via bluetooth when starting the test. The PIN will be assigned to you when you receive the test.  The device is disposable, but it recommended that you retain the device until you receive a call letting you know the study has been received and the results have been interpreted.  We will let you know if the study did not transmit to [place] properly after the test is completed. You do not need to call us to confirm the receipt of the test.  Please complete the test within 48 hours of receiving PIN.   Frequently Asked Questions:  What is Watch Dennie Bible one?  A single use fully disposable home sleep apnea testing device and will not need to be returned after completion.  What are the requirements to use WatchPAT one?  The be able to have a successful watchpat one sleep study, you should have your Watch pat one  device, your smart phone, watch pat one app, your PIN number and Internet access What type of phone do I need?  You should have a smart phone that uses Android 5.1 and above or any Iphone with IOS 10 and above How can I download the WatchPAT one app?  Based on your device type search for WatchPAT one app either in google play for android devices or APP store for Iphone's Where will I get my PIN for the study?  Your PIN will be provided by your physician's office. It is used for authentication and if you lose/forget your PIN, please reach out to your providers office.  I do not have Internet at home. Can I do WatchPAT one study?  WatchPAT One needs Internet connection throughout the night to be able to transmit the sleep data. You can use your home/local internet or your cellular's data package. However, it is always recommended to use home/local Internet. It is estimated that between 20MB-30MB will be used with each study.However, the application will be looking for space in the phone to start the study.  What happens if I lose internet or bluetooth connection?  During the internet disconnection, your phone will not be able to transmit the sleep data. All the data, will be stored in your phone. As soon as the internet connection is back on, the phone will being sending the sleep data. During the bluetooth disconnection, WatchPAT one will not be able to to send the sleep data to your phone. Data will be kept in the [facility] one until two devices have bluetooth connection back on. As soon as the connection is back on, WatchPAT one will send the sleep data to the phone.  How long do I need to wear the WatchPAT one?  After you start the study, you should wear the device at least 6 hours.  How far should I keep my phone from the device?  During the night, your phone should be within 15 feet.  What happens if I leave the room for restroom or other reasons?  Leaving the room for any reason will not  cause any problem. As soon as your get back to the room, both devices will reconnect and will continue to send the sleep data. Can I use my phone during the sleep study?  Yes, you can use your phone as usual during the study. But it is recommended to put your watchpat one on when you are ready to go to bed.  How  will I get my study results?  A soon as you completed your study, your sleep data will be sent to the provider. They will then share the results with you when they are ready.

## 2023-08-15 ENCOUNTER — Other Ambulatory Visit: Payer: Self-pay | Admitting: Cardiology

## 2023-09-22 LAB — LIPID PANEL
Chol/HDL Ratio: 3.6 {ratio} (ref 0.0–4.4)
Cholesterol, Total: 165 mg/dL (ref 100–199)
HDL: 46 mg/dL (ref >39)
LDL Chol Calc (NIH): 97 mg/dL (ref 0–99)
Triglycerides: 122 mg/dL (ref 0–149)
VLDL Cholesterol Cal: 22 mg/dL (ref 5–40)

## 2023-09-24 ENCOUNTER — Other Ambulatory Visit: Payer: Self-pay | Admitting: Internal Medicine

## 2023-09-24 DIAGNOSIS — J45998 Other asthma: Secondary | ICD-10-CM

## 2023-09-27 ENCOUNTER — Other Ambulatory Visit: Payer: Self-pay

## 2023-09-27 DIAGNOSIS — E785 Hyperlipidemia, unspecified: Secondary | ICD-10-CM

## 2023-10-03 ENCOUNTER — Other Ambulatory Visit: Payer: Self-pay

## 2023-10-03 ENCOUNTER — Telehealth: Payer: Self-pay

## 2023-10-03 MED ORDER — METOPROLOL TARTRATE 25 MG PO TABS: ORAL_TABLET | ORAL | 3 refills | Status: DC

## 2023-10-03 NOTE — Telephone Encounter (Signed)
Spoke to patient she stated she was given a Itamar sleep watch at her last office visit with Dr.Schumann.Stated she has never received a pin #.Advised I will send message to our sleep pool.

## 2023-10-16 NOTE — Telephone Encounter (Signed)
**Note De-Identified Stephonie Wilcoxen Obfuscation** Ordering provider: Dr Bjorn Pippin Associated diagnoses: OSA-G47.33, Snoring-R06.83 and Somnolence-R40.0 WatchPAT PA obtained on 10/16/2023 by Jaskaran Dauzat, Lorelle Formosa, LPN. Authorization: Per the Water quality scientist at [place], No PA is required for CPT Code: 75643 Patient notified of PIN (1234) on 10/16/2023 Locklan Canoy Notification Method: MyChart message. I also called the pts home and cell phone but got no answer so I left a message on both VMs (ok per DPR) advising her that the Pin # for the WatchPat One-Home Sleep Study device is "1234" and that she may proceed with her sleep test now.  I did leave my name and office number so she can call back if she has any questions or concerns.  Phone note routed to covering staff for follow-up.

## 2023-10-22 ENCOUNTER — Other Ambulatory Visit: Payer: Self-pay | Admitting: Internal Medicine

## 2023-10-22 DIAGNOSIS — E119 Type 2 diabetes mellitus without complications: Secondary | ICD-10-CM

## 2023-10-22 NOTE — Telephone Encounter (Signed)
Spoke to patient and patient verbalized she received instructions for Itamar and had no other questions. Advised patient to call office if there were any concerns. Patient voiced an understanding.

## 2023-11-26 ENCOUNTER — Ambulatory Visit: Payer: Managed Care, Other (non HMO) | Admitting: Cardiology

## 2023-11-28 ENCOUNTER — Other Ambulatory Visit: Payer: Self-pay | Admitting: Internal Medicine

## 2023-11-28 DIAGNOSIS — Z1231 Encounter for screening mammogram for malignant neoplasm of breast: Secondary | ICD-10-CM

## 2023-12-05 ENCOUNTER — Other Ambulatory Visit: Payer: Self-pay | Admitting: Internal Medicine

## 2023-12-05 DIAGNOSIS — E118 Type 2 diabetes mellitus with unspecified complications: Secondary | ICD-10-CM

## 2023-12-05 DIAGNOSIS — I10 Essential (primary) hypertension: Secondary | ICD-10-CM

## 2023-12-05 DIAGNOSIS — E785 Hyperlipidemia, unspecified: Secondary | ICD-10-CM

## 2023-12-16 NOTE — Progress Notes (Unsigned)
Cardiology Office Note:    Date:  12/17/2023   ID:  Devan, Babino 1956-06-17, MRN [id number]  PCP:  Etta Grandchild, MD  Cardiologist:  None  Electrophysiologist:  None   Referring MD: Etta Grandchild, MD   Chief Complaint  Patient presents with   Follow-up    3 months.   Coronary Artery Disease    History of Present Illness:    Brianna Lambert is a 68 y.o. female with a hx of CAD, T2DM, hypertension, hyperlipidemia, GERD who presents for follow-up.  She was referred by Dr. Yetta Barre for evaluation of CAD, initially seen on 07/26/2023.  Echocardiogram 07/18/2023 showed normal biventricular function, no significant valvular disease.  Calcium score on 07/24/2023 was 1195 (99th percentile).  She reports has been having chest pain.  Describes as chest tightness that occurs when she exerts herself, such as when she vacuums.  Has just started over the last month or so.  States that she will rest and it resolves.  She denies any dyspnea, lightheadedness, syncope, or lower extremity edema.  Reports rare palpitations.  No smoking history.  Family's includes mother had A-fib and father had AVR in his 68s.  LHC on 07/31/2023 showed 80% OM 2 stenosis (small caliber, not amenable to PCI), 40% mid LAD, 50% D1, 55% distal LCx, 15% proximal RCA.  Zio patch x 14 days 07/2023 showed 816 episodes of SVT with longest lasting 54 seconds, frequent PACs (7.5%), 1 episode of NSVT lasting 4 beats.  Since last clinic visit, she reports she is doing okay.  She denies any chest pain.  Reports palpitations have resolved.  Denies any dyspnea, lightheadedness, syncope, or lower extremity edema.  Does report she has been having joint pains since she has been on atorvastatin.  Past Medical History:  Diagnosis Date   Allergy    Arthritis    knees and spine   Asthma    environmental allergies   Diabetes mellitus without complication (HCC)    GERD (gastroesophageal reflux disease)    Hiatal hernia     Hyperlipidemia    Hypertension    Low back pain radiating to both legs 2012   Dr. Electa Sniff   Lumbar pain with radiation down right leg    PONV (postoperative nausea and vomiting)     Past Surgical History:  Procedure Laterality Date   BACK SURGERY  1992   lumbar lam   CESAREAN SECTION  1986; 1989   CHOLECYSTECTOMY  2003   COLONOSCOPY     KNEE ARTHROSCOPY  2000   rt knee repair   LEFT HEART CATH AND CORONARY ANGIOGRAPHY N/A 07/31/2023   Procedure: LEFT HEART CATH AND CORONARY ANGIOGRAPHY;  Surgeon: Marykay Lex, MD;  [facility] INVASIVE CV LAB;  Service: Cardiovascular;  Laterality: N/A;   POSTERIOR FUSION LUMBAR SPINE  11/02/11   right PLIF; L2-L5   TONSILLECTOMY  1970's   TUBAL LIGATION  1989    Current Medications: Current Meds  Medication Sig   amLODipine (NORVASC) 5 MG tablet Take 1 tablet (5 mg total) by mouth daily.   aspirin EC 81 MG tablet Take 1 tablet (81 mg total) by mouth daily. Swallow whole.   Bempedoic Acid-Ezetimibe (NEXLIZET) 180-10 MG TABS Take 1 tablet by mouth daily.   Cholecalciferol (VITAMIN D3) 1.25 MG (50000 UT) CAPS Take 50,000 Units by mouth once a week.   Continuous Glucose Receiver (FREESTYLE LIBRE 3 READER) DEVI 1 Act by Does not apply route daily.   Continuous  Glucose Sensor (FREESTYLE LIBRE 3 SENSOR) MISC 1 Act by Does not apply route daily. Place 1 sensor on the skin every 14 days. Use to check glucose continuously   Insulin Pen Needle 32G X 6 MM MISC 1 Act by Does not apply route once a week.   metFORMIN (GLUCOPHAGE-XR) 750 MG 24 hr tablet Take 1 tablet (750 mg total) by mouth daily with breakfast.   metoprolol succinate (TOPROL XL) 25 MG 24 hr tablet Take 1 tablet (25 mg total) by mouth daily.   montelukast (SINGULAIR) 10 MG tablet TAKE 1 TABLET AT BEDTIME   nabumetone (RELAFEN) 500 MG tablet TAKE 1 TABLET TWICE A DAY AS NEEDED   nitroGLYCERIN (NITROSTAT) 0.4 MG SL tablet PLACE 1 TABLET UNDER THE TONGUE EVERY 5 MINS AS NEEDED FOR CHEST  PAIN X 3 DOSES MAX IF SYMPTONS NOT RELIEVED THEN CALL 911   olmesartan (BENICAR) 40 MG tablet TAKE 1 TABLET DAILY   SOLIQUA 100-33 UNT-MCG/ML SOPN INJECT 20 UNITS UNDER THE SKIN DAILY   [DISCONTINUED] atorvastatin (LIPITOR) 40 MG tablet TAKE 1 TABLET DAILY (Patient taking differently: Take 80 mg by mouth daily.)   [DISCONTINUED] metoprolol tartrate (LOPRESSOR) 25 MG tablet Take 1/2 tablet ( 12.5 mg ) twice a day     Allergies:   Crestor [rosuvastatin], Codeine, and Pravastatin   Social History   Socioeconomic History   Marital status: Widowed    Spouse name: Not on file   Number of children: Not on file   Years of education: Not on file   Highest education level: Associate degree: occupational, Scientist, product/process development, or vocational program  Occupational History   Not on file  Tobacco Use   Smoking status: Never   Smokeless tobacco: Never  Substance and Sexual Activity   Alcohol use: No    Alcohol/week: 0.0 standard drinks of alcohol   Drug use: No   Sexual activity: Never  Other Topics Concern   Not on file  Social History Narrative   Not on file   Social Drivers of Health   Financial Resource Strain: Low Risk  (06/25/2023)   Overall Financial Resource Strain (CARDIA)    Difficulty of Paying Living Expenses: Not very hard  Food Insecurity: No Food Insecurity (06/25/2023)   Hunger Vital Sign    Worried About Running Out of Food in the Last Year: Never true    Ran Out of Food in the Last Year: Never true  Transportation Needs: No Transportation Needs (06/25/2023)   PRAPARE - Administrator, Civil Service (Medical): No    Lack of Transportation (Non-Medical): No  Physical Activity: Insufficiently Active (06/25/2023)   Exercise Vital Sign    Days of Exercise per Week: 2 days    Minutes of Exercise per Session: 30 min  Stress: No Stress Concern Present (06/25/2023)   [employer] of Occupational Health - Occupational Stress Questionnaire    Feeling of Stress : Not at all   Social Connections: Moderately Integrated (06/25/2023)   Social Connection and Isolation Panel [NHANES]    Frequency of Communication with Friends and Family: More than three times a week    Frequency of Social Gatherings with Friends and Family: More than three times a week    Attends Religious Services: More than 4 times per year    Active Member of [employer] or Organizations: Yes    Attends Banker Meetings: 1 to 4 times per year    Marital Status: Widowed     Family History: The  patient's family history includes Diabetes in her father; Heart disease in her father. There is no history of Cancer, Anesthesia problems, Hypotension, Malignant hyperthermia, Pseudochol deficiency, Colon cancer, Esophageal cancer, Stomach cancer, or Rectal cancer.  ROS:   Please see the history of present illness.     All other systems reviewed and are negative.  EKGs/Labs/Other Studies Reviewed:    The following studies were reviewed today:   EKG:   07/26/2023: Normal sinus rhythm, rate 63, no ST abnormalities 12/17/2023: Sinus bradycardia, rate 59  Recent Labs: 07/27/2023: BUN 14; Creatinine, Ser 0.85; Hemoglobin 13.8; Platelets 306; Potassium 4.1; Sodium 138  Recent Lipid Panel    Component Value Date/Time   CHOL 165 09/21/2023 0817   TRIG 122 09/21/2023 0817   HDL 46 09/21/2023 0817   CHOLHDL 3.6 09/21/2023 0817   CHOLHDL 6 06/25/2023 1611   VLDL 35.8 06/25/2023 1611   LDLCALC 97 09/21/2023 0817   LDLDIRECT 155.6 07/11/2012 0911    Physical Exam:    VS:  BP 114/66 (BP Location: Left Arm, Patient Position: Sitting, Cuff Size: Large)   Pulse (!) 59   Ht 5\' 6"  (1.676 m)   Wt 258 lb (117 kg)   BMI 41.64 kg/m     Wt Readings from Last 3 Encounters:  12/17/23 258 lb (117 kg)  08/07/23 266 lb 12.8 oz (121 kg)  07/31/23 267 lb (121.1 kg)     GEN:  Well nourished, well developed in no acute distress HEENT: Normal NECK: No JVD; No carotid bruits LYMPHATICS: No  lymphadenopathy CARDIAC: RRR, no murmurs, rubs, gallops RESPIRATORY:  Clear to auscultation without rales, wheezing or rhonchi  ABDOMEN: Soft, non-tender, non-distended MUSCULOSKELETAL:  No edema; No deformity  SKIN: Warm and dry NEUROLOGIC:  Alert and oriented x 3 PSYCHIATRIC:  Normal affect   ASSESSMENT:    1. Coronary artery disease of native artery of native heart with stable angina pectoris (HCC)   2. Essential hypertension, benign   3. Palpitations   4. Daytime somnolence   5. Hyperlipidemia, unspecified hyperlipidemia type      PLAN:    CAD: Calcium score on 07/24/2023 was 1195 (99th percentile).  Echocardiogram 07/18/2023 showed normal biventricular function, no significant valvular disease.  He reported exertional chest pain concerning for typical angina.  LHC on 07/31/2023 showed 80% OM 2 stenosis (small caliber, not amenable to PCI), 40% mid LAD, 50% D1, 55% distal LCx, 15% proximal RCA. -Continue ASA, statin -Sublingual nitroglycerin as needed -Medical management recommended for OM disease, continue amlodipine 5 mg daily metoprolol 12.5 mg twice daily  SVT: Zio patch x 14 days 07/2023 showed 816 episodes of SVT with longest lasting 54 seconds, frequent PACs (7.5%), 1 episode of NSVT lasting 4 beats. -Started metoprolol 12.5 mg twice daily, reports significant improvement in palpitations.  Will consolidate to Toprol-XL 25 mg daily  Hypertension: On olmesartan 40 mg daily and amlodipine 5 mg daily.  Appears controlled  T2DM: On insulin.  A1c 7.8% on 06/25/2023  Hyperlipidemia: On atorvastatin 40 mg daily, LDL 181 on 06/25/2023.  Nexlizet 180-10 mg daily was added at that time.  LDL 97 on 09/21/2023.  Atorvastatin increased to 80 mg daily. -She reports significant joint pains on atorvastatin, would like to discontinue.  Will discontinue statin and refer to pharmacy lipid clinic to evaluate for PCSK9 inhibitor  Daytime somnolence: Concern for OSA, check Itamar sleep study.   STOP-BANG 4  RTC in 6 months   Medication Adjustments/Labs and Tests Ordered: Current medicines are reviewed  at length with the patient today.  Concerns regarding medicines are outlined above.  Orders Placed This Encounter  Procedures   AMB Referral to [facility] Pharm-D   EKG 12-Lead   Meds ordered this encounter  Medications   metoprolol succinate (TOPROL XL) 25 MG 24 hr tablet    Sig: Take 1 tablet (25 mg total) by mouth daily.    Dispense:  90 tablet    Refill:  3    Patient Instructions  Medication Instructions:  Stop Metoprolol  25 mg daily Stop Atorvastatin 40 mg daily Start Toprol XL 25 mg daily *If you need a refill on your cardiac medications before your next appointment, please call your pharmacy*   Lab Work: none If you have labs (blood work) drawn today and your tests are completely normal, you will receive your results only by: MyChart Message (if you have MyChart) OR A paper copy in the mail If you have any lab test that is abnormal or we need to change your treatment, we will call you to review the results.   Testing/Procedures: none   Follow-Up: At [facility], you and your health needs are our priority.  As part of our continuing mission to provide you with exceptional heart care, we have created designated Provider Care Teams.  These Care Teams include your primary Cardiologist (physician) and Advanced Practice Providers (APPs -  Physician Assistants and Nurse Practitioners) who all work together to provide you with the care you need, when you need it.  We recommend signing up for the patient portal called "MyChart".  Sign up information is provided on this After Visit Summary.  MyChart is used to connect with patients for Virtual Visits (Telemedicine).  Patients are able to view lab/test results, encounter notes, upcoming appointments, etc.  Non-urgent messages can be sent to your provider as well.   To learn more about what you can do with  MyChart, go to [email or web address].    Your next appointment:   6 month(s)  Provider:   Dr. Bjorn Pippin   Other Instructions Referral to Pharm D - LIPIDS         Signed, Little Ishikawa, MD  12/17/2023 5:31 PM    [facility]

## 2023-12-17 ENCOUNTER — Encounter: Payer: Self-pay | Admitting: Cardiology

## 2023-12-17 ENCOUNTER — Ambulatory Visit: Payer: Managed Care, Other (non HMO) | Attending: Cardiology | Admitting: Cardiology

## 2023-12-17 VITALS — BP 114/66 | HR 59 | Ht 66.0 in | Wt 258.0 lb

## 2023-12-17 DIAGNOSIS — R002 Palpitations: Secondary | ICD-10-CM

## 2023-12-17 DIAGNOSIS — R400 Somnolence: Secondary | ICD-10-CM | POA: Diagnosis not present

## 2023-12-17 DIAGNOSIS — I10 Essential (primary) hypertension: Secondary | ICD-10-CM | POA: Diagnosis not present

## 2023-12-17 DIAGNOSIS — I25118 Atherosclerotic heart disease of native coronary artery with other forms of angina pectoris: Secondary | ICD-10-CM | POA: Diagnosis not present

## 2023-12-17 DIAGNOSIS — E785 Hyperlipidemia, unspecified: Secondary | ICD-10-CM

## 2023-12-17 MED ORDER — METOPROLOL SUCCINATE ER 25 MG PO TB24: 25.0000 mg | ORAL_TABLET | Freq: Every day | ORAL | 3 refills | Status: DC

## 2023-12-17 NOTE — Patient Instructions (Signed)
Medication Instructions:  Stop Metoprolol  25 mg daily Stop Atorvastatin 40 mg daily Start Toprol XL 25 mg daily *If you need a refill on your cardiac medications before your next appointment, please call your pharmacy*   Lab Work: none If you have labs (blood work) drawn today and your tests are completely normal, you will receive your results only by: MyChart Message (if you have MyChart) OR A paper copy in the mail If you have any lab test that is abnormal or we need to change your treatment, we will call you to review the results.   Testing/Procedures: none   Follow-Up: At [facility], you and your health needs are our priority.  As part of our continuing mission to provide you with exceptional heart care, we have created designated Provider Care Teams.  These Care Teams include your primary Cardiologist (physician) and Advanced Practice Providers (APPs -  Physician Assistants and Nurse Practitioners) who all work together to provide you with the care you need, when you need it.  We recommend signing up for the patient portal called "MyChart".  Sign up information is provided on this After Visit Summary.  MyChart is used to connect with patients for Virtual Visits (Telemedicine).  Patients are able to view lab/test results, encounter notes, upcoming appointments, etc.  Non-urgent messages can be sent to your provider as well.   To learn more about what you can do with MyChart, go to [email or web address].    Your next appointment:   6 month(s)  Provider:   Dr. Bjorn Pippin   Other Instructions Referral to Pharm D - LIPIDS

## 2024-01-04 ENCOUNTER — Other Ambulatory Visit: Payer: Self-pay | Admitting: Internal Medicine

## 2024-01-04 DIAGNOSIS — I700 Atherosclerosis of aorta: Secondary | ICD-10-CM

## 2024-01-04 DIAGNOSIS — E785 Hyperlipidemia, unspecified: Secondary | ICD-10-CM

## 2024-01-14 ENCOUNTER — Ambulatory Visit
Admission: RE | Admit: 2024-01-14 | Discharge: 2024-01-14 | Disposition: A | Discharge status: Home / Self Care | Payer: Managed Care, Other (non HMO) | Source: Ambulatory Visit | Attending: Internal Medicine | Admitting: Internal Medicine

## 2024-01-14 DIAGNOSIS — Z1231 Encounter for screening mammogram for malignant neoplasm of breast: Secondary | ICD-10-CM

## 2024-01-16 ENCOUNTER — Other Ambulatory Visit (HOSPITAL_BASED_OUTPATIENT_CLINIC_OR_DEPARTMENT_OTHER): Payer: Self-pay | Admitting: Cardiology

## 2024-01-28 ENCOUNTER — Ambulatory Visit: Payer: Managed Care, Other (non HMO) | Admitting: Dermatology

## 2024-02-11 ENCOUNTER — Encounter: Payer: Self-pay | Admitting: Pharmacist Clinician (PhC)/ Clinical Pharmacy Specialist

## 2024-02-11 ENCOUNTER — Ambulatory Visit
Payer: Managed Care, Other (non HMO) | Attending: Cardiology | Admitting: Pharmacist Clinician (PhC)/ Clinical Pharmacy Specialist

## 2024-02-11 DIAGNOSIS — T466X5D Adverse effect of antihyperlipidemic and antiarteriosclerotic drugs, subsequent encounter: Secondary | ICD-10-CM | POA: Diagnosis not present

## 2024-02-11 DIAGNOSIS — G720 Drug-induced myopathy: Secondary | ICD-10-CM

## 2024-02-11 DIAGNOSIS — E785 Hyperlipidemia, unspecified: Secondary | ICD-10-CM

## 2024-02-11 MED ORDER — NEXLETOL 180 MG PO TABS: 180.0000 mg | ORAL_TABLET | Freq: Every day | ORAL | 0 refills | Status: DC

## 2024-02-11 NOTE — Patient Instructions (Addendum)
 Your Results:             Your most recent labs Goal  Total Cholesterol 165 < 200  Triglycerides 122 < 150  HDL (happy/good cholesterol) 46 > 40  LDL (lousy/bad cholesterol 97 < 55   Medication changes:  We will start the process to get Repatha covered by your insurance.  Once the prior authorization is complete, I will call/send a MyChart message to let you know and confirm pharmacy information.   You will take one injection every 14 days  Try the Nexletol (without ezetimibe) once daily to see if the muscle aches go away.   Lab orders:  We want to repeat labs after 2-3 months.  We will send you a lab order to remind you once we get closer to that time.    Patient Assistance:    Repatha Co-pay Card Instructions 1.      Go to [email or web address]  2.      Click the red "Repahta Co-Pay Card" button near the top right 3.      Scroll down and click "Yes, I have a Repatha prescription 4.      Continue to scroll down and selected "[employer]"  5.      Continue to scroll down and for the question "Are you eligible for Medicare but receiving prescription drug coverage from a former employer, union, or welfare plan?" select "No" 6.      Continue to scroll down and click the first box which is next to "Repatha Co-Pay Card, and beneath that, select "I want to enroll in the Repatha Co-Pay Card Program" 7.      Continue to scroll down until you see the "Next" button, then click it 8.      Fill out your personal information then click the "Next" button    Thank you for choosing [facility]

## 2024-02-11 NOTE — Assessment & Plan Note (Signed)
 Assessment: Patient with ASCVD not at LDL goal of < 55 Most recent LDL 97 on 09/21/2023 Not able to tolerate statins secondary to myalgias - tried 4 different statins Noted myalgias with ezetimibe as well, currently taking Nexlizet and feeling similar issues.   Reviewed options for lowering LDL cholesterol, including ezetimibe, PCSK-9 inhibitors, bempedoic acid and inclisiran.  Discussed mechanisms of action, dosing, side effects, potential decreases in LDL cholesterol and costs.  Also reviewed potential options for patient assistance.  Plan: Patient agreeable to starting Repatha 140 mg q14d Will switch Nexlizet to Nexletol to see if we can eliminate myalgias Repeat labs after:  3 months Lipid Liver function Patient was given information on copay cards for both medications

## 2024-02-11 NOTE — Progress Notes (Signed)
 Office Visit    Patient Name: Brianna Lambert Date of Encounter: 02/11/2024  Primary Care Provider:  Etta Grandchild, MD Primary Cardiologist:  None  Chief Complaint    Hyperlipidemia   Significant Past Medical History   CAD 07/24/23 CAC = 1195 (99th percentile), 07/31/23 LHC 80% OM2 stenosis (not amenable to PCI)50% D1, 55% dLCx, 40% mLAD  HTN Controlled on olmesartan and amlodipine  DM2 8/24 A1c 7.8 on insulin, metformin     Allergies  Allergen Reactions   Crestor [Rosuvastatin] Other (See Comments)    Muscle aches   Codeine Nausea Only   Pravastatin Other (See Comments)    myalgias    History of Present Illness    Brianna Lambert is a 68 y.o. female patient of Dr Bjorn Pippin, in the office today to discuss options for cholesterol management.  She has tried at least 4 different statins over they years, but unable to tolerate secondary to myalgias.  She also tried ezetimibe, with similar response, but admits she wasn't sure it it was always given at same time as the statins.  She is currently taking Nexlizet, and does note some myalgias with this.    Insurance Carrier: Counselling psychologist:   [employer], Transport planner [address]  [employer]:  no, copay card information given    LDL Cholesterol goal:  LDL < 55  Current Medications:   Nexlizet 180/10  Previously tried:  atorvastatin, rosuvastatin, pravastatin, pitavstatin - myalgias Ezetimibe - (took with statin) Bempedoic acid - having some muscle aches  Family Hx:   mother had AF, died from MI, father had AV replacement  Social Hx: Tobacco: no Alcohol:    no   Diet: eats more home cooked foods; admits doesn't eat much protein, but does get regular vegetables.        Accessory Clinical Findings   Lab Results  Component Value Date   CHOL 165 09/21/2023   HDL 46 09/21/2023   LDLCALC 97 09/21/2023   LDLDIRECT 155.6 07/11/2012   TRIG 122 09/21/2023   CHOLHDL 3.6 09/21/2023     Lipoprotein (a)  Date/Time Value Ref Range Status  06/25/2023 04:11 PM 194 (H) <75 nmol/L Final    Comment:    . Risk Category   Optimal        < 75 nmol/L   Moderate   75 - 125 nmol/L   High          > 125 nmol/L . Cardiovascular event risk category cut points (optimal, moderate, high) are based on Tsimika S. JACC 2017;69:692-711. .     Lab Results  Component Value Date   ALT 14 10/24/2022   AST 18 10/24/2022   ALKPHOS 43 10/24/2022   BILITOT 0.4 10/24/2022   Lab Results  Component Value Date   CREATININE 0.85 07/27/2023   BUN 14 07/27/2023   NA 138 07/27/2023   K 4.1 07/27/2023   CL 101 07/27/2023   CO2 23 07/27/2023   Lab Results  Component Value Date   HGBA1C 7.8 (H) 06/25/2023    Home Medications    Current Outpatient Medications  Medication Sig Dispense Refill   Bempedoic Acid (NEXLETOL) 180 MG TABS Take 1 tablet (180 mg total) by mouth daily. 14 tablet 0   amLODipine (NORVASC) 5 MG tablet TAKE 1 TABLET DAILY 90 tablet 3   aspirin EC 81 MG tablet Take 1 tablet (81 mg total) by mouth daily. Swallow whole.     Cholecalciferol (VITAMIN D3)  1.25 MG (50000 UT) CAPS Take 50,000 Units by mouth once a week.     Continuous Glucose Receiver (FREESTYLE LIBRE 3 READER) DEVI 1 Act by Does not apply route daily. 1 each 3   Continuous Glucose Sensor (FREESTYLE LIBRE 3 SENSOR) MISC 1 Act by Does not apply route daily. Place 1 sensor on the skin every 14 days. Use to check glucose continuously 2 each 5   Insulin Pen Needle 32G X 6 MM MISC 1 Act by Does not apply route once a week. 100 each 1   metFORMIN (GLUCOPHAGE-XR) 750 MG 24 hr tablet Take 1 tablet (750 mg total) by mouth daily with breakfast. 180 tablet 1   metoprolol succinate (TOPROL XL) 25 MG 24 hr tablet Take 1 tablet (25 mg total) by mouth daily. 90 tablet 3   montelukast (SINGULAIR) 10 MG tablet TAKE 1 TABLET AT BEDTIME 90 tablet 1   nabumetone (RELAFEN) 500 MG tablet TAKE 1 TABLET TWICE A DAY AS NEEDED 180 tablet  0   nitroGLYCERIN (NITROSTAT) 0.4 MG SL tablet PLACE 1 TABLET UNDER THE TONGUE EVERY 5 MINS AS NEEDED FOR CHEST PAIN X 3 DOSES MAX IF SYMPTONS NOT RELIEVED THEN CALL 911 30 tablet 11   olmesartan (BENICAR) 40 MG tablet TAKE 1 TABLET DAILY 90 tablet 0   SOLIQUA 100-33 UNT-MCG/ML SOPN INJECT 20 UNITS UNDER THE SKIN DAILY 15 mL 4   No current facility-administered medications for this visit.     Assessment & Plan    Hyperlipidemia LDL goal <55 Assessment: Patient with ASCVD not at LDL goal of < 55 Most recent LDL 97 on 09/21/2023 Not able to tolerate statins secondary to myalgias - tried 4 different statins Noted myalgias with ezetimibe as well, currently taking Nexlizet and feeling similar issues.   Reviewed options for lowering LDL cholesterol, including ezetimibe, PCSK-9 inhibitors, bempedoic acid and inclisiran.  Discussed mechanisms of action, dosing, side effects, potential decreases in LDL cholesterol and costs.  Also reviewed potential options for patient assistance.  Plan: Patient agreeable to starting Repatha 140 mg q14d Will switch Nexlizet to Nexletol to see if we can eliminate myalgias Repeat labs after:  3 months Lipid Liver function Patient was given information on copay cards for both medications   Phillips Hay, PharmD CPP [facility] [address]  [place], [place] [address] [phone]  02/11/2024, 4:59 PM

## 2024-02-20 ENCOUNTER — Other Ambulatory Visit: Payer: Self-pay | Admitting: Orthopedic Surgery

## 2024-02-20 DIAGNOSIS — M5416 Radiculopathy, lumbar region: Secondary | ICD-10-CM

## 2024-03-03 ENCOUNTER — Ambulatory Visit: Payer: Managed Care, Other (non HMO) | Admitting: Dermatology

## 2024-03-03 ENCOUNTER — Encounter: Payer: Self-pay | Admitting: Dermatology

## 2024-03-03 VITALS — BP 147/82 | HR 60

## 2024-03-03 DIAGNOSIS — L409 Psoriasis, unspecified: Secondary | ICD-10-CM | POA: Diagnosis not present

## 2024-03-03 DIAGNOSIS — Z79899 Other long term (current) drug therapy: Secondary | ICD-10-CM

## 2024-03-03 DIAGNOSIS — L400 Psoriasis vulgaris: Secondary | ICD-10-CM

## 2024-03-03 MED ORDER — CLOBETASOL PROPIONATE 0.05 % EX OINT
1.0000 | TOPICAL_OINTMENT | Freq: Two times a day (BID) | CUTANEOUS | 2 refills | Status: DC
Start: 2024-03-03 — End: 2024-03-13

## 2024-03-03 MED ORDER — CLOBETASOL PROPIONATE 0.05 % EX SOLN
1.0000 | Freq: Two times a day (BID) | CUTANEOUS | 2 refills | Status: AC
Start: 2024-03-03 — End: ?

## 2024-03-03 NOTE — Progress Notes (Unsigned)
   New Patient Visit   Subjective  Brianna Lambert is a 68 y.o. female who presents for the following: psoriasis  Pt here for psoriasis for several years she'd like to have treated. It has been present in her scalp, back, lower extremities. She also is having joint pains in her knees and hands and legs. Lesions have previously been treated with topical steroids by PCP and oral steroids   The following portions of the chart were reviewed this encounter and updated as appropriate: medications, allergies, medical history  Review of Systems:  No other skin or systemic complaints except as noted in HPI or Assessment and Plan.  Objective  Well appearing patient in no apparent distress; mood and affect are within normal limits.  A focused examination was performed of the following areas: Scalp, back and legs  Relevant exam findings are noted in the Assessment and Plan.    Assessment & Plan   PSORIASIS Exam: Well-demarcated erythematous papules/plaques with silvery scale, guttate pink scaly papules. 10-15% BSA.  Flared Patient endorses joint pains in legs, knees, hands  Psoriasis is a chronic non-curable, but treatable genetic/hereditary disease that may have other systemic features affecting other organ systems such as joints (Psoriatic Arthritis). It is associated with an increased risk of inflammatory bowel disease, heart disease, non-alcoholic fatty liver disease, and depression.  Treatments include light and laser treatments; topical medications; and systemic medications including oral and injectables.  Treatment Plan: Will refer for Rheumatology referral to evaluate for PsA Patient has failed topical steroids, will need biologic therapy; cannot tale Otzela due to diarrhea  Pt is not a candidate for methotrexate or cyclosporine due to inability for follow up labs and contraindications with other medications. Pt has no access to a light box for phototherapy.  Due to the  progressive and chronic nature of her psoriasis, patient has tried and failed numerous topicals creams as noted above, the next best therapeutic option is a systemic therapy. It is medically necessary to help improve her quality of life.   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 Cristy Folks is an IL-23 inhibitor that has demonstrated efficacy in reducing psoriatic plaque burden and improving quality of life with a favorable safety profile. Prior to starting therapy, baseline laboratory testing will be required, including a complete blood count (CBC), comprehensive metabolic panel (CMP), hepatitis B and C screening, and tuberculosis (TB) screening, in accordance with standard guidelines for biologic initiation. The importance of completing these labs before the first dose was discussed, as well as the need for ongoing monitoring during treatment. The patient voiced understanding and agreement with the plan and will proceed with lab work prior to scheduling the first [employer] injection - Comprehensive metabolic panel - CBC with Differential/Platelet - Hepatitis B surface antibody,qualitative - Hepatitis B surface antigen - Hepatitis C antibody - QuantiFERON-TB Gold Plus - clobetasol (TEMOVATE) 0.05 % external solution; Apply 1 Application topically 2 (two) times daily.  Dispense: 50 mL; Refill: 2 - clobetasol ointment (TEMOVATE) 0.05 %; Apply 1 Application topically 2 (two) times daily.  Dispense: 60 g; Refill: 2  Return in about 3 days (around 03/06/2024) for psoriasis/biologic labwork.   We spent 45 min reviewing records, taking the patient history, providing face to face care with the patient, sending prescriptions.  Documentation: I have reviewed the above documentation for accuracy and completeness, and I agree with the above.  Gwenith Daily, MD

## 2024-03-03 NOTE — Patient Instructions (Signed)

## 2024-03-04 ENCOUNTER — Ambulatory Visit
Admission: RE | Admit: 2024-03-04 | Discharge: 2024-03-04 | Disposition: A | Discharge status: Home / Self Care | Source: Ambulatory Visit | Attending: Orthopedic Surgery | Admitting: Orthopedic Surgery

## 2024-03-04 ENCOUNTER — Other Ambulatory Visit: Payer: Self-pay | Admitting: Internal Medicine

## 2024-03-04 DIAGNOSIS — E119 Type 2 diabetes mellitus without complications: Secondary | ICD-10-CM

## 2024-03-04 DIAGNOSIS — E118 Type 2 diabetes mellitus with unspecified complications: Secondary | ICD-10-CM

## 2024-03-04 DIAGNOSIS — I10 Essential (primary) hypertension: Secondary | ICD-10-CM

## 2024-03-04 DIAGNOSIS — M5416 Radiculopathy, lumbar region: Secondary | ICD-10-CM

## 2024-03-05 ENCOUNTER — Other Ambulatory Visit: Payer: Self-pay

## 2024-03-05 LAB — CBC WITH DIFFERENTIAL/PLATELET
Basophils Absolute: 0.1 10*3/uL (ref 0.0–0.2)
Basos: 1 %
EOS (ABSOLUTE): 0.2 10*3/uL (ref 0.0–0.4)
Eos: 2 %
Hematocrit: 39.6 % (ref 34.0–46.6)
Hemoglobin: 13.3 g/dL (ref 11.1–15.9)
Immature Grans (Abs): 0.1 10*3/uL (ref 0.0–0.1)
Immature Granulocytes: 1 %
Lymphocytes Absolute: 2.4 10*3/uL (ref 0.7–3.1)
Lymphs: 29 %
MCH: 31.1 pg (ref 26.6–33.0)
MCHC: 33.6 g/dL (ref 31.5–35.7)
MCV: 93 fL (ref 79–97)
Monocytes Absolute: 0.8 10*3/uL (ref 0.1–0.9)
Monocytes: 10 %
Neutrophils Absolute: 4.7 10*3/uL (ref 1.4–7.0)
Neutrophils: 57 %
Platelets: 301 10*3/uL (ref 150–450)
RBC: 4.28 x10E6/uL (ref 3.77–5.28)
RDW: 12.4 % (ref 11.7–15.4)
WBC: 8.2 10*3/uL (ref 3.4–10.8)

## 2024-03-05 LAB — QUANTIFERON-TB GOLD PLUS
QuantiFERON Mitogen Value: >10 [IU]/mL
QuantiFERON Nil Value: 0.05 [IU]/mL
QuantiFERON TB1 Ag Value: 0.06 [IU]/mL
QuantiFERON TB2 Ag Value: 0.06 [IU]/mL
QuantiFERON-TB Gold Plus: NEGATIVE

## 2024-03-05 LAB — COMPREHENSIVE METABOLIC PANEL WITH GFR
ALT: 25 IU/L (ref 0–32)
AST: 29 IU/L (ref 0–40)
Albumin: 4 g/dL (ref 3.9–4.9)
Alkaline Phosphatase: 48 IU/L (ref 44–121)
BUN/Creatinine Ratio: 26 (ref 12–28)
BUN: 21 mg/dL (ref 8–27)
Bilirubin Total: 0.2 mg/dL (ref 0.0–1.2)
CO2: 19 mmol/L — ABNORMAL LOW (ref 20–29)
Calcium: 9.1 mg/dL (ref 8.7–10.3)
Chloride: 106 mmol/L (ref 96–106)
Creatinine, Ser: 0.82 mg/dL (ref 0.57–1.00)
Globulin, Total: 2.4 g/dL (ref 1.5–4.5)
Glucose: 139 mg/dL — ABNORMAL HIGH (ref 70–99)
Potassium: 4.4 mmol/L (ref 3.5–5.2)
Sodium: 139 mmol/L (ref 134–144)
Total Protein: 6.4 g/dL (ref 6.0–8.5)
eGFR: 78 mL/min/{1.73_m2} (ref >59)

## 2024-03-05 LAB — HEPATITIS B SURFACE ANTIBODY,QUALITATIVE: Hep B Surface Ab, Qual: NONREACTIVE

## 2024-03-05 LAB — HEPATITIS C ANTIBODY: Hep C Virus Ab: NONREACTIVE

## 2024-03-05 LAB — HEPATITIS B SURFACE ANTIGEN: Hepatitis B Surface Ag: NEGATIVE

## 2024-03-05 MED ORDER — SKYRIZI PEN 150 MG/ML ~~LOC~~ SOAJ: 150.0000 mg | SUBCUTANEOUS | 3 refills | Status: DC

## 2024-03-05 MED ORDER — SKYRIZI PEN 150 MG/ML ~~LOC~~ SOAJ: 150.0000 mg | SUBCUTANEOUS | 1 refills | Status: DC

## 2024-03-06 ENCOUNTER — Ambulatory Visit (INDEPENDENT_AMBULATORY_CARE_PROVIDER_SITE_OTHER): Admitting: Dermatology

## 2024-03-06 ENCOUNTER — Encounter: Payer: Self-pay | Admitting: Dermatology

## 2024-03-06 VITALS — BP 150/80 | HR 70

## 2024-03-06 DIAGNOSIS — Z79899 Other long term (current) drug therapy: Secondary | ICD-10-CM | POA: Diagnosis not present

## 2024-03-06 DIAGNOSIS — L409 Psoriasis, unspecified: Secondary | ICD-10-CM | POA: Diagnosis not present

## 2024-03-06 DIAGNOSIS — L400 Psoriasis vulgaris: Secondary | ICD-10-CM

## 2024-03-06 NOTE — Progress Notes (Signed)
 Follow-Up Visit   Subjective  Brianna Lambert is a 68 y.o. female who presents for the following: Patient is here for injection training and initial injection of Skyrizi . Labs were stable.   The patient has spots, moles and lesions to be evaluated, some may be new or changing and the patient may have concern these could be cancer.  The following portions of the chart were reviewed this encounter and updated as appropriate: medications, allergies, medical history  Review of Systems:  No other skin or systemic complaints except as noted in HPI or Assessment and Plan.  Objective  Well appearing patient in no apparent distress; mood and affect are within normal limits.   A focused examination was performed of the following areas:  Left thigh  Relevant exam findings are noted in the Assessment and Plan.    Assessment & Plan  PSORIASIS on Systemic Treatment Exam: Well-demarcated erythematous papules/plaques with silvery scale, guttate pink scaly papules. 10-15%   flared  patient endorses joint pain in leg, knees and hands   Psoriasis - severe on systemic treatment.  Psoriasis is a chronic non-curable, but treatable genetic/hereditary disease that may have other systemic features affecting other organ systems such as joints (Psoriatic Arthritis).  It is linked with heart disease, inflammatory bowel disease, non-alcoholic fatty liver disease, and depression. Significant skin psoriasis and/or psoriatic arthritis may have significant symptoms and affects activities of daily activity and often benefits from systemic treatments.  These systemic treatments have some potential side effects including immunosuppression and require pre-treatment laboratory screening and periodic laboratory monitoring and periodic in person evaluation and monitoring by the attending dermatologist physician (long term medication management).   Reviewed risks of biologics including immunosuppression,  infections, injection site reaction, and failure to improve condition. Goal is control of skin condition, not cure.  Some older biologics such as Humira and Enbrel may slightly increase risk of malignancy and may worsen congestive heart failure.  Taltz and Cosentyx may cause inflammatory bowel disease to flare. The use of biologics requires long term medication management, including periodic office visits and monitoring of blood work.   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 Skyrizi  is an IL-23 inhibitor that has demonstrated efficacy in reducing psoriatic plaque burden and improving quality of life with a favorable safety profile. Prior to starting therapy, baseline laboratory testing will be required, including a complete blood count (CBC), comprehensive metabolic panel (CMP), hepatitis B and C screening, and tuberculosis (TB) screening, in accordance with standard guidelines for biologic initiation. The importance of completing these labs before the first dose was discussed, as well as the need for ongoing monitoring during treatment. The patient voiced understanding and agreement with the plan and will proceed with lab work prior to scheduling the first Skyrizi  injection.  Patient came in today for injection training and provided with a sample for the initial dosage.    Long term medication management.  Patient is using long term (months to years) prescription medication  to control their dermatologic condition.  These medications require periodic monitoring to evaluate for efficacy and side effects and may require periodic laboratory monitoring.     PSORIASIS VULGARIS   Related Medications clobetasol  (TEMOVATE ) 0.05 % external solution Apply 1 Application topically 2 (two) times daily. clobetasol  ointment (TEMOVATE ) 0.05 % Apply 1 Application topically 2 (two) times daily. HIGH RISK  MEDICATION USE    Return in about 4 weeks (around  04/03/2024).  I, Haig Levan, Surg Tech III, am acting as scribe for Deneise Finlay, MD.   Documentation: I have reviewed the above documentation for accuracy and completeness, and I agree with the above.  Deneise Finlay, MD

## 2024-03-13 ENCOUNTER — Other Ambulatory Visit: Payer: Self-pay | Admitting: Dermatology

## 2024-03-13 ENCOUNTER — Ambulatory Visit: Admitting: Internal Medicine

## 2024-03-13 ENCOUNTER — Telehealth: Payer: Self-pay

## 2024-03-13 ENCOUNTER — Encounter: Payer: Self-pay | Admitting: Internal Medicine

## 2024-03-13 VITALS — BP 134/78 | HR 55 | Temp 98.0°F | Ht 66.0 in | Wt 264.6 lb

## 2024-03-13 DIAGNOSIS — L400 Psoriasis vulgaris: Secondary | ICD-10-CM

## 2024-03-13 DIAGNOSIS — E118 Type 2 diabetes mellitus with unspecified complications: Secondary | ICD-10-CM | POA: Diagnosis not present

## 2024-03-13 DIAGNOSIS — E785 Hyperlipidemia, unspecified: Secondary | ICD-10-CM

## 2024-03-13 DIAGNOSIS — Z0001 Encounter for general adult medical examination with abnormal findings: Secondary | ICD-10-CM

## 2024-03-13 DIAGNOSIS — I10 Essential (primary) hypertension: Secondary | ICD-10-CM

## 2024-03-13 DIAGNOSIS — Z0000 Encounter for general adult medical examination without abnormal findings: Secondary | ICD-10-CM

## 2024-03-13 DIAGNOSIS — Z7984 Long term (current) use of oral hypoglycemic drugs: Secondary | ICD-10-CM

## 2024-03-13 DIAGNOSIS — Z7985 Long-term (current) use of injectable non-insulin antidiabetic drugs: Secondary | ICD-10-CM

## 2024-03-13 LAB — BASIC METABOLIC PANEL WITH GFR
BUN: 15 mg/dL (ref 6–23)
CO2: 25 meq/L (ref 19–32)
Calcium: 8.8 mg/dL (ref 8.4–10.5)
Chloride: 101 meq/L (ref 96–112)
Creatinine, Ser: 0.8 mg/dL (ref 0.40–1.20)
GFR: 75.57 mL/min (ref >60.00)
Glucose, Bld: 167 mg/dL — ABNORMAL HIGH (ref 70–99)
Potassium: 4.2 meq/L (ref 3.5–5.1)
Sodium: 134 meq/L — ABNORMAL LOW (ref 135–145)

## 2024-03-13 LAB — URINALYSIS, ROUTINE W REFLEX MICROSCOPIC
Bilirubin Urine: NEGATIVE
Hgb urine dipstick: NEGATIVE
Ketones, ur: NEGATIVE
Leukocytes,Ua: NEGATIVE
Nitrite: NEGATIVE
RBC / HPF: NONE SEEN (ref 0–?)
Specific Gravity, Urine: <=1.005 — AB (ref 1.000–1.030)
Total Protein, Urine: NEGATIVE
Urine Glucose: NEGATIVE
Urobilinogen, UA: 0.2 (ref 0.0–1.0)
WBC, UA: NONE SEEN (ref 0–?)
pH: 6 (ref 5.0–8.0)

## 2024-03-13 LAB — MICROALBUMIN / CREATININE URINE RATIO
Creatinine,U: 40.3 mg/dL
Microalb Creat Ratio: UNDETERMINED mg/g (ref 0.0–30.0)
Microalb, Ur: <0.7 mg/dL

## 2024-03-13 LAB — TSH: TSH: 0.59 u[IU]/mL (ref 0.35–5.50)

## 2024-03-13 LAB — HEMOGLOBIN A1C: Hgb A1c MFr Bld: 8.2 % — ABNORMAL HIGH (ref 4.6–6.5)

## 2024-03-13 MED ORDER — FREESTYLE LIBRE 3 READER DEVI: 1.0000 | Freq: Every day | 3 refills | Status: DC

## 2024-03-13 MED ORDER — OZEMPIC (0.25 OR 0.5 MG/DOSE) 2 MG/3ML ~~LOC~~ SOPN
0.2500 mg | PEN_INJECTOR | SUBCUTANEOUS | 0 refills | Status: DC
Start: 2024-03-13 — End: 2024-04-23

## 2024-03-13 MED ORDER — INSULIN PEN NEEDLE 32G X 6 MM MISC
1.0000 | 1 refills | Status: DC
Start: 2024-03-13 — End: 2024-09-09

## 2024-03-13 MED ORDER — SKYRIZI PEN 150 MG/ML ~~LOC~~ SOAJ: 150.0000 mg | SUBCUTANEOUS | 11 refills | Status: AC

## 2024-03-13 MED ORDER — METFORMIN HCL ER 750 MG PO TB24
750.0000 mg | ORAL_TABLET | Freq: Every day | ORAL | 1 refills | Status: DC
Start: 2024-03-13 — End: 2024-06-10

## 2024-03-13 MED ORDER — FREESTYLE LIBRE 3 SENSOR MISC
1.0000 | Freq: Every day | 5 refills | Status: AC
Start: 2024-03-13 — End: ?

## 2024-03-13 MED ORDER — SKYRIZI PEN 150 MG/ML ~~LOC~~ SOAJ
150.0000 mg | SUBCUTANEOUS | 0 refills | Status: DC
Start: 2024-03-13 — End: 2024-09-09

## 2024-03-13 NOTE — Patient Instructions (Signed)

## 2024-03-13 NOTE — Progress Notes (Signed)
 Pt insurance required meds to be sent to [facility]

## 2024-03-13 NOTE — Progress Notes (Signed)
 Subjective:  Patient ID: Brianna Lambert, female    DOB: 07/23/1956  Age: 68 y.o. MRN: [id number]  CC: Hypertension, Hyperlipidemia, Diabetes, and Annual Exam   HPI Brianna Lambert presents for f/up ----  Discussed the use of AI scribe software for clinical note transcription with the patient, who gave verbal consent to proceed.  History of Present Illness   Brianna Lambert is a 68 year old female with psoriasis and hyperlipidemia who presents for follow-up and management of her conditions.  She has experienced significant improvement in her psoriasis symptoms following a Skyrizi  injection, with marked improvement in her scalp condition. Previously, she had a flare-up of psoriasis after being prescribed a prednisone  pack, which is not ideal for psoriasis management.  She has a history of hyperlipidemia and was previously on statins, which caused severe muscle and joint pain, leading to discontinuation. She is currently trying Nexletol , a non-statin medication, and reports no muscle aches since starting it. She has not had recent blood work to assess her cholesterol levels.  She mentions a history of back pain and underwent an MRI to check the status of her spinal fusion screws, but has not received results yet. She attributes her previous muscle pain to statin use and notes improvement since discontinuing them.  She is currently taking metformin  750 mg once daily for blood sugar control. She has not started insulin  injections yet.  No current muscle aches or back pain. She confirms having joint aches.       Outpatient Medications Prior to Visit  Medication Sig Dispense Refill   amLODipine  (NORVASC ) 5 MG tablet TAKE 1 TABLET DAILY 90 tablet 3   aspirin  EC 81 MG tablet Take 1 tablet (81 mg total) by mouth daily. Swallow whole.     Cholecalciferol  (VITAMIN D3) 1.25 MG (50000 UT) CAPS Take 50,000 Units by mouth once a week.     clobetasol  (TEMOVATE ) 0.05 % external solution  Apply 1 Application topically 2 (two) times daily. 50 mL 2   metoprolol  succinate (TOPROL  XL) 25 MG 24 hr tablet Take 1 tablet (25 mg total) by mouth daily. 90 tablet 3   montelukast  (SINGULAIR ) 10 MG tablet TAKE 1 TABLET AT BEDTIME 90 tablet 1   nabumetone  (RELAFEN ) 500 MG tablet TAKE 1 TABLET TWICE A DAY AS NEEDED 180 tablet 0   nitroGLYCERIN  (NITROSTAT ) 0.4 MG SL tablet PLACE 1 TABLET UNDER THE TONGUE EVERY 5 MINS AS NEEDED FOR CHEST PAIN X 3 DOSES MAX IF SYMPTONS NOT RELIEVED THEN CALL 911 30 tablet 11   olmesartan  (BENICAR ) 40 MG tablet TAKE 1 TABLET DAILY 90 tablet 0   Insulin  Pen Needle 32G X 6 MM MISC 1 Act by Does not apply route once a week. 100 each 1   metFORMIN  (GLUCOPHAGE -XR) 750 MG 24 hr tablet Take 1 tablet (750 mg total) by mouth daily with breakfast. 180 tablet 1   risankizumab -rzaa (SKYRIZI  PEN) 150 MG/ML pen Inject 1 mL (150 mg total) into the skin as directed. At Lambert 0 & 4. 1 mL 1   Bempedoic Acid  (NEXLETOL ) 180 MG TABS Take 1 tablet (180 mg total) by mouth daily. 14 tablet 0   clobetasol  ointment (TEMOVATE ) 0.05 % Apply 1 Application topically 2 (two) times daily. 60 g 2   Continuous Glucose Receiver (FREESTYLE LIBRE 3 READER) DEVI 1 Act by Does not apply route daily. (Patient not taking: Reported on 03/13/2024) 1 each 3   Continuous Glucose Sensor (FREESTYLE LIBRE 3 SENSOR) MISC 1  Act by Does not apply route daily. Place 1 sensor on the skin every 14 days. Use to check glucose continuously (Patient not taking: Reported on 03/13/2024) 2 each 5   risankizumab -rzaa (SKYRIZI  PEN) 150 MG/ML pen Inject 1 mL (150 mg total) into the skin as directed. Every 12 Lambert for maintenance. 1 mL 3   SOLIQUA  100-33 UNT-MCG/ML SOPN INJECT 20 UNITS UNDER THE SKIN DAILY (Patient not taking: Reported on 03/13/2024) 15 mL 4   No facility-administered medications prior to visit.    ROS Review of Systems  Constitutional:  Negative for appetite change, chills, diaphoresis, fatigue, fever and  unexpected weight change.  HENT: Negative.    Eyes: Negative.   Respiratory: Negative.  Negative for cough, chest tightness, shortness of breath and wheezing.   Cardiovascular:  Negative for chest pain, palpitations and leg swelling.  Gastrointestinal:  Negative for abdominal pain, constipation, diarrhea, nausea and vomiting.  Genitourinary: Negative.  Negative for difficulty urinating and dysuria.  Musculoskeletal:  Positive for arthralgias and back pain. Negative for joint swelling and myalgias.  Skin: Negative.   Neurological:  Negative for dizziness, weakness and light-headedness.  Hematological:  Negative for adenopathy. Does not bruise/bleed easily.  Psychiatric/Behavioral: Negative.      Objective:  BP 134/78 (BP Location: Left Arm, Patient Position: Sitting, Cuff Size: Large)   Pulse (!) 55   Temp 98 F (36.7 C) (Oral)   Ht 5\' 6"  (1.676 m)   Wt 264 lb 9.6 oz (120 kg)   SpO2 94%   BMI 42.71 kg/m   BP Readings from Last 3 Encounters:  03/13/24 134/78  03/06/24 (!) 150/80  03/03/24 (!) 147/82    Wt Readings from Last 3 Encounters:  03/13/24 264 lb 9.6 oz (120 kg)  12/17/23 258 lb (117 kg)  08/07/23 266 lb 12.8 oz (121 kg)    Physical Exam Vitals reviewed.  HENT:     Nose: Nose normal.     Mouth/Throat:     Mouth: Mucous membranes are moist.  Eyes:     General: No scleral icterus.    Conjunctiva/sclera: Conjunctivae normal.  Cardiovascular:     Rate and Rhythm: Regular rhythm. Bradycardia present.     Pulses: Normal pulses.     Heart sounds: No murmur heard.    No friction rub. No gallop.  Pulmonary:     Breath sounds: No stridor. No wheezing, rhonchi or rales.  Abdominal:     General: Abdomen is protuberant. Bowel sounds are decreased. There is no distension.     Palpations: There is no hepatomegaly, splenomegaly or mass.     Tenderness: There is no abdominal tenderness. There is no guarding or rebound.  Musculoskeletal:        General: Normal range of  motion.     Cervical back: Neck supple.     Right lower leg: No edema.     Left lower leg: No edema.  Lymphadenopathy:     Cervical: No cervical adenopathy.  Skin:    General: Skin is warm and dry.  Neurological:     General: No focal deficit present.     Mental Status: She is alert. Mental status is at baseline.  Psychiatric:        Mood and Affect: Mood normal.        Behavior: Behavior normal.     Lab Results  Component Value Date   WBC 8.2 03/03/2024   HGB 13.3 03/03/2024   HCT 39.6 03/03/2024   PLT 301 03/03/2024  GLUCOSE 167 (H) 03/13/2024   CHOL 165 09/21/2023   TRIG 122 09/21/2023   HDL 46 09/21/2023   LDLDIRECT 155.6 07/11/2012   LDLCALC 97 09/21/2023   ALT 25 03/03/2024   AST 29 03/03/2024   NA 134 (L) 03/13/2024   K 4.2 03/13/2024   CL 101 03/13/2024   CREATININE 0.80 03/13/2024   BUN 15 03/13/2024   CO2 25 03/13/2024   TSH 0.59 03/13/2024   INR 0.97 10/27/2011   HGBA1C 8.2 (H) 03/13/2024   MICROALBUR <0.7 03/13/2024    No results found.  Assessment & Plan:   Essential hypertension, benign- Her BP is well controlled. -     Urinalysis, Routine w reflex microscopic; Future -     TSH; Future -     Basic metabolic panel with GFR; Future  Type II diabetes mellitus with manifestations- She has not achieved her A1C goal. Will add a GLP-1 agonist. -     Microalbumin / creatinine urine ratio; Future -     Urinalysis, Routine w reflex microscopic; Future -     Hemoglobin A1c; Future -     HM Diabetes Foot Exam -     Ambulatory referral to Ophthalmology -     Basic metabolic panel with GFR; Future -     Ozempic  (0.25 or 0.5 MG/DOSE); Inject 0.25 mg into the skin once a week.  Dispense: 3 mL; Refill: 0 -     FreeStyle Libre 3 Reader; 1 Act by Does not apply route daily.  Dispense: 1 each; Refill: 3 -     FreeStyle Libre 3 Sensor; 1 Act by Does not apply route daily. Place 1 sensor on the skin every 14 days. Use to check glucose continuously  Dispense: 2  each; Refill: 5 -     Insulin  Pen Needle; 1 Act by Does not apply route once a week.  Dispense: 100 each; Refill: 1 -     metFORMIN  HCl ER; Take 1 tablet (750 mg total) by mouth daily with breakfast.  Dispense: 180 tablet; Refill: 1  Hyperlipidemia LDL goal <55- LDL goal achieved. Doing well on the statin  -     TSH; Future  Encounter for general adult medical examination with abnormal findings- Exam completed, labs reviewed, vaccines reviewed, cancer screenings are UTD, pt ed material was given.      Follow-up: Return in about 6 months (around 09/12/2024).  Sandra Crouch, MD

## 2024-03-13 NOTE — Telephone Encounter (Signed)
 Pharmacy Patient Advocate Encounter   Received notification from CoverMyMeds that prior authorization for Ozempic  (0.25 or 0.5 MG/DOSE) 2MG /3ML pen-injectors is required/requested.   Insurance verification completed.   The patient is insured through [employer] .   Per test claim: PA required; PA submitted to above mentioned insurance via CoverMyMeds Key/confirmation #/EOC [id number] Status is pending

## 2024-03-14 ENCOUNTER — Other Ambulatory Visit (HOSPITAL_COMMUNITY): Payer: Self-pay

## 2024-03-14 NOTE — Telephone Encounter (Signed)
 Pharmacy Patient Advocate Encounter  Received notification from [facility] that Prior Authorization for Ozempic  (0.25 or 0.5 MG/DOSE) 2MG /3ML pen-injectors has been APPROVED from 03/13/24 to 03/13/25. Ran test claim, Copay is $383.67. This test claim was processed through [facility]- copay amounts may vary at other pharmacies due to pharmacy/plan contracts, or as the patient moves through the different stages of their insurance plan.   PA #/Case ID/Reference #: [id number]  *patient has a $500 deductible which is why she has a high copay

## 2024-03-15 ENCOUNTER — Encounter: Payer: Self-pay | Admitting: Internal Medicine

## 2024-03-24 ENCOUNTER — Other Ambulatory Visit: Payer: Self-pay | Admitting: Internal Medicine

## 2024-03-24 DIAGNOSIS — J45998 Other asthma: Secondary | ICD-10-CM

## 2024-03-26 ENCOUNTER — Other Ambulatory Visit (HOSPITAL_COMMUNITY): Payer: Self-pay

## 2024-04-03 ENCOUNTER — Encounter: Payer: Self-pay | Admitting: Dermatology

## 2024-04-03 ENCOUNTER — Ambulatory Visit: Admitting: Dermatology

## 2024-04-03 DIAGNOSIS — Z79899 Other long term (current) drug therapy: Secondary | ICD-10-CM | POA: Diagnosis not present

## 2024-04-03 DIAGNOSIS — L409 Psoriasis, unspecified: Secondary | ICD-10-CM

## 2024-04-03 NOTE — Patient Instructions (Signed)

## 2024-04-03 NOTE — Progress Notes (Unsigned)
   Follow-Up Visit   Subjective  Brianna Lambert is a 68 y.o. female who presents for the following: psoriasis follow up currently on skyrizi . She is due for her second loading dose today. She states that she has already noticed an improvement in her scalp and her joint pain.  She is still using topicals.   The following portions of the chart were reviewed this encounter and updated as appropriate: medications, allergies, medical history  Review of Systems:  No other skin or systemic complaints except as noted in HPI or Assessment and Plan.  Objective  Well appearing patient in no apparent distress; mood and affect are within normal limits.  A focused examination was performed of the following areas: Scalp  Relevant exam findings are noted in the Assessment and Plan.    Assessment & Plan   PSORIASIS on Systemic Treatment Exam: Well-demarcated erythematous papules/plaques with silvery scale, guttate pink scaly papules. 80% BSA.  improved  patient denies joint pain  Labs good.   Psoriasis - severe on systemic treatment.  Psoriasis is a chronic non-curable, but treatable genetic/hereditary disease that may have other systemic features affecting other organ systems such as joints (Psoriatic Arthritis).  It is linked with heart disease, inflammatory bowel disease, non-alcoholic fatty liver disease, and depression. Significant skin psoriasis and/or psoriatic arthritis may have significant symptoms and affects activities of daily activity and often benefits from systemic treatments.  These systemic treatments have some potential side effects including immunosuppression and require pre-treatment laboratory screening and periodic laboratory monitoring and periodic in person evaluation and monitoring by the attending dermatologist physician (long term medication management).   Reviewed risks of biologics including immunosuppression, infections, injection site reaction, and failure to  improve condition. Goal is control of skin condition, not cure.  Some older biologics such as Humira and Enbrel may slightly increase risk of malignancy and may worsen congestive heart failure.  Taltz and Cosentyx may cause inflammatory bowel disease to flare. The use of biologics requires long term medication management, including periodic office visits and monitoring of blood work.   Treatment Plan: Continue Skyrizi  at home.  Long term medication management.  Patient is using long term (months to years) prescription medication  to control their dermatologic condition.  These medications require periodic monitoring to evaluate for efficacy and side effects and may require periodic laboratory monitoring.      Return in about 6 months (around 10/04/2024) for psoriasis follow up.  Maurine Sovereign, RN, am acting as scribe for Deneise Finlay, MD .   Documentation: I have reviewed the above documentation for accuracy and completeness, and I agree with the above.  Deneise Finlay, MD

## 2024-04-22 ENCOUNTER — Other Ambulatory Visit: Payer: Self-pay | Admitting: Internal Medicine

## 2024-04-22 DIAGNOSIS — E118 Type 2 diabetes mellitus with unspecified complications: Secondary | ICD-10-CM

## 2024-04-25 ENCOUNTER — Encounter: Payer: Self-pay | Admitting: Pharmacist Clinician (PhC)/ Clinical Pharmacy Specialist

## 2024-04-25 ENCOUNTER — Other Ambulatory Visit (HOSPITAL_COMMUNITY): Payer: Self-pay

## 2024-04-25 ENCOUNTER — Telehealth: Payer: Self-pay | Admitting: Pharmacy Technician

## 2024-04-25 ENCOUNTER — Telehealth: Payer: Self-pay | Admitting: Pharmacist Clinician (PhC)/ Clinical Pharmacy Specialist

## 2024-04-25 DIAGNOSIS — E785 Hyperlipidemia, unspecified: Secondary | ICD-10-CM

## 2024-04-25 NOTE — Telephone Encounter (Signed)
 Pharmacy Patient Advocate Encounter   Received notification from Pt Calls Messages that prior authorization for Repatha is required/requested.   Insurance verification completed.   The patient is insured through [employer] .   Per test claim: The current 04/25/24 day co-pay is, $50.00- one month.  No PA needed at this time. This test claim was processed through [facility]- copay amounts may vary at other pharmacies due to pharmacy/plan contracts, or as the patient moves through the different stages of their insurance plan.

## 2024-04-25 NOTE — Telephone Encounter (Signed)
 Please do PA for Repatha

## 2024-04-29 MED ORDER — REPATHA SURECLICK 140 MG/ML ~~LOC~~ SOAJ: 140.0000 mg | SUBCUTANEOUS | 3 refills | Status: AC

## 2024-04-29 NOTE — Addendum Note (Signed)
 Addended by: Jeronica Stlouis L on: 04/29/2024 11:51 AM   Modules accepted: Orders

## 2024-04-29 NOTE — Addendum Note (Signed)
 Addended by: Nora Sabey L on: 04/29/2024 11:49 AM   Modules accepted: Orders

## 2024-05-27 ENCOUNTER — Other Ambulatory Visit: Payer: Self-pay

## 2024-06-02 ENCOUNTER — Other Ambulatory Visit: Payer: Self-pay | Admitting: Internal Medicine

## 2024-06-02 DIAGNOSIS — E118 Type 2 diabetes mellitus with unspecified complications: Secondary | ICD-10-CM

## 2024-06-27 ENCOUNTER — Other Ambulatory Visit (HOSPITAL_COMMUNITY): Payer: Self-pay

## 2024-06-27 ENCOUNTER — Telehealth: Payer: Self-pay

## 2024-06-27 NOTE — Telephone Encounter (Signed)
 Patient no longer on this medication

## 2024-06-27 NOTE — Telephone Encounter (Signed)
 Pharmacy Patient Advocate Encounter   Received notification from CoverMyMeds that prior authorization for Nexlizet  180-10 is required/requested.   Insurance verification completed.   The patient is insured through [employer] .   Per test claim: per patient chart notes 02/11/24, patient no longer taking. Please advise

## 2024-07-17 ENCOUNTER — Other Ambulatory Visit: Payer: Self-pay | Admitting: Internal Medicine

## 2024-07-17 DIAGNOSIS — E118 Type 2 diabetes mellitus with unspecified complications: Secondary | ICD-10-CM

## 2024-09-02 ENCOUNTER — Other Ambulatory Visit: Payer: Self-pay | Admitting: Medical Genetics

## 2024-09-03 LAB — LIPID PANEL
Chol/HDL Ratio: 3.6 ratio (ref 0.0–4.4)
Cholesterol, Total: 153 mg/dL (ref 100–199)
HDL: 43 mg/dL (ref >39)
LDL Chol Calc (NIH): 87 mg/dL (ref 0–99)
Triglycerides: 131 mg/dL (ref 0–149)
VLDL Cholesterol Cal: 23 mg/dL (ref 5–40)

## 2024-09-04 ENCOUNTER — Ambulatory Visit: Payer: Self-pay | Admitting: Cardiology

## 2024-09-04 LAB — LIPOPROTEIN A (LPA): Lipoprotein (a): 157.9 nmol/L — ABNORMAL HIGH (ref <75.0)

## 2024-09-09 ENCOUNTER — Other Ambulatory Visit: Payer: Self-pay | Admitting: Internal Medicine

## 2024-09-09 ENCOUNTER — Ambulatory Visit: Admitting: Internal Medicine

## 2024-09-09 ENCOUNTER — Encounter: Payer: Self-pay | Admitting: Internal Medicine

## 2024-09-09 ENCOUNTER — Ambulatory Visit: Payer: Self-pay | Admitting: Internal Medicine

## 2024-09-09 VITALS — BP 136/76 | HR 63 | Temp 98.1°F | Resp 16 | Ht 66.0 in | Wt 259.8 lb

## 2024-09-09 DIAGNOSIS — E785 Hyperlipidemia, unspecified: Secondary | ICD-10-CM | POA: Diagnosis not present

## 2024-09-09 DIAGNOSIS — Z794 Long term (current) use of insulin: Secondary | ICD-10-CM

## 2024-09-09 DIAGNOSIS — E118 Type 2 diabetes mellitus with unspecified complications: Secondary | ICD-10-CM

## 2024-09-09 DIAGNOSIS — M150 Primary generalized (osteo)arthritis: Secondary | ICD-10-CM | POA: Diagnosis not present

## 2024-09-09 DIAGNOSIS — E119 Type 2 diabetes mellitus without complications: Secondary | ICD-10-CM

## 2024-09-09 DIAGNOSIS — I10 Essential (primary) hypertension: Secondary | ICD-10-CM

## 2024-09-09 DIAGNOSIS — Z0001 Encounter for general adult medical examination with abnormal findings: Secondary | ICD-10-CM

## 2024-09-09 DIAGNOSIS — Z0000 Encounter for general adult medical examination without abnormal findings: Secondary | ICD-10-CM

## 2024-09-09 LAB — BASIC METABOLIC PANEL WITH GFR
BUN: 17 mg/dL (ref 6–23)
CO2: 24 meq/L (ref 19–32)
Calcium: 9.1 mg/dL (ref 8.4–10.5)
Chloride: 102 meq/L (ref 96–112)
Creatinine, Ser: 0.9 mg/dL (ref 0.40–1.20)
GFR: 65.38 mL/min (ref >60.00)
Glucose, Bld: 148 mg/dL — ABNORMAL HIGH (ref 70–99)
Potassium: 4.2 meq/L (ref 3.5–5.1)
Sodium: 135 meq/L (ref 135–145)

## 2024-09-09 LAB — URINALYSIS, ROUTINE W REFLEX MICROSCOPIC
Bilirubin Urine: NEGATIVE
Hgb urine dipstick: NEGATIVE
Ketones, ur: NEGATIVE
Leukocytes,Ua: NEGATIVE
Nitrite: NEGATIVE
RBC / HPF: NONE SEEN (ref 0–?)
Specific Gravity, Urine: 1.02 (ref 1.000–1.030)
Total Protein, Urine: NEGATIVE
Urine Glucose: NEGATIVE
Urobilinogen, UA: 0.2 (ref 0.0–1.0)
pH: 6 (ref 5.0–8.0)

## 2024-09-09 LAB — CBC WITH DIFFERENTIAL/PLATELET
Basophils Absolute: 0.1 K/uL (ref 0.0–0.1)
Basophils Relative: 0.7 % (ref 0.0–3.0)
Eosinophils Absolute: 0.2 K/uL (ref 0.0–0.7)
Eosinophils Relative: 2.4 % (ref 0.0–5.0)
HCT: 42.1 % (ref 36.0–46.0)
Hemoglobin: 14.3 g/dL (ref 12.0–15.0)
Lymphocytes Relative: 23.9 % (ref 12.0–46.0)
Lymphs Abs: 1.8 K/uL (ref 0.7–4.0)
MCHC: 34 g/dL (ref 30.0–36.0)
MCV: 89.4 fl (ref 78.0–100.0)
Monocytes Absolute: 0.8 K/uL (ref 0.1–1.0)
Monocytes Relative: 10.7 % (ref 3.0–12.0)
Neutro Abs: 4.7 K/uL (ref 1.4–7.7)
Neutrophils Relative %: 62.3 % (ref 43.0–77.0)
Platelets: 302 K/uL (ref 150.0–400.0)
RBC: 4.71 Mil/uL (ref 3.87–5.11)
RDW: 13.5 % (ref 11.5–15.5)
WBC: 7.5 K/uL (ref 4.0–10.5)

## 2024-09-09 LAB — HEPATIC FUNCTION PANEL
ALT: 15 U/L (ref 0–35)
AST: 15 U/L (ref 0–37)
Albumin: 4.1 g/dL (ref 3.5–5.2)
Alkaline Phosphatase: 52 U/L (ref 39–117)
Bilirubin, Direct: 0.1 mg/dL (ref 0.0–0.3)
Total Bilirubin: 0.5 mg/dL (ref 0.2–1.2)
Total Protein: 7 g/dL (ref 6.0–8.3)

## 2024-09-09 LAB — CK: Total CK: 56 U/L (ref 17–177)

## 2024-09-09 LAB — HEMOGLOBIN A1C: Hgb A1c MFr Bld: 7.6 % — ABNORMAL HIGH (ref 4.6–6.5)

## 2024-09-09 MED ORDER — MELOXICAM 15 MG PO TABS: 15.0000 mg | ORAL_TABLET | Freq: Every day | ORAL | 0 refills | Status: DC

## 2024-09-09 MED ORDER — COVID-19 MRNA VAC-TRIS(PFIZER) 30 MCG/0.3ML IM SUSY: 0.3000 mL | PREFILLED_SYRINGE | Freq: Once | INTRAMUSCULAR | 0 refills | Status: AC

## 2024-09-09 NOTE — Patient Instructions (Signed)

## 2024-09-09 NOTE — Progress Notes (Signed)
 Subjective:  Patient ID: Brianna Lambert, female    DOB: [birth year]  Age: 68 y.o. MRN: [id number]  CC: Annual Exam, Hypertension, Diabetes, Hyperlipidemia, and Osteoarthritis   HPI Brianna Lambert presents for a CPX and f/up -  Discussed the use of AI scribe software for clinical note transcription with the patient, who gave verbal consent to proceed.  History of Present Illness Brianna Lambert is a 68 year old female who presents for follow-up on her joint pain and medication management.  She has had an MRI since her last visit for her arthritis, which she describes as affecting her whole body. She is currently taking Skyrizi , which helps with her joint pain. She experiences muscle problems, particularly around her hips and back, and reports that her dermatologist said it is related to tightness from previous surgery. The pain is less severe than before. She has been prescribed meloxicam 15 mg for inflammation.  She was taken off statins and is now on Repatha , which she has been taking for a couple of months. She had her blood drawn last week in preparation for an upcoming visit with her cardiologist. No muscle aches from Repatha , although she does have joint aches.  No chest pain or shortness of breath during activity. No symptoms of excessive thirst or urination. She received a flu shot at [facility] but has not yet received a COVID vaccine.     Outpatient Medications Prior to Visit  Medication Sig Dispense Refill   amLODipine  (NORVASC ) 5 MG tablet TAKE 1 TABLET DAILY 90 tablet 3   aspirin  EC 81 MG tablet Take 1 tablet (81 mg total) by mouth daily. Swallow whole.     Cholecalciferol  (VITAMIN D3) 1.25 MG (50000 UT) CAPS Take 50,000 Units by mouth once a week.     clobetasol  (TEMOVATE ) 0.05 % external solution Apply 1 Application topically 2 (two) times daily. 50 mL 2   Continuous Glucose Sensor (FREESTYLE LIBRE 3 SENSOR) MISC 1 Act by Does not apply route daily. Place 1  sensor on the skin every 14 days. Use to check glucose continuously 2 each 5   Evolocumab  (REPATHA  SURECLICK) 140 MG/ML SOAJ Inject 140 mg into the skin every 14 (fourteen) days. 6 mL 3   metFORMIN  (GLUCOPHAGE -XR) 750 MG 24 hr tablet TAKE 1 TABLET DAILY WITH BREAKFAST 90 tablet 3   montelukast  (SINGULAIR ) 10 MG tablet TAKE 1 TABLET AT BEDTIME 90 tablet 1   nitroGLYCERIN  (NITROSTAT ) 0.4 MG SL tablet PLACE 1 TABLET UNDER THE TONGUE EVERY 5 MINS AS NEEDED FOR CHEST PAIN X 3 DOSES MAX IF SYMPTONS NOT RELIEVED THEN CALL 911 30 tablet 11   OZEMPIC , 0.25 OR 0.5 MG/DOSE, 2 MG/3ML SOPN INJECT 0.25MG  UNDER THE SKIN ONCE A WEEK 6 mL 0   risankizumab -rzaa (SKYRIZI  PEN) 150 MG/ML pen Inject 1 mL (150 mg total) into the skin as directed. Every 12 weeks for maintenance. 1 mL 11   olmesartan  (BENICAR ) 40 MG tablet TAKE 1 TABLET DAILY 90 tablet 1   BD PEN NEEDLE MICRO U/F 32G X 6 MM MISC USE 1 PEN NEEDLE ONCE DAILY 100 each 3   Continuous Glucose Receiver (FREESTYLE LIBRE 3 READER) DEVI 1 Act by Does not apply route daily. 1 each 3   Insulin  Pen Needle 32G X 6 MM MISC 1 Act by Does not apply route once a week. 100 each 1   metoprolol  succinate (TOPROL  XL) 25 MG 24 hr tablet Take 1 tablet (25 mg total) by mouth daily.  90 tablet 3   nabumetone  (RELAFEN ) 500 MG tablet TAKE 1 TABLET TWICE A DAY AS NEEDED 180 tablet 0   risankizumab -rzaa (SKYRIZI  PEN) 150 MG/ML pen Inject 1 mL (150 mg total) into the skin as directed. At weeks 4. 1 mL 0   No facility-administered medications prior to visit.    ROS Review of Systems  Constitutional:  Negative for appetite change, chills, diaphoresis, fatigue and fever.  HENT: Negative.    Eyes: Negative.   Respiratory: Negative.  Negative for cough, chest tightness, shortness of breath and wheezing.   Cardiovascular:  Negative for chest pain, palpitations and leg swelling.  Gastrointestinal: Negative.  Negative for abdominal pain, constipation, diarrhea, nausea and vomiting.   Endocrine: Negative.   Genitourinary: Negative.  Negative for difficulty urinating.  Musculoskeletal:  Positive for arthralgias, back pain and myalgias.  Skin: Negative.   Neurological: Negative.  Negative for dizziness, weakness and light-headedness.  Hematological:  Negative for adenopathy. Does not bruise/bleed easily.  Psychiatric/Behavioral: Negative.      Objective:  BP 136/76 (BP Location: Left Arm, Patient Position: Sitting, Cuff Size: Large)   Pulse 63   Temp 98.1 F (36.7 C) (Oral)   Resp 16   Ht 5' 6 (1.676 m)   Wt 259 lb 12.8 oz (117.8 kg)   SpO2 98%   BMI 41.93 kg/m   BP Readings from Last 3 Encounters:  09/09/24 136/76  03/13/24 134/78  03/06/24 (!) 150/80    Wt Readings from Last 3 Encounters:  09/09/24 259 lb 12.8 oz (117.8 kg)  03/13/24 264 lb 9.6 oz (120 kg)  12/17/23 258 lb (117 kg)    Physical Exam Vitals reviewed.  Constitutional:      Appearance: Normal appearance.  HENT:     Mouth/Throat:     Mouth: Mucous membranes are moist.  Eyes:     General: No scleral icterus.    Conjunctiva/sclera: Conjunctivae normal.  Cardiovascular:     Rate and Rhythm: Normal rate and regular rhythm.     Heart sounds: No murmur heard.    No friction rub. No gallop.  Pulmonary:     Effort: Pulmonary effort is normal.     Breath sounds: No stridor. No wheezing, rhonchi or rales.  Abdominal:     General: Abdomen is protuberant. There is no distension.     Palpations: There is no hepatomegaly, splenomegaly or mass.     Tenderness: There is no abdominal tenderness. There is no guarding.     Hernia: No hernia is present.  Musculoskeletal:        General: Deformity (DJD) present. No swelling or tenderness. Normal range of motion.     Cervical back: Neck supple.     Right lower leg: No edema.     Left lower leg: No edema.  Lymphadenopathy:     Cervical: No cervical adenopathy.  Skin:    General: Skin is warm and dry.  Neurological:     General: No focal  deficit present.     Mental Status: She is alert.     Lab Results  Component Value Date   WBC 7.5 09/09/2024   HGB 14.3 09/09/2024   HCT 42.1 09/09/2024   PLT 302.0 09/09/2024   GLUCOSE 148 (H) 09/09/2024   CHOL 153 09/03/2024   TRIG 131 09/03/2024   HDL 43 09/03/2024   LDLDIRECT 155.6 07/11/2012   LDLCALC 87 09/03/2024   ALT 15 09/09/2024   AST 15 09/09/2024   NA 135 09/09/2024  K 4.2 09/09/2024   CL 102 09/09/2024   CREATININE 0.90 09/09/2024   BUN 17 09/09/2024   CO2 24 09/09/2024   TSH 0.59 03/13/2024   INR 0.97 10/27/2011   HGBA1C 7.6 (H) 09/09/2024   MICROALBUR <0.7 03/13/2024    MR LUMBAR SPINE WO CONTRAST Result Date: 03/27/2024 CLINICAL DATA:  Low back pain.  Left lower extremity weakness. EXAM: MRI LUMBAR SPINE WITHOUT CONTRAST TECHNIQUE: Multiplanar, multisequence MR imaging of the lumbar spine was performed. No intravenous contrast was administered. COMPARISON:  CT abdomen and pelvis 06/07/2018 FINDINGS: Segmentation:  Standard. Alignment: Trace retrolisthesis of T11 on T12 and 4 mm retrolisthesis of L2 on L3, unchanged. Vertebrae: No fracture or suspicious marrow lesion. Scattered small Schmorl's nodes. Multilevel degenerative endplate changes, including mild degenerative edema at L2-3. Prior L3-4 posterior and interbody fusion with solid arthrodesis. Conus medullaris and cauda equina: Conus extends to the L1 level. Possible mild T2 hyperintensity in the spinal cord at T11-12, not included on axial sequences. Unremarkable appearance of the cauda equina. Paraspinal and other soft tissues: Postoperative changes in the posterior lumbar soft tissues. No fluid collection. Disc levels: Disc desiccation throughout the lumbar and included lower thoracic spine with associated disc space narrowing which is particularly severe at L2-3. T11-12: Only imaged sagittally. Disc bulging and facet hypertrophy including prominent right-sided facet spurring result in moderate spinal stenosis  with mild cord flattening and moderate right greater than left neural foraminal stenosis. T12-L1: Circumferential disc bulging and mild facet and ligamentum flavum hypertrophy without stenosis. L1-2: Circumferential disc bulging, a central disc extrusion with mild caudal migration, and moderate facet and ligamentum flavum hypertrophy result in borderline to mild spinal stenosis without neural foraminal stenosis. L2-3: Prior posterior decompression. Retrolisthesis, circumferential disc bulging, a central disc extrusion with mild caudal migration, and facet hypertrophy result in moderate bilateral lateral recess stenosis and mild-to-moderate bilateral neural foraminal stenosis without significant spinal stenosis. L3-4: Prior right-sided laminectomy, facetectomy, and fusion. Osteophytic ridging and left facet hypertrophy result in mild left lateral recess and left neural foraminal stenosis without significant spinal stenosis. L4-5: Prior posterior decompression. Disc bulging and moderate facet hypertrophy result in mild-to-moderate bilateral neural foraminal stenosis without spinal stenosis. L5-S1: Prior left laminectomy. Disc bulging eccentric to the left, endplate spurring, and moderate facet hypertrophy result in mild-to-moderate right and moderate left neural foraminal stenosis without spinal stenosis. IMPRESSION: 1. Prior posterior decompression from L2-3 through L5-S1 without significant residual spinal stenosis. Moderate lateral recess stenosis at L2-3 and up to moderate multilevel neural foraminal stenosis as above. 2. Moderate spinal stenosis and moderate neural foraminal stenosis at T11-12. Possible mild spinal cord edema or myelomalacia versus artifact. Electronically Signed   By: Dasie Hamburg M.D.   On: 03/27/2024 16:14    Assessment & Plan:   Insulin -requiring or dependent type II diabetes mellitus (HCC)- Blood sugar is adequately well controlled. -     Basic metabolic panel with GFR; Future -      Hemoglobin A1c; Future -     Urinalysis, Routine w reflex microscopic; Future -     COVID-19 mRNA Vac-TriS(Pfizer); Inject 0.3 mLs into the muscle once for 1 dose.  Dispense: 0.3 mL; Refill: 0  Essential hypertension, benign- BP is well controlled. -     Basic metabolic panel with GFR; Future -     CBC with Differential/Platelet; Future -     Hepatic function panel; Future -     Urinalysis, Routine w reflex microscopic; Future  Hyperlipidemia LDL goal <55 -  Hepatic function panel; Future -     CK; Future  Encounter for general adult medical examination with abnormal findings- Exam completed, labs reviewed, vaccines reviewed and updated, cancer screenings are UTD, pt ed material was given.   Primary osteoarthritis involving multiple joints -     Meloxicam; Take 1 tablet (15 mg total) by mouth daily.  Dispense: 90 tablet; Refill: 0     Follow-up: Return in about 6 months (around 03/10/2025).  Debby Molt, MD

## 2024-09-15 NOTE — Progress Notes (Unsigned)
 Cardiology Office Note:    Date:  09/16/2024   ID:  Brianna Lambert, Brianna Lambert Apr 09, 1956, MRN [id number]  PCP:  Joshua Debby CROME, MD  Cardiologist:  Lonni CROME Nanas, MD  Electrophysiologist:  None   Referring MD: Joshua Debby CROME, MD   Chief Complaint  Patient presents with   Coronary Artery Disease    History of Present Illness:    Brianna Lambert is a 68 y.o. female with a hx of CAD, T2DM, hypertension, hyperlipidemia, GERD who presents for follow-up.  She was referred by Dr. Joshua for evaluation of CAD, initially seen on 07/26/2023.  Echocardiogram 07/18/2023 showed normal biventricular function, no significant valvular disease.  Calcium  score on 07/24/2023 was 1195 (99th percentile).  She reports has been having chest pain.  Describes as chest tightness that occurs when she exerts herself, such as when she vacuums.  Has just started over the last month or so.  States that she will rest and it resolves.  She denies any dyspnea, lightheadedness, syncope, or lower extremity edema.  Reports rare palpitations.  No smoking history.  Family's includes mother had A-fib and father had AVR in his 30s.  LHC on 07/31/2023 showed 80% OM 2 stenosis (small caliber, not amenable to PCI), 40% mid LAD, 50% D1, 55% distal LCx, 15% proximal RCA.  Zio patch x 14 days 07/2023 showed 816 episodes of SVT with longest lasting 54 seconds, frequent PACs (7.5%), 1 episode of NSVT lasting 4 beats.  Since last clinic visit, she reports she is doing well.  Denies any chest pain, dyspnea, lightheadedness, syncope, lower extremity edema.  Reports has not been exercising.  Reports palpitations have improved.   Past Medical History:  Diagnosis Date   Allergy    Arthritis    knees and spine   Asthma    environmental allergies   Diabetes mellitus without complication (HCC)    GERD (gastroesophageal reflux disease)    Hiatal hernia    Hyperlipidemia    Hypertension    Low back pain radiating to both legs 2012    Dr. Normand   Lumbar pain with radiation down right leg    PONV (postoperative nausea and vomiting)     Past Surgical History:  Procedure Laterality Date   BACK SURGERY  1992   lumbar lam   CESAREAN SECTION  1986; 1989   CHOLECYSTECTOMY  2003   COLONOSCOPY     KNEE ARTHROSCOPY  2000   rt knee repair   LEFT HEART CATH AND CORONARY ANGIOGRAPHY N/A 07/31/2023   Procedure: LEFT HEART CATH AND CORONARY ANGIOGRAPHY;  Surgeon: Anner Alm ORN, MD;  Location: [facility] INVASIVE CV LAB;  Service: Cardiovascular;  Laterality: N/A;   POSTERIOR FUSION LUMBAR SPINE  11/02/11   right PLIF; L2-L5   TONSILLECTOMY  1970's   TUBAL LIGATION  1989    Current Medications: Current Meds  Medication Sig   amLODipine  (NORVASC ) 5 MG tablet TAKE 1 TABLET DAILY   aspirin  EC 81 MG tablet Take 1 tablet (81 mg total) by mouth daily. Swallow whole.   Cholecalciferol  (VITAMIN D3) 1.25 MG (50000 UT) CAPS Take 50,000 Units by mouth once a week.   clobetasol  (TEMOVATE ) 0.05 % external solution Apply 1 Application topically 2 (two) times daily.   Continuous Glucose Sensor (FREESTYLE LIBRE 3 SENSOR) MISC 1 Act by Does not apply route daily. Place 1 sensor on the skin every 14 days. Use to check glucose continuously   Evolocumab  (REPATHA  SURECLICK) 140 MG/ML SOAJ Inject 140  mg into the skin every 14 (fourteen) days.   ezetimibe  (ZETIA ) 10 MG tablet Take 1 tablet (10 mg total) by mouth daily.   meloxicam (MOBIC) 15 MG tablet Take 1 tablet (15 mg total) by mouth daily.   metFORMIN  (GLUCOPHAGE -XR) 750 MG 24 hr tablet TAKE 1 TABLET DAILY WITH BREAKFAST   metoprolol  succinate (TOPROL  XL) 25 MG 24 hr tablet Take 1 tablet (25 mg total) by mouth daily.   montelukast  (SINGULAIR ) 10 MG tablet TAKE 1 TABLET AT BEDTIME   nitroGLYCERIN  (NITROSTAT ) 0.4 MG SL tablet PLACE 1 TABLET UNDER THE TONGUE EVERY 5 MINS AS NEEDED FOR CHEST PAIN X 3 DOSES MAX IF SYMPTONS NOT RELIEVED THEN CALL 911   olmesartan  (BENICAR ) 40 MG tablet TAKE 1  TABLET DAILY   OZEMPIC , 0.25 OR 0.5 MG/DOSE, 2 MG/3ML SOPN INJECT 0.25MG  UNDER THE SKIN ONCE A WEEK   risankizumab -rzaa (SKYRIZI  PEN) 150 MG/ML pen Inject 1 mL (150 mg total) into the skin as directed. Every 12 weeks for maintenance.     Allergies:   Crestor  [rosuvastatin ], Codeine, and Pravastatin    Social History   Socioeconomic History   Marital status: Widowed    Spouse name: Not on file   Number of children: Not on file   Years of education: Not on file   Highest education level: Associate degree: occupational, scientist, product/process development, or vocational program  Occupational History   Not on file  Tobacco Use   Smoking status: Never   Smokeless tobacco: Never  Substance and Sexual Activity   Alcohol use: No    Alcohol/week: 0.0 standard drinks of alcohol   Drug use: No   Sexual activity: Never  Other Topics Concern   Not on file  Social History Narrative   Not on file   Social Drivers of Health   Financial Resource Strain: Low Risk  (09/08/2024)   Overall Financial Resource Strain (CARDIA)    Difficulty of Paying Living Expenses: Not hard at all  Food Insecurity: No Food Insecurity (09/08/2024)   Hunger Vital Sign    Worried About Running Out of Food in the Last Year: Never true    Ran Out of Food in the Last Year: Never true  Transportation Needs: No Transportation Needs (09/08/2024)   PRAPARE - Administrator, Civil Service (Medical): No    Lack of Transportation (Non-Medical): No  Physical Activity: Inactive (03/12/2024)   Exercise Vital Sign    Days of Exercise per Week: 0 days    Minutes of Exercise per Session: 30 min  Stress: No Stress Concern Present (03/12/2024)   [employer] of Occupational Health - Occupational Stress Questionnaire    Feeling of Stress : Not at all  Social Connections: Moderately Integrated (09/08/2024)   Social Connection and Isolation Panel    Frequency of Communication with Friends and Family: More than three times a week     Frequency of Social Gatherings with Friends and Family: More than three times a week    Attends Religious Services: More than 4 times per year    Active Member of [employer] or Organizations: Yes    Attends Banker Meetings: More than 4 times per year    Marital Status: Widowed     Family History: The patient's family history includes Diabetes in her father; Heart disease in her father. There is no history of Cancer, Anesthesia problems, Hypotension, Malignant hyperthermia, Pseudochol deficiency, Colon cancer, Esophageal cancer, Stomach cancer, or Rectal cancer.  ROS:   Please see  the history of present illness.     All other systems reviewed and are negative.  EKGs/Labs/Other Studies Reviewed:    The following studies were reviewed today:   EKG:   07/26/2023: Normal sinus rhythm, rate 63, no ST abnormalities 12/17/2023: Sinus bradycardia, rate 59 09/16/2024: Sinus rhythm with PACs, rate 62, low voltage, Q waves in leads III, aVF, poor R wave progression  Recent Labs: 03/13/2024: TSH 0.59 09/09/2024: ALT 15; BUN 17; Creatinine, Ser 0.90; Hemoglobin 14.3; Platelets 302.0; Potassium 4.2; Sodium 135  Recent Lipid Panel    Component Value Date/Time   CHOL 153 09/03/2024 0811   TRIG 131 09/03/2024 0811   HDL 43 09/03/2024 0811   CHOLHDL 3.6 09/03/2024 0811   CHOLHDL 6 06/25/2023 1611   VLDL 35.8 06/25/2023 1611   LDLCALC 87 09/03/2024 0811   LDLDIRECT 155.6 07/11/2012 0911    Physical Exam:    VS:  BP (!) 140/64 (BP Location: Left Arm, Patient Position: Sitting, Cuff Size: Large)   Pulse 62   Ht 5' 7 (1.702 m)   Wt 264 lb (119.7 kg)   SpO2 96%   BMI 41.35 kg/m     Wt Readings from Last 3 Encounters:  09/16/24 264 lb (119.7 kg)  09/09/24 259 lb 12.8 oz (117.8 kg)  03/13/24 264 lb 9.6 oz (120 kg)     GEN:  Well nourished, well developed in no acute distress HEENT: Normal NECK: No JVD; No carotid bruits LYMPHATICS: No lymphadenopathy CARDIAC: RRR, no murmurs,  rubs, gallops RESPIRATORY:  Clear to auscultation without rales, wheezing or rhonchi  ABDOMEN: Soft, non-tender, non-distended MUSCULOSKELETAL:  No edema; No deformity  SKIN: Warm and dry NEUROLOGIC:  Alert and oriented x 3 PSYCHIATRIC:  Normal affect   ASSESSMENT:    1. Coronary artery disease of native artery of native heart with stable angina pectoris   2. Palpitations   3. Essential hypertension, benign   4. Hyperlipidemia LDL goal <55   5. Morbid obesity (HCC)   6. Daytime somnolence       PLAN:    CAD: Calcium  score on 07/24/2023 was 1195 (99th percentile).  Echocardiogram 07/18/2023 showed normal biventricular function, no significant valvular disease.  She reported exertional chest pain concerning for typical angina.  LHC on 07/31/2023 showed 80% OM 2 stenosis (small caliber, not amenable to PCI), 40% mid LAD, 50% D1, 55% distal LCx, 15% proximal RCA. -Continue ASA, statin -Sublingual nitroglycerin  as needed -Medical management recommended for OM disease, continue amlodipine  5 mg daily and Toprol -XL 25 mg daily  SVT: Zio patch x 14 days 07/2023 showed 816 episodes of SVT with longest lasting 54 seconds, frequent PACs (7.5%), 1 episode of NSVT lasting 4 beats. - Continue Toprol -XL 25 mg daily, she reports improvement in palpitations  Hypertension: On olmesartan  40 mg daily and amlodipine  5 mg daily.  Appears controlled  T2DM: On insulin  and Ozempic .  A1c 7.6% on 08/2024  Hyperlipidemia: On atorvastatin  40 mg daily, LDL 181 on 06/25/2023.  Nexlizet  180-10 mg daily was added at that time.  LDL 97 on 09/21/2023.  Atorvastatin  increased to 80 mg daily. -She reports significant joint pains on atorvastatin .  Discontinued statin and referred to pharmacy lipid clinic, she was started on Repatha .  LDL 87 on 09/03/2024.  Will add Zetia  10 mg daily  Daytime somnolence: Concern for OSA, check Itamar sleep study.  STOP-BANG 4. - Itamar sleep study given to patient at prior clinic appointment,  reports she has not used.  Will discuss with  sleep coordinator  Morbid obesity: Body mass index is 41.35 kg/m.  Will refer to healthy weight and wellness  RTC in 6 months   Medication Adjustments/Labs and Tests Ordered: Current medicines are reviewed at length with the patient today.  Concerns regarding medicines are outlined above.  Orders Placed This Encounter  Procedures   Lipid Profile   Amb Ref to Medical Weight Management   EKG 12-Lead   Meds ordered this encounter  Medications   metoprolol  succinate (TOPROL  XL) 25 MG 24 hr tablet    Sig: Take 1 tablet (25 mg total) by mouth daily.    Dispense:  90 tablet    Refill:  3   ezetimibe  (ZETIA ) 10 MG tablet    Sig: Take 1 tablet (10 mg total) by mouth daily.    Dispense:  90 tablet    Refill:  3    Patient Instructions  Medication Instructions:  Your physician has recommended you make the following change in your medication:  Resume metoprolol  succinate 25 mg by mouth daily Start Ezetimibe  10 mg by mouth daily   *If you need a refill on your cardiac medications before your next appointment, please call your pharmacy*  Lab Work: Have fasting lab work checked in 3 months--Lipid profile.  Can be done at any [facility] location. There is a [facility] on the first floor of our building If you have labs (blood work) drawn today and your tests are completely normal, you will receive your results only by: MyChart Message (if you have MyChart) OR A paper copy in the mail If you have any lab test that is abnormal or we need to change your treatment, we will call you to review the results.  Testing/Procedures: none  Follow-Up: At [facility], you and your health needs are our priority.  As part of our continuing mission to provide you with exceptional heart care, our providers are all part of one team.  This team includes your primary Cardiologist (physician) and Advanced Practice Providers or APPs (Physician Assistants and  Nurse Practitioners) who all work together to provide you with the care you need, when you need it.  Your next appointment:   6 month(s)  Provider:   Lonni LITTIE Nanas, MD    We recommend signing up for the patient portal called MyChart.  Sign up information is provided on this After Visit Summary.  MyChart is used to connect with patients for Virtual Visits (Telemedicine).  Patients are able to view lab/test results, encounter notes, upcoming appointments, etc.  Non-urgent messages can be sent to your provider as well.   To learn more about what you can do with MyChart, go to [email or web address].   Other Instructions You have been referred to Healthy weight and wellness           Signed, Lonni LITTIE Nanas, MD  09/16/2024 5:07 PM    [facility]

## 2024-09-16 ENCOUNTER — Encounter: Payer: Self-pay | Admitting: Cardiology

## 2024-09-16 ENCOUNTER — Other Ambulatory Visit: Payer: Self-pay | Admitting: *Deleted

## 2024-09-16 ENCOUNTER — Ambulatory Visit: Attending: Cardiology | Admitting: Cardiology

## 2024-09-16 VITALS — BP 140/64 | HR 62 | Ht 67.0 in | Wt 264.0 lb

## 2024-09-16 DIAGNOSIS — I25118 Atherosclerotic heart disease of native coronary artery with other forms of angina pectoris: Secondary | ICD-10-CM

## 2024-09-16 DIAGNOSIS — E785 Hyperlipidemia, unspecified: Secondary | ICD-10-CM

## 2024-09-16 DIAGNOSIS — R002 Palpitations: Secondary | ICD-10-CM

## 2024-09-16 DIAGNOSIS — I10 Essential (primary) hypertension: Secondary | ICD-10-CM | POA: Diagnosis not present

## 2024-09-16 DIAGNOSIS — R400 Somnolence: Secondary | ICD-10-CM

## 2024-09-16 MED ORDER — METOPROLOL SUCCINATE ER 25 MG PO TB24: 25.0000 mg | ORAL_TABLET | Freq: Every day | ORAL | 3 refills | Status: AC

## 2024-09-16 MED ORDER — EZETIMIBE 10 MG PO TABS: 10.0000 mg | ORAL_TABLET | Freq: Every day | ORAL | 3 refills | Status: AC

## 2024-09-16 NOTE — Patient Instructions (Signed)
 Medication Instructions:  Your physician has recommended you make the following change in your medication:  Resume metoprolol  succinate 25 mg by mouth daily Start Ezetimibe  10 mg by mouth daily   *If you need a refill on your cardiac medications before your next appointment, please call your pharmacy*  Lab Work: Have fasting lab work checked in 3 months--Lipid profile.  Can be done at any [facility] location. There is a [facility] on the first floor of our building If you have labs (blood work) drawn today and your tests are completely normal, you will receive your results only by: MyChart Message (if you have MyChart) OR A paper copy in the mail If you have any lab test that is abnormal or we need to change your treatment, we will call you to review the results.  Testing/Procedures: none  Follow-Up: At [facility], you and your health needs are our priority.  As part of our continuing mission to provide you with exceptional heart care, our providers are all part of one team.  This team includes your primary Cardiologist (physician) and Advanced Practice Providers or APPs (Physician Assistants and Nurse Practitioners) who all work together to provide you with the care you need, when you need it.  Your next appointment:   6 month(s)  Provider:   Lonni LITTIE Nanas, MD    We recommend signing up for the patient portal called MyChart.  Sign up information is provided on this After Visit Summary.  MyChart is used to connect with patients for Virtual Visits (Telemedicine).  Patients are able to view lab/test results, encounter notes, upcoming appointments, etc.  Non-urgent messages can be sent to your provider as well.   To learn more about what you can do with MyChart, go to [email or web address].   Other Instructions You have been referred to Healthy weight and wellness

## 2024-09-17 ENCOUNTER — Other Ambulatory Visit: Payer: Self-pay | Admitting: Cardiology

## 2024-09-18 ENCOUNTER — Other Ambulatory Visit: Payer: Self-pay | Admitting: Internal Medicine

## 2024-09-18 DIAGNOSIS — J45998 Other asthma: Secondary | ICD-10-CM

## 2024-09-22 ENCOUNTER — Telehealth: Payer: Self-pay

## 2024-09-22 DIAGNOSIS — I25118 Atherosclerotic heart disease of native coronary artery with other forms of angina pectoris: Secondary | ICD-10-CM

## 2024-09-22 DIAGNOSIS — R002 Palpitations: Secondary | ICD-10-CM

## 2024-09-22 DIAGNOSIS — I10 Essential (primary) hypertension: Secondary | ICD-10-CM

## 2024-09-22 DIAGNOSIS — R400 Somnolence: Secondary | ICD-10-CM

## 2024-09-22 NOTE — Telephone Encounter (Signed)
**Note De-Identified Abdulkadir Emmanuel Obfuscation** Ordering provider: Dr Kate Associated diagnoses: Somnolence-R40.0 and Palpitations-R00.2  WatchPAT PA obtained on 09/22/2024 by Mry Lamia, Avelina HERO, LPN. Authorization: Per the [employer] provider portal, a PA is not required for CPTCode 04199 (Itamar-HST).  Patient notified of PIN (1234) on 09/22/2024 Kasee Hantz Notification Method: phone. No answer so I left a message on her VM (ok Per DPR) advising her of the WatchPAT One-HST Device PIN # of [id number]. I did apologize for the delay in getting the PIN # to her and I did leave my name and the office phone number in the message so she can call me back if shre has any questions or concerns.  Phone note routed to covering staff for follow-up.

## 2024-09-22 NOTE — Telephone Encounter (Signed)
**Note De-identified Maisen Schmit Obfuscation**  **Note De-Identified Swara Donze Obfuscation** PA for Itamar-HST (CPT Code: 04199) not required per [employer].

## 2024-09-29 ENCOUNTER — Encounter: Payer: Self-pay | Admitting: Bariatrics

## 2024-09-29 ENCOUNTER — Ambulatory Visit: Admitting: Bariatrics

## 2024-09-29 VITALS — BP 144/79 | HR 53 | Ht 64.5 in | Wt 257.0 lb

## 2024-09-29 DIAGNOSIS — E1165 Type 2 diabetes mellitus with hyperglycemia: Secondary | ICD-10-CM

## 2024-09-29 DIAGNOSIS — E785 Hyperlipidemia, unspecified: Secondary | ICD-10-CM

## 2024-09-29 DIAGNOSIS — E1169 Type 2 diabetes mellitus with other specified complication: Secondary | ICD-10-CM

## 2024-09-29 DIAGNOSIS — Z7985 Long-term (current) use of injectable non-insulin antidiabetic drugs: Secondary | ICD-10-CM

## 2024-09-29 DIAGNOSIS — Z6841 Body Mass Index (BMI) 40.0 and over, adult: Secondary | ICD-10-CM | POA: Diagnosis not present

## 2024-09-29 DIAGNOSIS — E119 Type 2 diabetes mellitus without complications: Secondary | ICD-10-CM | POA: Diagnosis not present

## 2024-09-29 NOTE — Progress Notes (Signed)
 Office: [phone]  /  Fax: [phone]   Initial Visit  Brianna Lambert was seen in clinic today to evaluate for obesity. She is interested in losing weight to improve overall health and reduce the risk of weight related complications. She presents today to review program treatment options, initial physical assessment, and evaluation.     She was referred by: PCP  When asked what else they would like to accomplish? She states: Adopt a healthier eating pattern and lifestyle, Improve energy levels and physical activity, Improve existing medical conditions, Improve quality of life, and Improve appearance  When asked how has your weight affected you? She states: Contributed to medical problems, Having fatigue, Having poor endurance, and Problems with eating patterns  Some associated conditions: Hypertension, Hyperlipidemia, Diabetes, and Vitamin D  Deficiency  Contributing factors: family history of obesity, reduced physical activity, chronic skipping of meals, menopause, slow metabolism for age, and sedentary job  Weight promoting medications identified: None  Current nutrition plan: Portion control / smart choices and Other: Weight Watchers  Current level of physical activity: Limited due to chronic pain or orthopedic problems  Current or previous pharmacotherapy: GLP-1 and Metformin   Response to medication: Never tried medications   Past medical history includes:   Past Medical History:  Diagnosis Date   Allergy    Arthritis    knees and spine   Asthma    environmental allergies   Diabetes mellitus without complication (HCC)    GERD (gastroesophageal reflux disease)    Hiatal hernia    Hyperlipidemia    Hypertension    Low back pain radiating to both legs 2012   Dr. Normand   Lumbar pain with radiation down right leg    PONV (postoperative nausea and vomiting)      Objective:   BP (!) 144/79   Pulse (!) 53   Ht 5' 4.5 (1.638 m)   Wt 257 lb (116.6 kg)    SpO2 97%   BMI 43.43 kg/m  She was weighed on the bioimpedance scale: Body mass index is 43.43 kg/m.  Peak Weight:270 lbs  , Body Fat%:53.8 %, Visceral Fat Rating:19, Weight trend over the last 12 months: Unchanged  General:  Alert, oriented and cooperative. Patient is in no acute distress.  Respiratory: Normal respiratory effort, no problems with respiration noted  Extremities: Normal range of motion.    Mental Status: Normal mood and affect. Normal behavior. Normal judgment and thought content.   DIAGNOSTIC DATA REVIEWED:  BMET    Component Value Date/Time   NA 135 09/09/2024 0939   NA 139 03/03/2024 1326   K 4.2 09/09/2024 0939   CL 102 09/09/2024 0939   CO2 24 09/09/2024 0939   GLUCOSE 148 (H) 09/09/2024 0939   BUN 17 09/09/2024 0939   BUN 21 03/03/2024 1326   CREATININE 0.90 09/09/2024 0939   CALCIUM  9.1 09/09/2024 0939   GFRNONAA >90 10/27/2011 1539   GFRAA >90 10/27/2011 1539   Lab Results  Component Value Date   HGBA1C 7.6 (H) 09/09/2024   HGBA1C 7.8 (H) 08/01/2012   No results found for: INSULIN  CBC    Component Value Date/Time   WBC 7.5 09/09/2024 0939   RBC 4.71 09/09/2024 0939   HGB 14.3 09/09/2024 0939   HGB 13.3 03/03/2024 1326   HCT 42.1 09/09/2024 0939   HCT 39.6 03/03/2024 1326   PLT 302.0 09/09/2024 0939   PLT 301 03/03/2024 1326   MCV 89.4 09/09/2024 0939   MCV 93 03/03/2024 1326  MCH 31.1 03/03/2024 1326   MCH 30.4 10/27/2011 1539   MCHC 34.0 09/09/2024 0939   RDW 13.5 09/09/2024 0939   RDW 12.4 03/03/2024 1326   Iron/TIBC/Ferritin/ %Sat No results found for: IRON, TIBC, FERRITIN, IRONPCTSAT Lipid Panel     Component Value Date/Time   CHOL 153 09/03/2024 0811   TRIG 131 09/03/2024 0811   HDL 43 09/03/2024 0811   CHOLHDL 3.6 09/03/2024 0811   CHOLHDL 6 06/25/2023 1611   VLDL 35.8 06/25/2023 1611   LDLCALC 87 09/03/2024 0811   LDLDIRECT 155.6 07/11/2012 0911   Hepatic Function Panel     Component Value Date/Time    PROT 7.0 09/09/2024 0939   PROT 6.4 03/03/2024 1326   ALBUMIN 4.1 09/09/2024 0939   ALBUMIN 4.0 03/03/2024 1326   AST 15 09/09/2024 0939   ALT 15 09/09/2024 0939   ALKPHOS 52 09/09/2024 0939   BILITOT 0.5 09/09/2024 0939   BILITOT 0.2 03/03/2024 1326   BILIDIR 0.1 09/09/2024 0939      Component Value Date/Time   TSH 0.59 03/13/2024 0923     Assessment and Plan:   Type II Diabetes HgbA1c is not at goal. Last A1c was 7.6 Episodes of hypoglycemia: no Medication(s): Ozempic  0.25 mg SQ weekly and Metformin  750 mg daily.   Lab Results  Component Value Date   HGBA1C 7.6 (H) 09/09/2024   HGBA1C 8.2 (H) 03/13/2024   HGBA1C 7.8 (H) 06/25/2023   Lab Results  Component Value Date   MICROALBUR <0.7 03/13/2024   LDLCALC 87 09/03/2024   CREATININE 0.90 09/09/2024   Lab Results  Component Value Date   GFR 65.38 09/09/2024   GFR 75.57 03/13/2024   GFR 65.94 06/25/2023    Plan: Continue Ozempic  0.25 mg SQ weekly and Metformin   Will keep all carbohydrates low both sweets and starches.  Will continue exercise regimen to 30 to 60 minutes on most days of the week.  Aim for 7 to 9 hours of sleep nightly.  Eat more low glycemic index foods.   Hyperlipidemia:   She states that she has a high LP-a and very high cholesterol. She is taking Repatha .   Plan:   Will continue Repatha .  Will begin the plan and exercise.    Morbid Obesity: Current BMI 43.43    Obesity Treatment / Action Plan:  Patient will work on garnering support from family and friends to begin weight loss journey. Will work on eliminating or reducing the presence of highly palatable, calorie dense foods in the home. Will complete provided nutritional and psychosocial assessment questionnaire before the next appointment. Will be scheduled for indirect calorimetry to determine resting energy expenditure in a fasting state.  This will allow us  to create a reduced calorie, high-protein meal plan to promote loss of fat  mass while preserving muscle mass. Counseled on the health benefits of losing 5%-15% of total body weight. Was counseled on nutritional approaches to weight loss and benefits of reducing processed foods and consuming plant-based foods and high quality protein as part of nutritional weight management. Was counseled on pharmacotherapy and role as an adjunct in weight management.   Obesity Education Performed Today:  She was weighed on the bioimpedance scale and results were discussed and documented in the synopsis.  We discussed obesity as a disease and the importance of a more detailed evaluation of all the factors contributing to the disease.  We discussed the importance of long term lifestyle changes which include nutrition, exercise and behavioral modifications as well  as the importance of customizing this to her specific health and social needs.  We discussed the benefits of reaching a healthier weight to alleviate the symptoms of existing conditions and reduce the risks of the biomechanical, metabolic and psychological effects of obesity.  Discussed New Patient/Late Arrival, and Cancellation Policies. Patient voiced understanding and allowed to ask questions.   Brianna Lambert appears to be in the action stage of change and states they are ready to start intensive lifestyle modifications and behavioral modifications.  30 minutes was spent today on this visit including the above counseling, pre-visit chart review, and post-visit documentation.  Reviewed by clinician on day of visit: allergies, medications, problem list, medical history, surgical history, family history, social history, and previous encounter notes.    Domenik Trice A. Delores CORDOBAO.

## 2024-09-30 ENCOUNTER — Ambulatory Visit (INDEPENDENT_AMBULATORY_CARE_PROVIDER_SITE_OTHER): Admitting: Bariatrics

## 2024-09-30 ENCOUNTER — Encounter: Payer: Self-pay | Admitting: Bariatrics

## 2024-09-30 VITALS — BP 129/71 | HR 53 | Temp 97.7°F | Ht 64.5 in | Wt 258.0 lb

## 2024-09-30 DIAGNOSIS — R931 Abnormal findings on diagnostic imaging of heart and coronary circulation: Secondary | ICD-10-CM

## 2024-09-30 DIAGNOSIS — R5383 Other fatigue: Secondary | ICD-10-CM

## 2024-09-30 DIAGNOSIS — E7841 Elevated Lipoprotein(a): Secondary | ICD-10-CM | POA: Diagnosis not present

## 2024-09-30 DIAGNOSIS — E559 Vitamin D deficiency, unspecified: Secondary | ICD-10-CM

## 2024-09-30 DIAGNOSIS — Z6841 Body Mass Index (BMI) 40.0 and over, adult: Secondary | ICD-10-CM

## 2024-09-30 DIAGNOSIS — E119 Type 2 diabetes mellitus without complications: Secondary | ICD-10-CM | POA: Diagnosis not present

## 2024-09-30 DIAGNOSIS — Z7985 Long-term (current) use of injectable non-insulin antidiabetic drugs: Secondary | ICD-10-CM

## 2024-09-30 DIAGNOSIS — R0602 Shortness of breath: Secondary | ICD-10-CM | POA: Diagnosis not present

## 2024-09-30 DIAGNOSIS — Z1331 Encounter for screening for depression: Secondary | ICD-10-CM | POA: Diagnosis not present

## 2024-09-30 DIAGNOSIS — E538 Deficiency of other specified B group vitamins: Secondary | ICD-10-CM

## 2024-09-30 DIAGNOSIS — E66813 Obesity, class 3: Secondary | ICD-10-CM

## 2024-09-30 DIAGNOSIS — E1165 Type 2 diabetes mellitus with hyperglycemia: Secondary | ICD-10-CM

## 2024-09-30 DIAGNOSIS — Z7984 Long term (current) use of oral hypoglycemic drugs: Secondary | ICD-10-CM

## 2024-09-30 NOTE — Progress Notes (Signed)
 At a Glance:  Vitals Temp: 97.7 F (36.5 C) BP: 129/71 Pulse Rate: (!) 53 SpO2: 97 %   Anthropometric Measurements Height: 5' 4.5 (1.638 m) Weight: 258 lb (117 kg) BMI (Calculated): 43.62 Starting Weight: 258lb Peak Weight: 270lb   Body Composition  Body Fat %: 52.9 % Fat Mass (lbs): 137 lbs Muscle Mass (lbs): 115.6 lbs Total Body Water (lbs): 93.8 lbs Visceral Fat Rating : 19   Other Clinical Data Fasting: yes Labs: yes Today's Visit #: 1 Starting Date: 09/30/24    EKG: done elsewhere on 09/16/24.   Indirect Calorimeter:   Resting Metabolic Rate ( RMR):  RMR (actual): 1886 kcal RMR (calculated): 1756 kcal The calculated basal metabolic rate is 8243 thus her basal metabolic rate is better than expected.  Plan:   Indirect calorimeter completed, interpreted and reviewed with patient today and allowed to ask questions.  Discussed the implications for the chosen plan and exercise based on the RMR reading.  Will consider repeating the RMR in the future based on weight loss.    Chief Complaint:  Obesity   Subjective:  Brianna Lambert (MR# [id number]) is a 68 y.o. female who presents for evaluation and treatment of obesity and related comorbidities.   Brianna Lambert is currently in the action stage of change and ready to dedicate time achieving and maintaining a healthier weight. Brianna Lambert is interested in becoming our patient and working on intensive lifestyle modifications including (but not limited to) diet and exercise for weight loss.  Brianna Lambert has been struggling with her weight. She has been unsuccessful in either losing weight, maintaining weight loss, or reaching her healthy weight goal.  Brianna Lambert's habits were reviewed today and are as follows: Her family eats meals together, she thinks her family will eat healthier with her, and she skips meals frequently.  Current or previous pharmacotherapy: GLP-1 and Metformin  (for diabetes)  Response to medication: Never  tried medications specifically for weight loss.  Other Fatigue Brianna Lambert admits to daytime somnolence and admits to waking up still tired. Patient has a history of symptoms of daytime fatigue. Brianna Lambert generally gets 5 or 6 hours of sleep per night, and states that she has states that she does not require a lot of sleep. Snoring may be present. Apneic episodes are not present. Epworth Sleepiness Score is 5.   Shortness of Breath Brianna Lambert notes increasing shortness of breath with exercising and seems to be worsening over time with weight gain. She notes getting out of breath sooner with activity than she used to. This has not gotten worse recently. Brianna Lambert denies shortness of breath at rest or orthopnea.  Depression Screen Brianna Lambert's Food and Mood (modified PHQ-9) score was 20. 20-27 severe depression     09/30/2024    7:09 AM  Depression screen PHQ 2/9  Decreased Interest 0  Down, Depressed, Hopeless 0  PHQ - 2 Score 0  Altered sleeping 1  Tired, decreased energy 2  Change in appetite 2  Feeling bad or failure about yourself  0  Trouble concentrating 0  Moving slowly or fidgety/restless 0  Suicidal thoughts 0  PHQ-9 Score 5  Difficult doing work/chores Not difficult at all     Assessment and Plan:   Other Fatigue Brianna Lambert does feel that her weight is causing her energy to be lower than it should be. Fatigue may be related to obesity, depression or many other causes. Labs will be ordered, and in the meanwhile, Brianna Lambert will focus on self care including making healthy food  choices, increasing physical activity and focusing on stress reduction.  Shortness of Breath Brianna Lambert does feel that she gets out of breath more easily that she used to when she exercises. Brianna Lambert's shortness of breath appears to be obesity related and exercise induced. She has agreed to work on weight loss and gradually increase exercise to treat her exercise induced shortness of breath. Will continue to monitor closely.  Health Maintenance:    Obesity   Plan: Will do indirect calorimetry, and labs.     Vitamin D  Deficiency Vitamin D  is not at goal of 50.  Most recent vitamin D  level was 41.99 (older reading). . She is at risk for vitamin D  deficiency due to obesity.  She is not on any vitamin D .  Lab Results  Component Value Date   VD25OH 41.99 10/24/2022   VD25OH 52.33 09/15/2021   VD25OH 33.87 08/23/2020    Plan: Will check for vitamin D  deficiency.   Type II Diabetes HgbA1c is not at goal. Last A1c was 7.6 CBGs: Not checking      Episodes of hypoglycemia: no Medication(s): Ozempic  0.25 mg SQ weekly  (for 6 months)and Metformin  XR 750 mg  Lab Results  Component Value Date   HGBA1C 7.6 (H) 09/09/2024   HGBA1C 8.2 (H) 03/13/2024   HGBA1C 7.8 (H) 06/25/2023   Lab Results  Component Value Date   MICROALBUR <0.7 03/13/2024   LDLCALC 87 09/03/2024   CREATININE 0.90 09/09/2024   Lab Results  Component Value Date   GFR 65.38 09/09/2024   GFR 75.57 03/13/2024   GFR 65.94 06/25/2023    Plan: Continue Ozempic  0.25 mg SQ weekly and Metformin  XR 750 mg daily.  Will keep all carbohydrates low both sweets and starches.  Will continue exercise regimen to 30 to 60 minutes on most days of the week.  Aim for 7 to 9 hours of sleep nightly.  Eat more low glycemic index foods.   High serum lipoprotein (a):   She is on Repatha  and Zetia . She is intolerant to statins.   Plan:  Continue medications.   Agatston CAC, greater than 400:  She is a diabetic and her high Agatston score along with the high serum lipoprotein (a) put her at high risk for cardiovascular disease.   Plan:  Will continue medications. Will begin the plan and exercise.    B 12 deficiency:   She not taking B 12. She has risk factors for B 12 deficiency.   Plan:  Check B 12 lab today.    Brianna Lambert had a positive depression screening. Depression is commonly associated with obesity and often results in emotional eating behaviors. We will  monitor this closely and work on CBT to help improve the non-hunger eating patterns. Referral to Psychology may be required if no improvement is seen as she continues in our clinic.    Previous labs reviewed today. Date: 09/09/24, 09/03/24 CMP, HgbA1c, and glucose, CBC, lipids  Labs done today CMP, Insulin , Vit D, and Vit B12   Morbid Obesity: BMI (Calculated): 43.62   Saul is currently in the action stage of change and her goal is to begin weight loss efforts. I recommend Yemaya begin the structured treatment plan as follows:  She has agreed to Category 2 Plan  Exercise goals: Older adults should determine their level of effort for physical activity relative to their level of fitness.  Behavioral modification strategies:increasing lean protein intake, decreasing simple carbohydrates, increasing vegetables, increase H2O intake, decreasing sodium intake, no skipping meals,  meal planning and cooking strategies, keeping healthy foods in the home, better snacking choices, and planning for success  She was informed of the importance of frequent follow-up visits to maximize her success with intensive lifestyle modifications for her multiple health conditions. She was informed we would discuss her lab results at her next visit unless there is a critical issue that needs to be addressed sooner. Tabathia agreed to keep her next visit at the agreed upon time to discuss these results.  Objective:  General: Cooperative, alert, well developed, in no acute distress. HEENT: Conjunctivae and lids unremarkable. Cardiovascular: Regular rhythm.  Lungs: Normal work of breathing. Neurologic: No focal deficits.   Lab Results  Component Value Date   CREATININE 0.90 09/09/2024   BUN 17 09/09/2024   NA 135 09/09/2024   K 4.2 09/09/2024   CL 102 09/09/2024   CO2 24 09/09/2024   Lab Results  Component Value Date   ALT 15 09/09/2024   AST 15 09/09/2024   ALKPHOS 52 09/09/2024   BILITOT 0.5 09/09/2024    Lab Results  Component Value Date   HGBA1C 7.6 (H) 09/09/2024   HGBA1C 8.2 (H) 03/13/2024   HGBA1C 7.8 (H) 06/25/2023   HGBA1C 6.6 (H) 10/24/2022   HGBA1C 6.7 (H) 03/21/2022   No results found for: INSULIN  Lab Results  Component Value Date   TSH 0.59 03/13/2024   Lab Results  Component Value Date   CHOL 153 09/03/2024   HDL 43 09/03/2024   LDLCALC 87 09/03/2024   LDLDIRECT 155.6 07/11/2012   TRIG 131 09/03/2024   CHOLHDL 3.6 09/03/2024   Lab Results  Component Value Date   WBC 7.5 09/09/2024   HGB 14.3 09/09/2024   HCT 42.1 09/09/2024   MCV 89.4 09/09/2024   PLT 302.0 09/09/2024   No results found for: IRON, TIBC, FERRITIN  Attestation Statements:  Applicable history such as the following:  allergies, medications, problem list, medical history, surgical history, family history, social history, and previous encounter notes reviewed by clinician on day of visit:  Time spent on visit in care of the patient today including the items listed below was 55 minutes.    20 minutes were spent talking about the history, 30 minutes for face to face counseling implementing the plan, discussing the specifics of how to arrange meals, meal planning, water intake.   I spent face to face time discussing his/her plan, including breakfast, additional breakfast options, lunch, and dinner options, grocery list, and snacks.  I reviewed her indirect calorimetry. I discussed the implications for the diet plan.    Discussed the bio-impedence test (fat %, muscle mass, and water weight) and allowed the patient to ask questions.   Discussed the following information sheets: Category 2 + 100 calories.    I reviewed the labs which were ordered from her visit on 09/09/24.   I additionally spent time documenting, reviewing, and checking the codes before submitting.   This may have been prepared with the assistance of Engineer, Civil (consulting).  Occasional wrong-word or sound-a-like  substitutions may have occurred due to the inherent limitations of voice recognition software.    Clayborne Daring, DO

## 2024-10-01 ENCOUNTER — Encounter: Payer: Self-pay | Admitting: Bariatrics

## 2024-10-01 DIAGNOSIS — E88819 Insulin resistance, unspecified: Secondary | ICD-10-CM | POA: Insufficient documentation

## 2024-10-01 LAB — VITAMIN D 25 HYDROXY (VIT D DEFICIENCY, FRACTURES): Vit D, 25-Hydroxy: 30.4 ng/mL (ref 30.0–100.0)

## 2024-10-01 LAB — COMPREHENSIVE METABOLIC PANEL WITH GFR
ALT: 14 IU/L (ref 0–32)
AST: 15 IU/L (ref 0–40)
Albumin: 4.2 g/dL (ref 3.9–4.9)
Alkaline Phosphatase: 61 IU/L (ref 49–135)
BUN/Creatinine Ratio: 18 (ref 12–28)
BUN: 16 mg/dL (ref 8–27)
Bilirubin Total: 0.4 mg/dL (ref 0.0–1.2)
CO2: 22 mmol/L (ref 20–29)
Calcium: 9.3 mg/dL (ref 8.7–10.3)
Chloride: 101 mmol/L (ref 96–106)
Creatinine, Ser: 0.87 mg/dL (ref 0.57–1.00)
Globulin, Total: 2.4 g/dL (ref 1.5–4.5)
Glucose: 169 mg/dL — ABNORMAL HIGH (ref 70–99)
Potassium: 4.7 mmol/L (ref 3.5–5.2)
Sodium: 137 mmol/L (ref 134–144)
Total Protein: 6.6 g/dL (ref 6.0–8.5)
eGFR: 72 mL/min/1.73 (ref >59)

## 2024-10-01 LAB — VITAMIN B12: Vitamin B-12: 1720 pg/mL — ABNORMAL HIGH (ref 232–1245)

## 2024-10-01 LAB — INSULIN, RANDOM: INSULIN: 47.8 u[IU]/mL — ABNORMAL HIGH (ref 2.6–24.9)

## 2024-10-02 ENCOUNTER — Encounter (HOSPITAL_BASED_OUTPATIENT_CLINIC_OR_DEPARTMENT_OTHER): Payer: Self-pay | Admitting: Pharmacist Clinician (PhC)/ Clinical Pharmacy Specialist

## 2024-10-06 ENCOUNTER — Ambulatory Visit: Payer: Self-pay

## 2024-10-06 NOTE — Telephone Encounter (Signed)
-----   Message from Clayborne Daring sent at 10/01/2024 12:24 PM EST ----- Call pt. If taking B 12, she needs to cut back on the dose or stop for now.  ----- Message ----- From: Interface, [facility] Lab Results In Sent: 10/01/2024   5:39 AM EST To: Clayborne DELENA Daring, DO

## 2024-10-06 NOTE — Telephone Encounter (Signed)
 Left message for patient to return call.

## 2024-10-07 ENCOUNTER — Ambulatory Visit: Admitting: Dermatology

## 2024-10-07 ENCOUNTER — Encounter: Payer: Self-pay | Admitting: Dermatology

## 2024-10-07 VITALS — BP 160/76 | HR 71

## 2024-10-07 DIAGNOSIS — L858 Other specified epidermal thickening: Secondary | ICD-10-CM

## 2024-10-07 DIAGNOSIS — L98499 Non-pressure chronic ulcer of skin of other sites with unspecified severity: Secondary | ICD-10-CM

## 2024-10-07 DIAGNOSIS — D485 Neoplasm of uncertain behavior of skin: Secondary | ICD-10-CM | POA: Diagnosis not present

## 2024-10-07 DIAGNOSIS — Z79899 Other long term (current) drug therapy: Secondary | ICD-10-CM

## 2024-10-07 DIAGNOSIS — L409 Psoriasis, unspecified: Secondary | ICD-10-CM

## 2024-10-07 NOTE — Patient Instructions (Signed)

## 2024-10-07 NOTE — Progress Notes (Signed)
   Follow-Up Visit   Subjective  Brianna Lambert is a 68 y.o. female who presents for the following:   Patient present today for follow up visit for psoriasis follow up. Patient was last evaluated on 04/03/2024. Patient has been using Skyrizi  for her psoriasis. Patient states that she has a few spots on her scalp she is concerned about, but otherwise clear. She notes that it has improved her joints.  Patient reports sxs are better.   The following portions of the chart were reviewed this encounter and updated as appropriate: medications, allergies, medical history  Review of Systems:  No other skin or systemic complaints except as noted in HPI or Assessment and Plan.  Objective  Well appearing patient in no apparent distress; mood and affect are within normal limits.  A focused examination was performed of the following areas: Scalp Face  Relevant exam findings are noted in the Assessment and Plan.  Right lower cheek 5 mm hemorrhagic crusted papule  Scalp 6 mm hemorrhagic crusted papule   Assessment & Plan   PSORIASIS- much improved Exam: Clear  Treatment Plan: She is doing well on Skyrizi . Continue Skyrizi  injections every 3 months.   NEOPLASM OF UNCERTAIN BEHAVIOR OF SKIN (2) Right lower cheek Skin / nail biopsy Type of biopsy: tangential   Informed consent: discussed and consent obtained   Timeout: patient name, date of birth, surgical site, and procedure verified   Procedure prep:  Patient was prepped and draped in usual sterile fashion Prep type:  Isopropyl alcohol Anesthesia: the lesion was anesthetized in a standard fashion   Anesthetic:  1% lidocaine  w/ epinephrine  1-100,000 buffered w/ 8.4% NaHCO3 Instrument used: flexible razor blade   Hemostasis achieved with: pressure, aluminum chloride and electrodesiccation   Outcome: patient tolerated procedure well   Post-procedure details: sterile dressing applied and wound care instructions given   Dressing  type: bandage and petrolatum    Specimen 1 - Surgical pathology Differential Diagnosis: NMSC vs other   Check Margins: No Scalp Skin / nail biopsy Type of biopsy: tangential   Informed consent: discussed and consent obtained   Timeout: patient name, date of birth, surgical site, and procedure verified   Procedure prep:  Patient was prepped and draped in usual sterile fashion Prep type:  Isopropyl alcohol Anesthesia: the lesion was anesthetized in a standard fashion   Anesthetic:  1% lidocaine  w/ epinephrine  1-100,000 buffered w/ 8.4% NaHCO3 Instrument used: flexible razor blade   Hemostasis achieved with: pressure, aluminum chloride and electrodesiccation   Outcome: patient tolerated procedure well   Post-procedure details: sterile dressing applied and wound care instructions given   Dressing type: bandage and petrolatum    Specimen 2 - Surgical pathology Differential Diagnosis: NMSC vs other   Check Margins: No  Return in about 6 months (around 04/06/2025) for Psoriasis.  LILLETTE Rollene Gobble, RN, am acting as scribe for RUFUS CHRISTELLA HOLY, MD .   Documentation: I have reviewed the above documentation for accuracy and completeness, and I agree with the above.  RUFUS CHRISTELLA HOLY, MD

## 2024-10-09 LAB — SURGICAL PATHOLOGY

## 2024-10-12 ENCOUNTER — Ambulatory Visit: Payer: Self-pay | Admitting: Dermatology

## 2024-10-21 ENCOUNTER — Encounter: Payer: Self-pay | Admitting: Bariatrics

## 2024-10-21 ENCOUNTER — Ambulatory Visit: Admitting: Bariatrics

## 2024-10-21 VITALS — BP 123/70 | HR 58 | Ht 64.5 in | Wt 249.0 lb

## 2024-10-21 DIAGNOSIS — E559 Vitamin D deficiency, unspecified: Secondary | ICD-10-CM | POA: Diagnosis not present

## 2024-10-21 DIAGNOSIS — Z7984 Long term (current) use of oral hypoglycemic drugs: Secondary | ICD-10-CM

## 2024-10-21 DIAGNOSIS — Z6841 Body Mass Index (BMI) 40.0 and over, adult: Secondary | ICD-10-CM | POA: Diagnosis not present

## 2024-10-21 DIAGNOSIS — E1165 Type 2 diabetes mellitus with hyperglycemia: Secondary | ICD-10-CM | POA: Diagnosis not present

## 2024-10-21 DIAGNOSIS — Z7985 Long-term (current) use of injectable non-insulin antidiabetic drugs: Secondary | ICD-10-CM

## 2024-10-21 MED ORDER — VITAMIN D (ERGOCALCIFEROL) 1.25 MG (50000 UNIT) PO CAPS: 50000.0000 [IU] | ORAL_CAPSULE | ORAL | 0 refills | Status: AC

## 2024-10-21 NOTE — Progress Notes (Signed)
 First follow-up after initial visit.        WEIGHT SUMMARY AND BIOMETRICS  Weight Lost Since Last Visit: 9lb  Weight Gained Since Last Visit: 0   Vitals BP: 123/70 Pulse Rate: (!) 58 SpO2: 99 %   Anthropometric Measurements Height: 5' 4.5 (1.638 m) Weight: 249 lb (112.9 kg) BMI (Calculated): 42.1 Weight at Last Visit: 258lb Weight Lost Since Last Visit: 9lb Weight Gained Since Last Visit: 0 Starting Weight: 258lb Total Weight Loss (lbs): 9 lb (4.082 kg) Peak Weight: 270lb   Body Composition  Body Fat %: 51.2 % Fat Mass (lbs): 127.6 lbs Muscle Mass (lbs): 115.4 lbs Total Body Water (lbs): 89.2 lbs Visceral Fat Rating : 18   Other Clinical Data Fasting: no Labs: no Today's Visit #: 2 Starting Date: 09/30/24    OBESITY Brianna Lambert is here to discuss her progress with her obesity treatment plan along with follow-up of her obesity related diagnoses.    Nutrition Plan: the Category 2 plan - 99% adherence.  Current exercise: Walking  Interim History:  She is down 9 lbs since her last visit. Eating all of the food on the plan., Protein intake is as prescribed, Is not skipping meals, and Water intake is adequate.  Initial positives regarding the dietary plan:  Initial challenges regarding  the dietary plan:   Pharmacotherapy: Brianna Lambert is on Metformin  XR 750 mg  Adverse side effects: None Hunger is well controlled.  Cravings are moderately controlled.  Assessment/Plan:   Type II Diabetes HgbA1c is not at goal. Last A1c was 7.6 CBGs: Not checking      Episodes of hypoglycemia: no Medication(s):   Lab Results  Component Value Date   HGBA1C 7.6 (H) 09/09/2024   HGBA1C 8.2 (H) 03/13/2024   HGBA1C 7.8 (H) 06/25/2023   Lab Results  Component Value Date   MICROALBUR <0.7 03/13/2024   LDLCALC 87 09/03/2024   CREATININE 0.87 09/30/2024   Lab  Results  Component Value Date   GFR 65.38 09/09/2024   GFR 75.57 03/13/2024   GFR 65.94 06/25/2023    Plan: Continue Metformin  and Ozempic  at the current dose (may increase in the future).  Will keep all carbohydrates low both sweets and starches.  Will continue exercise regimen to 30 to 60 minutes on most days of the week.  Aim for 7 to 9 hours of sleep nightly.  Eat more low glycemic index foods.   Vitamin D  Deficiency Vitamin D  is not at goal of 50.  Most recent vitamin D  level was 30.4. She is not on vitamin D .  Lab Results  Component Value Date   VD25OH 30.4 09/30/2024   VD25OH 41.99 10/24/2022   VD25OH 52.33 09/15/2021    Plan: Resume prescription vitamin D  50,000 IU weekly # 12 with no refills.  Will recheck vitamin D  in the future.     Morbid Obesity: Current BMI BMI (Calculated): 42.1   Pharmacotherapy Plan Continue  Metformin  and Ozempic .   Brianna Lambert is currently in the action stage of change. As such, her goal is to continue with weight loss efforts.  She has agreed to the Category 2 plan.  Exercise goals: Older adults should determine their level of effort for physical activity relative to their level of fitness.   Behavioral modification strategies: increasing lean protein intake, no meal skipping, meal planning , better snacking choices, planning for success, increasing vegetables, avoiding temptations, and keep healthy foods in the home.  Brianna Lambert has agreed to follow-up with our clinic in 4  weeks.   Labs reviewed today from last visit (CMP, insulin , vitamin D , B 12, and glucose).   Objective:   VITALS: Per patient if applicable, see vitals. GENERAL: Alert and in no acute distress. CARDIOPULMONARY: No increased WOB. Speaking in clear sentences.  PSYCH: Pleasant and cooperative. Speech normal rate and rhythm. Affect is appropriate. Insight and judgement are appropriate. Attention is focused, linear, and appropriate.  NEURO: Oriented as arrived to appointment on  time with no prompting.   Attestation Statements:   This was prepared with the assistance of Engineer, Civil (consulting).  Occasional wrong-word or sound-a-like substitutions may have occurred due to the inherent limitations of voice recognition software.   Clayborne Daring, DO

## 2024-11-05 ENCOUNTER — Other Ambulatory Visit: Payer: Self-pay | Admitting: Internal Medicine

## 2024-11-05 DIAGNOSIS — E118 Type 2 diabetes mellitus with unspecified complications: Secondary | ICD-10-CM

## 2024-11-10 NOTE — Telephone Encounter (Signed)
Mychart message to patient to follow up.

## 2024-11-12 ENCOUNTER — Encounter: Payer: Self-pay | Admitting: Bariatrics

## 2024-11-12 ENCOUNTER — Ambulatory Visit: Admitting: Bariatrics

## 2024-11-12 VITALS — BP 103/65 | HR 57 | Temp 97.8°F | Ht 64.5 in | Wt 241.0 lb

## 2024-11-12 DIAGNOSIS — Z6841 Body Mass Index (BMI) 40.0 and over, adult: Secondary | ICD-10-CM

## 2024-11-12 DIAGNOSIS — Z7984 Long term (current) use of oral hypoglycemic drugs: Secondary | ICD-10-CM | POA: Diagnosis not present

## 2024-11-12 DIAGNOSIS — E119 Type 2 diabetes mellitus without complications: Secondary | ICD-10-CM | POA: Diagnosis not present

## 2024-11-12 DIAGNOSIS — Z7985 Long-term (current) use of injectable non-insulin antidiabetic drugs: Secondary | ICD-10-CM

## 2024-11-12 DIAGNOSIS — E1165 Type 2 diabetes mellitus with hyperglycemia: Secondary | ICD-10-CM

## 2024-11-12 NOTE — Progress Notes (Signed)
 "                                                                                                             WEIGHT SUMMARY AND BIOMETRICS  Weight Lost Since Last Visit: 8lb  Weight Gained Since Last Visit: 0lb   Vitals Temp: 97.8 F (36.6 C) BP: 103/65 Pulse Rate: (!) 57 SpO2: 98 %   Anthropometric Measurements Height: 5' 4.5 (1.638 m) Weight: 241 lb (109.3 kg) BMI (Calculated): 40.74 Weight at Last Visit: 249lb Weight Lost Since Last Visit: 8lb Weight Gained Since Last Visit: 0lb Starting Weight: 258lb Total Weight Loss (lbs): 17 lb (7.711 kg) Peak Weight: 270lb   Body Composition  Body Fat %: 50.7 % Fat Mass (lbs): 122.6 lbs Muscle Mass (lbs): 113.2 lbs Total Body Water (lbs): 88 lbs Visceral Fat Rating : 17   Other Clinical Data Fasting: no Labs: no Today's Visit #: 3 Starting Date: 09/30/24    OBESITY Raiden is here to discuss her progress with her obesity treatment plan along with follow-up of her obesity related diagnoses.    Nutrition Plan: the Category 2 plan - 90% adherence.  Current exercise: none  Interim History:  She is down 8 lbs since her last visit. She is eating fewer carbohydrates.  Eating all of the food on the plan., Protein intake is as prescribed, Not journaling consistently., and Water intake is adequate.   Pharmacotherapy: Moneka is on Metformin  -XR 750 mg, Ozempic  0.25 mg weekly Adverse side effects: None Hunger is well controlled.  Cravings are moderately controlled.  Assessment/Plan:   Type II Diabetes HgbA1c is not at goal. Last A1c was 7.6.  CBGs: Not checking      Episodes of hypoglycemia: no Medication(s): Ozempic  0.25 mg SQ weekly and Metformin  XR 750 mg  Lab Results  Component Value Date   HGBA1C 7.6 (H) 09/09/2024   HGBA1C 8.2 (H) 03/13/2024   HGBA1C 7.8 (H) 06/25/2023   Lab Results  Component Value Date   MICROALBUR <0.7 03/13/2024   LDLCALC 87 09/03/2024   CREATININE 0.87 09/30/2024   Lab Results   Component Value Date   GFR 65.38 09/09/2024   GFR 75.57 03/13/2024   GFR 65.94 06/25/2023    Plan: Continue Ozempic  0.25 mg SQ weekly, and Metformin .  Continue all other medications.  Will keep all carbohydrates low both sweets and starches.  Will continue exercise regimen to 30 to 60 minutes on most days of the week.  Aim for 7 to 9 hours of sleep nightly.  Eat more low glycemic index foods.  Will continue to cook more at home.  Will focus on healthy snacks.    Morbid Obesity: Current BMI BMI (Calculated): 40.74   Pharmacotherapy Plan Continue  Ozempic  0.25 mg SQ weeklyand Metformin .   Aleeza is currently in the action stage of change. As such, her goal is to continue with weight loss efforts.  She has agreed to the Category 2 plan.  Exercise goals: Older adults should follow the adult guidelines. When older adults cannot meet  the adult guidelines, they should be as physically active as their abilities and conditions will allow.  She is more active with taking care of her granddaughter.   Behavioral modification strategies: no meal skipping, meal planning , increase water intake, better snacking choices, planning for success, increasing vegetables, decrease snacking , keep healthy foods in the home, and measure portion sizes.  Magin has agreed to follow-up with our clinic in 4 weeks.   Objective:   VITALS: Per patient if applicable, see vitals. GENERAL: Alert and in no acute distress. CARDIOPULMONARY: No increased WOB. Speaking in clear sentences.  PSYCH: Pleasant and cooperative. Speech normal rate and rhythm. Affect is appropriate. Insight and judgement are appropriate. Attention is focused, linear, and appropriate.  NEURO: Oriented as arrived to appointment on time with no prompting.   Attestation Statements:   This was prepared with the assistance of Engineer, Civil (consulting).  Occasional wrong-word or sound-a-like substitutions may have occurred due to the inherent  limitations of voice recognition.   Clayborne Daring, DO     "

## 2024-12-06 ENCOUNTER — Other Ambulatory Visit: Payer: Self-pay | Admitting: Internal Medicine

## 2024-12-06 DIAGNOSIS — M150 Primary generalized (osteo)arthritis: Secondary | ICD-10-CM

## 2024-12-10 ENCOUNTER — Encounter: Payer: Self-pay | Admitting: Bariatrics

## 2024-12-10 ENCOUNTER — Ambulatory Visit: Admitting: Bariatrics

## 2024-12-10 VITALS — BP 125/75 | HR 78 | Ht 64.5 in | Wt 240.0 lb

## 2024-12-10 DIAGNOSIS — E119 Type 2 diabetes mellitus without complications: Secondary | ICD-10-CM | POA: Diagnosis not present

## 2024-12-10 DIAGNOSIS — E1165 Type 2 diabetes mellitus with hyperglycemia: Secondary | ICD-10-CM

## 2024-12-10 DIAGNOSIS — Z6841 Body Mass Index (BMI) 40.0 and over, adult: Secondary | ICD-10-CM | POA: Diagnosis not present

## 2024-12-10 DIAGNOSIS — Z7985 Long-term (current) use of injectable non-insulin antidiabetic drugs: Secondary | ICD-10-CM | POA: Diagnosis not present

## 2024-12-10 NOTE — Progress Notes (Signed)
 "                                                                                                             WEIGHT SUMMARY AND BIOMETRICS  Weight Lost Since Last Visit: 1lb  Weight Gained Since Last Visit: 0   Vitals BP: 125/75 Pulse Rate: 78 SpO2: 98 %   Anthropometric Measurements Height: 5' 4.5 (1.638 m) Weight: 240 lb (108.9 kg) BMI (Calculated): 40.57 Weight at Last Visit: 241lb Weight Lost Since Last Visit: 1lb Weight Gained Since Last Visit: 0 Starting Weight: 258lb Total Weight Loss (lbs): 18 lb (8.165 kg) Peak Weight: 270lb   Body Composition  Body Fat %: 51.2 % Fat Mass (lbs): 123 lbs Muscle Mass (lbs): 111.2 lbs Total Body Water (lbs): 90.8 lbs Visceral Fat Rating : 17   Other Clinical Data Fasting: no Labs: no Today's Visit #: 4 Starting Date: 09/30/24    OBESITY Brianna Lambert is here to discuss her progress with her obesity treatment plan along with follow-up of her obesity related diagnoses.    Nutrition Plan: the Category 2 plan - 80% adherence.  Current exercise: walking  Interim History:  She is down 1 lb since her last visit.  Eating all of the food on the plan., Protein intake is as prescribed, and Water intake is adequate.   Pharmacotherapy: Brianna Lambert is on Ozempic  0.25 mg SQ weekly Adverse side effects: None Hunger is moderately controlled.  Cravings are moderately controlled.  Assessment/Plan:   Type II Diabetes HgbA1c is not at goal. Last A1c was 7.6 CBGs: Not checking      Episodes of hypoglycemia: no Medication(s): Ozempic  0.25 mg SQ weekly  Lab Results  Component Value Date   HGBA1C 7.6 (H) 09/09/2024   HGBA1C 8.2 (H) 03/13/2024   HGBA1C 7.8 (H) 06/25/2023   Lab Results  Component Value Date   MICROALBUR <0.7 03/13/2024   LDLCALC 87 09/03/2024   CREATININE 0.87 09/30/2024   Lab Results  Component Value Date   GFR 65.38 09/09/2024   GFR 75.57 03/13/2024   GFR 65.94 06/25/2023    Plan:  Ozempic  0.25 mg SQ  weekly Will keep all carbohydrates low both sweets and starches.  Will increase her exercise both for cardio and resistance we discussed having some small weights at home and using some online videos sites. Soup recipes given and discussed.  Will send a message to Dr. Debby Molt, who is her PCP.  I would like to increase her Ozempic  for better weight loss and to improve her hemoglobin A1c even more.   Morbid Obesity: Current BMI BMI (Calculated): 40.57   Pharmacotherapy Plan Continue  Ozempic  0.25 mg SQ weekly  Brianna Lambert is currently in the action stage of change. As such, her goal is to continue with weight loss efforts.  She has agreed to the Category 2 plan.  Exercise goals: Older adults should do exercises that maintain or improve balance if they are at risk of falling.  She wants to walk more. Will use some on-line and weights.  Behavioral modification strategies: increasing lean protein intake, decreasing simple carbohydrates , no meal skipping, meal planning , increase water intake, better snacking choices, planning for success, increasing vegetables, increasing fiber rich foods, decrease snacking , avoiding temptations, keep healthy foods in the home, and increase frequency of journaling.  Brianna Lambert has agreed to follow-up with our clinic in 2 weeks.      Objective:   VITALS: Per patient if applicable, see vitals. GENERAL: Alert and in no acute distress. CARDIOPULMONARY: No increased WOB. Speaking in clear sentences.  PSYCH: Pleasant and cooperative. Speech normal rate and rhythm. Affect is appropriate. Insight and judgement are appropriate. Attention is focused, linear, and appropriate.  NEURO: Oriented as arrived to appointment on time with no prompting.   Attestation Statements:   This was prepared with the assistance of Engineer, Civil (consulting).  Occasional wrong-word or sound-a-like substitutions may have occurred due to the inherent limitations of voice recognition.   Clayborne Daring, DO'   "

## 2024-12-24 ENCOUNTER — Other Ambulatory Visit: Payer: Self-pay | Admitting: Internal Medicine

## 2024-12-24 DIAGNOSIS — E118 Type 2 diabetes mellitus with unspecified complications: Secondary | ICD-10-CM

## 2024-12-25 ENCOUNTER — Other Ambulatory Visit: Payer: Self-pay | Admitting: Medical Genetics

## 2024-12-25 DIAGNOSIS — Z006 Encounter for examination for normal comparison and control in clinical research program: Secondary | ICD-10-CM

## 2025-01-14 ENCOUNTER — Ambulatory Visit: Admitting: Bariatrics

## 2025-04-06 ENCOUNTER — Ambulatory Visit: Admitting: Dermatology
